# Patient Record
Sex: Female | Born: 1965 | Hispanic: No | Marital: Married | Smoking: Never smoker
Health system: Southern US, Community
[De-identification: ages and names within clinical notes are randomized; demographics above are authoritative.]

## PROBLEM LIST (undated history)

## (undated) DIAGNOSIS — N6459 Other signs and symptoms in breast: Secondary | ICD-10-CM

## (undated) DIAGNOSIS — E7800 Pure hypercholesterolemia, unspecified: Secondary | ICD-10-CM

## (undated) DIAGNOSIS — M1990 Unspecified osteoarthritis, unspecified site: Secondary | ICD-10-CM

## (undated) HISTORY — PX: KNEE SURGERY: SHX244

---

## 2005-08-03 ENCOUNTER — Inpatient Hospital Stay (HOSPITAL_COMMUNITY): Admission: AD | Admit: 2005-08-03 | Discharge: 2005-08-03 | Payer: Self-pay | Admitting: Obstetrics and Gynecology

## 2006-01-06 ENCOUNTER — Ambulatory Visit (HOSPITAL_COMMUNITY): Admission: RE | Admit: 2006-01-06 | Discharge: 2006-01-06 | Payer: Self-pay | Admitting: Obstetrics & Gynecology

## 2006-03-01 ENCOUNTER — Inpatient Hospital Stay (HOSPITAL_COMMUNITY): Admission: AD | Admit: 2006-03-01 | Discharge: 2006-03-05 | Payer: Self-pay | Admitting: *Deleted

## 2006-09-22 ENCOUNTER — Emergency Department (HOSPITAL_COMMUNITY): Admission: EM | Admit: 2006-09-22 | Discharge: 2006-09-22 | Payer: Self-pay | Admitting: Family Medicine

## 2006-09-30 ENCOUNTER — Emergency Department (HOSPITAL_COMMUNITY): Admission: EM | Admit: 2006-09-30 | Discharge: 2006-09-30 | Payer: Self-pay | Admitting: Emergency Medicine

## 2006-10-21 ENCOUNTER — Ambulatory Visit (HOSPITAL_COMMUNITY): Admission: RE | Admit: 2006-10-21 | Discharge: 2006-10-21 | Payer: Self-pay | Admitting: Family Medicine

## 2007-01-27 ENCOUNTER — Emergency Department (HOSPITAL_COMMUNITY): Admission: EM | Admit: 2007-01-27 | Discharge: 2007-01-28 | Payer: Self-pay | Admitting: Emergency Medicine

## 2007-07-18 ENCOUNTER — Emergency Department (HOSPITAL_COMMUNITY): Admission: EM | Admit: 2007-07-18 | Discharge: 2007-07-18 | Payer: Self-pay | Admitting: Emergency Medicine

## 2008-04-04 ENCOUNTER — Ambulatory Visit (HOSPITAL_BASED_OUTPATIENT_CLINIC_OR_DEPARTMENT_OTHER): Admission: RE | Admit: 2008-04-04 | Discharge: 2008-04-04 | Payer: Self-pay | Admitting: Orthopaedic Surgery

## 2008-05-02 ENCOUNTER — Ambulatory Visit: Payer: Self-pay | Admitting: Internal Medicine

## 2008-05-02 ENCOUNTER — Ambulatory Visit: Payer: Self-pay | Admitting: *Deleted

## 2008-05-04 ENCOUNTER — Encounter: Admission: RE | Admit: 2008-05-04 | Discharge: 2008-06-14 | Payer: Self-pay | Admitting: Orthopaedic Surgery

## 2008-09-19 ENCOUNTER — Emergency Department (HOSPITAL_COMMUNITY): Admission: EM | Admit: 2008-09-19 | Discharge: 2008-09-19 | Payer: Self-pay | Admitting: Emergency Medicine

## 2009-02-11 ENCOUNTER — Ambulatory Visit: Payer: Self-pay | Admitting: Internal Medicine

## 2009-04-04 ENCOUNTER — Ambulatory Visit: Payer: Self-pay | Admitting: Internal Medicine

## 2009-04-12 ENCOUNTER — Ambulatory Visit: Payer: Self-pay | Admitting: Internal Medicine

## 2009-04-18 ENCOUNTER — Ambulatory Visit (HOSPITAL_COMMUNITY): Admission: RE | Admit: 2009-04-18 | Discharge: 2009-04-18 | Payer: Self-pay | Admitting: Family Medicine

## 2009-04-26 ENCOUNTER — Encounter: Admission: RE | Admit: 2009-04-26 | Discharge: 2009-04-26 | Payer: Self-pay | Admitting: Family Medicine

## 2009-07-10 ENCOUNTER — Ambulatory Visit: Payer: Self-pay | Admitting: Internal Medicine

## 2009-07-10 ENCOUNTER — Encounter: Payer: Self-pay | Admitting: Family Medicine

## 2009-07-10 LAB — CONVERTED CEMR LAB
ALT: 14 units/L (ref 0–35)
AST: 17 units/L (ref 0–37)
Albumin: 4.7 g/dL (ref 3.5–5.2)
Alkaline Phosphatase: 67 units/L (ref 39–117)
BUN: 14 mg/dL (ref 6–23)
CO2: 22 meq/L (ref 19–32)
Calcium: 9.1 mg/dL (ref 8.4–10.5)
Chloride: 103 meq/L (ref 96–112)
Cholesterol: 214 mg/dL — ABNORMAL HIGH (ref 0–200)
Creatinine, Ser: 0.63 mg/dL (ref 0.40–1.20)
Glucose, Bld: 81 mg/dL (ref 70–99)
HDL: 38 mg/dL — ABNORMAL LOW (ref >39)
LDL Cholesterol: 139 mg/dL — ABNORMAL HIGH (ref 0–99)
Potassium: 4.1 meq/L (ref 3.5–5.3)
Sodium: 139 meq/L (ref 135–145)
Total Bilirubin: 0.6 mg/dL (ref 0.3–1.2)
Total CHOL/HDL Ratio: 5.6
Total Protein: 7.7 g/dL (ref 6.0–8.3)
Triglycerides: 184 mg/dL — ABNORMAL HIGH (ref <150)
VLDL: 37 mg/dL (ref 0–40)

## 2009-07-17 ENCOUNTER — Ambulatory Visit: Payer: Self-pay | Admitting: Internal Medicine

## 2009-07-17 ENCOUNTER — Encounter: Payer: Self-pay | Admitting: Family Medicine

## 2009-07-17 LAB — CONVERTED CEMR LAB
Chlamydia, DNA Probe: NEGATIVE
GC Probe Amp, Genital: NEGATIVE

## 2009-10-17 ENCOUNTER — Encounter: Payer: Self-pay | Admitting: Internal Medicine

## 2009-10-17 ENCOUNTER — Ambulatory Visit: Payer: Self-pay | Admitting: Internal Medicine

## 2009-10-17 LAB — CONVERTED CEMR LAB
Cholesterol: 197 mg/dL (ref 0–200)
HDL: 35 mg/dL — ABNORMAL LOW (ref >39)
LDL Cholesterol: 132 mg/dL — ABNORMAL HIGH (ref 0–99)
Total CHOL/HDL Ratio: 5.6
Triglycerides: 151 mg/dL — ABNORMAL HIGH (ref <150)
VLDL: 30 mg/dL (ref 0–40)

## 2009-10-30 ENCOUNTER — Encounter: Admission: RE | Admit: 2009-10-30 | Discharge: 2009-10-30 | Payer: Self-pay | Admitting: Family Medicine

## 2009-11-12 ENCOUNTER — Ambulatory Visit: Payer: Self-pay | Admitting: Family Medicine

## 2009-12-24 ENCOUNTER — Telehealth (INDEPENDENT_AMBULATORY_CARE_PROVIDER_SITE_OTHER): Payer: Self-pay | Admitting: *Deleted

## 2009-12-25 ENCOUNTER — Ambulatory Visit: Payer: Self-pay | Admitting: Internal Medicine

## 2010-01-17 ENCOUNTER — Telehealth (INDEPENDENT_AMBULATORY_CARE_PROVIDER_SITE_OTHER): Payer: Self-pay | Admitting: *Deleted

## 2010-01-20 ENCOUNTER — Ambulatory Visit: Payer: Self-pay | Admitting: Internal Medicine

## 2010-02-11 ENCOUNTER — Ambulatory Visit: Payer: Self-pay | Admitting: Internal Medicine

## 2010-02-11 LAB — CONVERTED CEMR LAB: Direct LDL: 135 mg/dL — ABNORMAL HIGH

## 2010-06-19 ENCOUNTER — Encounter: Admission: RE | Admit: 2010-06-19 | Discharge: 2010-06-19 | Payer: Self-pay | Admitting: Family Medicine

## 2010-07-10 ENCOUNTER — Encounter (INDEPENDENT_AMBULATORY_CARE_PROVIDER_SITE_OTHER): Payer: Self-pay | Admitting: Family Medicine

## 2010-07-10 LAB — CONVERTED CEMR LAB
Free T4: 1.34 ng/dL (ref 0.80–1.80)
Sed Rate: 10 mm/hr (ref 0–22)
TSH: 1.189 microintl units/mL (ref 0.350–4.500)
Vit D, 25-Hydroxy: 17 ng/mL — ABNORMAL LOW (ref 30–89)

## 2010-11-25 NOTE — Progress Notes (Signed)
Summary: triage/diarrhea  Phone Note Call from Patient   Reason for Call: Talk to Nurse Summary of Call: patient states has been having watery diarrhea 2-3 times a day since starting Z pak on 3/2.Marland Kitchen.(3 weeks)...she has tried Immodium and it has not helped.Marland KitchenMarland KitchenPatient states her stomach is bloated..She denies dizziness or other signs of dehydration.Marland KitchenMarland KitchenShe denies fever, blood or pus in stool.Marland KitchenMarland KitchenAdvised patient to drink plenty of water and that if she is experiencing any dizziness or weakness to have someone take her to ED.Marland KitchenAppointment made in Dr. Paticia Stack schedule for Monday... Initial call taken by: Conchita Paris,  January 17, 2010 11:32 AM

## 2010-11-25 NOTE — Progress Notes (Signed)
Summary: triage/cough sob 1 week  Phone Note Call from Patient   Caller: Patient Reason for Call: Talk to Nurse Summary of Call: patient states she has been sick with the flu for 1 week and her cough is worse..She states the cough is non productive and her chest hurts all the time..She Doesn't think she has a fever..She says she has difficulty breathing.Marland KitchenMarland KitchenShe is obese and denies DM, HTN or other problems.Marland KitchenMarland KitchenShe  is not speaking in full sentences and is coughing while on phone.Marland KitchenMarland KitchenAdvised patient she can go to urgent care this evening if needed.Marland KitchenMarland KitchenAppointment made with Dr. Reche Dixon tomorrow.Marland Kitchen

## 2010-12-19 ENCOUNTER — Other Ambulatory Visit (HOSPITAL_COMMUNITY)
Admission: RE | Admit: 2010-12-19 | Discharge: 2010-12-19 | Disposition: A | Discharge status: Home / Self Care | Payer: 59 | Source: Ambulatory Visit | Attending: Family Medicine | Admitting: Family Medicine

## 2010-12-19 ENCOUNTER — Other Ambulatory Visit: Payer: Self-pay | Admitting: Family Medicine

## 2010-12-19 DIAGNOSIS — Z01419 Encounter for gynecological examination (general) (routine) without abnormal findings: Secondary | ICD-10-CM | POA: Insufficient documentation

## 2011-03-10 NOTE — Op Note (Signed)
Cheryl Bush, Cheryl Bush              ACCOUNT NO.:  [id number]   MEDICAL RECORD NO.:  [id number]          PATIENT TYPE:  AMB   LOCATION:  [facility]SC                          FACILITY:  [facility]   PHYSICIAN:  Mark C. Ophelia Charter, M.D.    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   PROCEDURES:  Left knee arthroscopy, exam under anesthesia, partial  medial meniscectomy.  Medial compartment chondroplasty, patellar  chondral debridement.   SURGEON:  Mark C. Ophelia Charter, MD   ANESTHESIA:  MAC plus 20 mL local.   PROCEDURE:  After induction of anesthesia, proximal thigh tourniquet and  leg holder, a standard prep and drape, in usual impervious stockinette,  Coban and arthroscopic sheets and drapes were applied, and placed  through superolateral portal.  After time-out procedure, check list was  completed.  Insufflation with the pump at 70.  Medial and lateral  parapatellar tendon portals were used.  Exam of the knee under  anesthesia demonstrate normal cruciate ligaments, collateral ligaments  were stable, normal patellar tracking.  The patellofemoral joint was  inspected.  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  Trochlear groove looked good.  Lateral  compartment was pristine.  Medial compartment showed degeneration of the  medial femoral condyle with grade 3 chondromalacia more prominent and  full extension, grade 2-3 changes on the tibial side and a tear of the  posterior meniscus with undersurface tear with a flat piece.  This was  painful with palpation for some initial anesthetic was given.  Using  combination of small straight flat baskets and a shaver,  partial medial  meniscectomy was performed.  Rim was stable.  Photographs were taken.  ACL and PCL was normal.  Lateral compartment was pristine.   ASSESSMENT:  Degenerative medial compartment with medial meniscal tear.  Knee was suctioned dry.  Tincture of benzoin, Steri-Strips, Tegaderm, 4  x 4's, ABD, Webril, and 6-inch Ace wrap x2 was applied.  Outpatient  surgery is appropriate for treatment of this condition.  Office follow  up in 1 week.      Mark C. Ophelia Charter, M.D.  Electronically Signed     MCY/MEDQ  D:  04/04/2008  T:  04/05/2008  Job:  [id number]

## 2011-07-23 LAB — POCT HEMOGLOBIN-HEMACUE: Hemoglobin: 14.5

## 2012-01-25 ENCOUNTER — Other Ambulatory Visit: Payer: Self-pay | Admitting: Family Medicine

## 2012-01-25 DIAGNOSIS — Z1231 Encounter for screening mammogram for malignant neoplasm of breast: Secondary | ICD-10-CM

## 2012-02-08 ENCOUNTER — Ambulatory Visit
Admission: RE | Admit: 2012-02-08 | Discharge: 2012-02-08 | Disposition: A | Discharge status: Home / Self Care | Payer: 59 | Source: Ambulatory Visit | Attending: Family Medicine | Admitting: Family Medicine

## 2012-02-08 DIAGNOSIS — Z1231 Encounter for screening mammogram for malignant neoplasm of breast: Secondary | ICD-10-CM

## 2012-08-11 ENCOUNTER — Other Ambulatory Visit: Payer: Self-pay | Admitting: Obstetrics & Gynecology

## 2012-08-11 DIAGNOSIS — E28319 Asymptomatic premature menopause: Secondary | ICD-10-CM

## 2012-08-17 ENCOUNTER — Ambulatory Visit (HOSPITAL_COMMUNITY): Admission: RE | Admit: 2012-08-17 | Payer: 59 | Source: Ambulatory Visit

## 2012-09-02 ENCOUNTER — Ambulatory Visit (HOSPITAL_COMMUNITY)
Admission: RE | Admit: 2012-09-02 | Discharge: 2012-09-02 | Disposition: A | Discharge status: Home / Self Care | Payer: 59 | Source: Ambulatory Visit | Attending: Obstetrics & Gynecology | Admitting: Obstetrics & Gynecology

## 2012-09-02 ENCOUNTER — Ambulatory Visit (HOSPITAL_COMMUNITY): Admission: RE | Admit: 2012-09-02 | Payer: 59 | Source: Ambulatory Visit

## 2012-09-02 DIAGNOSIS — Z13820 Encounter for screening for osteoporosis: Secondary | ICD-10-CM | POA: Insufficient documentation

## 2012-09-02 DIAGNOSIS — E28319 Asymptomatic premature menopause: Secondary | ICD-10-CM | POA: Insufficient documentation

## 2013-03-07 ENCOUNTER — Other Ambulatory Visit: Payer: Self-pay

## 2013-03-12 ENCOUNTER — Emergency Department (HOSPITAL_COMMUNITY)
Admission: EM | Admit: 2013-03-12 | Discharge: 2013-03-12 | Disposition: A | Discharge status: Home / Self Care | Payer: 59 | Attending: Emergency Medicine | Admitting: Emergency Medicine

## 2013-03-12 ENCOUNTER — Encounter (HOSPITAL_COMMUNITY): Payer: Self-pay

## 2013-03-12 DIAGNOSIS — N611 Abscess of the breast and nipple: Secondary | ICD-10-CM

## 2013-03-12 DIAGNOSIS — N61 Mastitis without abscess: Secondary | ICD-10-CM | POA: Insufficient documentation

## 2013-03-12 DIAGNOSIS — E7800 Pure hypercholesterolemia, unspecified: Secondary | ICD-10-CM | POA: Insufficient documentation

## 2013-03-12 DIAGNOSIS — B379 Candidiasis, unspecified: Secondary | ICD-10-CM

## 2013-03-12 HISTORY — DX: Pure hypercholesterolemia, unspecified: E78.00

## 2013-03-12 LAB — BASIC METABOLIC PANEL
BUN: 13 mg/dL (ref 6–23)
Creatinine, Ser: 0.7 mg/dL (ref 0.50–1.10)
GFR calc Af Amer: >90 mL/min (ref >90)
GFR calc non Af Amer: >90 mL/min (ref >90)
Glucose, Bld: 122 mg/dL — ABNORMAL HIGH (ref 70–99)

## 2013-03-12 LAB — CBC WITH DIFFERENTIAL/PLATELET
Basophils Absolute: 0 10*3/uL (ref 0.0–0.1)
Eosinophils Absolute: 0.1 10*3/uL (ref 0.0–0.7)
HCT: 40.4 % (ref 36.0–46.0)
Lymphs Abs: 1 10*3/uL (ref 0.7–4.0)
MCH: 29.9 pg (ref 26.0–34.0)
MCHC: 35.9 g/dL (ref 30.0–36.0)
MCV: 83.3 fL (ref 78.0–100.0)
Monocytes Absolute: 0.4 10*3/uL (ref 0.1–1.0)
Neutro Abs: 1.7 10*3/uL (ref 1.7–7.7)
RDW: 12.2 % (ref 11.5–15.5)

## 2013-03-12 MED ORDER — FLUCONAZOLE 100 MG PO TABS: 100.0000 mg | ORAL_TABLET | Freq: Every day | ORAL | Status: AC

## 2013-03-12 MED ORDER — MORPHINE SULFATE 4 MG/ML IJ SOLN
4.0000 mg | Freq: Once | INTRAMUSCULAR | Status: AC
  Administered 2013-03-12: 4 mg via INTRAVENOUS
  Filled 2013-03-12: qty 1

## 2013-03-12 MED ORDER — HYDROCODONE-ACETAMINOPHEN 5-325 MG PO TABS: 1.0000 | ORAL_TABLET | ORAL | Status: DC | PRN

## 2013-03-12 MED ORDER — VANCOMYCIN HCL IN DEXTROSE 1-5 GM/200ML-% IV SOLN
1000.0000 mg | Freq: Once | INTRAVENOUS | Status: AC
  Administered 2013-03-12: 1000 mg via INTRAVENOUS
  Filled 2013-03-12: qty 200

## 2013-03-12 MED ORDER — SODIUM CHLORIDE 0.9 % IV SOLN
INTRAVENOUS | Status: DC
  Administered 2013-03-12: 17:00:00 via INTRAVENOUS

## 2013-03-12 MED ORDER — ONDANSETRON HCL 4 MG/2ML IJ SOLN
4.0000 mg | Freq: Once | INTRAMUSCULAR | Status: AC
  Administered 2013-03-12: 4 mg via INTRAVENOUS
  Filled 2013-03-12: qty 2

## 2013-03-12 NOTE — ED Notes (Signed)
MD-Dr. Pollyann Kennedy at bedside.

## 2013-03-12 NOTE — ED Provider Notes (Signed)
Medical screening examination/treatment/procedure(s) were performed by non-physician practitioner and as supervising physician I was immediately available for consultation/collaboration.   Jermisha Hoffart, MD 03/12/13 2341 

## 2013-03-12 NOTE — ED Notes (Signed)
[id number]<JY> Expected date:<BR> Expected time:<BR> Means of arrival:<BR> Comments:<BR> Hold for triage-private pain

## 2013-03-12 NOTE — Consult Note (Signed)
Reason for Consult:  Left breast abscess Referring Physician:  Dr. [name] is an 47 y.o. female from [place] originally.  She moved to the [place] in 2002. HPI: She developed a small area of swelling and inferior to the nipple areolar complex of the left breast 6 days ago. It began getting larger. She saw her primary care physician. She was started on Augmentin. Despite that, the area continued to enlarge. She subsequently was started on doxycycline. Today she had increasing pain with nausea. She presented to the emergency department for evaluation. Of note was that she tried to place a hot compress over the area to "kill the bacteria" and ended up getting a skin burn in the inferior aspect of the left breast because of that.  She has a history of breast cancer in some of her cousins.  Past Medical History  Diagnosis Date  . High cholesterol     Past Surgical History  Procedure Laterality Date  . Knee surgery      left    No family history on file.  Social History:  reports that she has never smoked. She does not have any smokeless tobacco history on file. She reports that she does not drink alcohol or use illicit drugs.  Allergies: No Known Allergies  Prior to Admission medications   Medication Sig Start Date End Date Taking? Authorizing Provider  amoxicillin-clavulanate (AUGMENTIN) 875-125 MG per tablet Take 1 tablet by mouth 2 (two) times daily. For 10 days   Yes Historical Provider, MD  doxycycline (VIBRAMYCIN) 100 MG capsule Take 100 mg by mouth 2 (two) times daily. For 10 days   Yes Historical Provider, MD     Results for orders placed during the hospital encounter of 03/12/13 (from the past 48 hour(s))  CBC WITH DIFFERENTIAL     Status: Abnormal (Preliminary result)   Collection Time    03/12/13  4:40 PM      Result Value Range   WBC 3.2 (*) 4.0 - 10.5 K/uL   RBC 4.85  3.87 - 5.11 MIL/uL   Hemoglobin 14.5  12.0 - 15.0 g/dL   HCT 16.1  09.6 - 04.5 %   MCV 83.3   78.0 - 100.0 fL   MCH 29.9  26.0 - 34.0 pg   MCHC 35.9  30.0 - 36.0 g/dL   RDW 40.9  81.1 - 91.4 %   Platelets 201  150 - 400 K/uL   Neutrophils Relative % PENDING  43 - 77 %   Neutro Abs PENDING  1.7 - 7.7 K/uL   Band Neutrophils PENDING  0 - 10 %   Lymphocytes Relative PENDING  12 - 46 %   Lymphs Abs PENDING  0.7 - 4.0 K/uL   Monocytes Relative PENDING  3 - 12 %   Monocytes Absolute PENDING  0.1 - 1.0 K/uL   Eosinophils Relative PENDING  0 - 5 %   Eosinophils Absolute PENDING  0.0 - 0.7 K/uL   Basophils Relative PENDING  0 - 1 %   Basophils Absolute PENDING  0.0 - 0.1 K/uL   WBC Morphology PENDING     RBC Morphology PENDING     Smear Review PENDING     nRBC PENDING  0 /100 WBC   Metamyelocytes Relative PENDING     Myelocytes PENDING     Promyelocytes Absolute PENDING     Blasts PENDING    BASIC METABOLIC PANEL     Status: Abnormal   Collection Time  03/12/13  4:40 PM      Result Value Range   Sodium 141  135 - 145 mEq/L   Potassium 4.0  3.5 - 5.1 mEq/L   Chloride 105  96 - 112 mEq/L   CO2 25  19 - 32 mEq/L   Glucose, Bld 122 (*) 70 - 99 mg/dL   BUN 13  6 - 23 mg/dL   Creatinine, Ser 2.95  0.50 - 1.10 mg/dL   Calcium 9.5  8.4 - 28.4 mg/dL   GFR calc non Af Amer >90  >90 mL/min   GFR calc Af Amer >90  >90 mL/min   Comment:            The eGFR has been calculated     using the CKD EPI equation.     This calculation has not been     validated in all clinical     situations.     eGFR's persistently     <90 mL/min signify     possible Chronic Kidney Disease.    No results found.  Review of Systems  Constitutional: Positive for fever and chills.  Gastrointestinal: Positive for nausea. Negative for abdominal pain.  Genitourinary:       She is having vaginal itching and a discharge since starting the antibiotic.   Blood pressure 157/94, pulse 106, temperature 98.8 F (37.1 C), temperature source Oral, resp. rate 18, last menstrual period 03/08/2013, SpO2  99.00%. Physical Exam  Constitutional: She appears well-developed and well-nourished. No distress.  Neck: Neck supple.  Respiratory:  Left breast-a second-degree burn is present measuring approximately 3-4 cm in the inferior aspect of the left breast. From the 3 to 6:00 position of the nipple areolar complex, and erythematous fluctuant area is palpable and is tender.  The right breast demonstrates no masses or suspicious skin changes.  Musculoskeletal:  No axillary or supraclavicular adenopathy.  Lymphadenopathy:    She has no cervical adenopathy.  Psychiatric: She has a normal mood and affect. Her behavior is normal.    Assessment/Plan: 1. Left breast abscess that has not responded to antibiotic therapy.  2. Fungal vaginitis likely related to antibiotic.  Plan: Incision and drainage of a left breast abscess. We'll have her get vancomycin x1 dose in the emergency department. Will have her continue the doxycycline at home. We'll give her a prescription for Diflucan as well as hydrocodone. We'll have her followup in the office in one to 2 weeks.  Cheryl Bush 03/12/2013, 5:36 PM

## 2013-03-12 NOTE — ED Notes (Addendum)
Pt states she is being treated for a breast infection (left side) with antiobiotics but it is getting worse.  States the breast infection began Monday (6 days ago).  Also c/o burning pain to vaginal area when she cleans herself in the shower.  Pt also states she used hot water to clean area and has a blister to the breast as well.

## 2013-03-12 NOTE — ED Provider Notes (Signed)
History     CSN: [id number]  Arrival date & time 03/12/13  1545   First MD Initiated Contact with Patient 03/12/13 1555      Chief Complaint  Patient presents with  . Breast Problem    (Consider location/radiation/quality/duration/timing/severity/associated sxs/prior treatment) HPI  Patient presents to the ED with complaints of left breast pain and vaginal burning. She developed a left breast infection on Monday and has tried two antibiotics written by her PCP at [facility] - Augmentin and Doxy. The infection continues to worsen and now she claims it grows and the pain worsens by the minute. The pain in the breast is severe. She says that this morning while taking a shower the warm water made her feel some burning in her vaginal region with a small amount of white discharge from the area. She endorses having some nausea and subjective fevers.   Past Medical History  Diagnosis Date  . High cholesterol     Past Surgical History  Procedure Laterality Date  . Knee surgery      left    No family history on file.  History  Substance Use Topics  . Smoking status: Never Smoker   . Smokeless tobacco: Not on file  . Alcohol Use: No    OB History   Grav Para Term Preterm Abortions TAB SAB Ect Mult Living                  Review of Systems  All other systems reviewed and are negative.    Allergies  Review of patient's allergies indicates no known allergies.  Home Medications   Current Outpatient Rx  Name  Route  Sig  Dispense  Refill  . doxycycline (VIBRAMYCIN) 100 MG capsule   Oral   Take 100 mg by mouth 2 (two) times daily. For 10 days         . fluconazole (DIFLUCAN) 100 MG tablet   Oral   Take 1 tablet (100 mg total) by mouth daily.   2 tablet   0   . HYDROcodone-acetaminophen (NORCO) 5-325 MG per tablet   Oral   Take 1-2 tablets by mouth every 4 (four) hours as needed for pain.   30 tablet   1     BP 157/94  Pulse 106  Temp(Src)  98.8 F (37.1 C) (Oral)  Resp 18  SpO2 99%  LMP 03/08/2013  Physical Exam  Nursing note and vitals reviewed. Constitutional: She appears well-developed and well-nourished. No distress.  HENT:  Head: Normocephalic and atraumatic.  Eyes: Pupils are equal, round, and reactive to light.  Neck: Normal range of motion. Neck supple.  Cardiovascular: Normal rate and regular rhythm.   Pulmonary/Chest: Effort normal.    Abdominal: Soft.  Genitourinary:  Due to her muslim religious beliefs, the patient declines pelvic at this time.  Neurological: She is alert.  Skin: Skin is warm and dry.    ED Course  Procedures (including critical care time)  Labs Reviewed  CBC WITH DIFFERENTIAL - Abnormal; Notable for the following:    WBC 3.2 (*)    All other components within normal limits  BASIC METABOLIC PANEL - Abnormal; Notable for the following:    Glucose, Bld 122 (*)    All other components within normal limits   No results found.   1. Breast abscess of female   2. Yeast infection       MDM  Patients abscess is significant. I spoke with Dr. Manus Gunning who  says it is okay if I call and discuss with surgery.   I spoke with Dr. Pollyann Kennedy who says he will come to the ER and see the patient. Will attempt IandD in the ER and if he needs take her to the OR.  He requests bedside IandD tray, quarter inch penrose drain, and chromic suture. Patient is be NPO, given pain medication and vancomycin. IV started, fluids going in and basic labs drawn.  Dr. Pollyann Kennedy has finished procedure in the ER and discharged patient. On Diflucan and Norco. Will follow up with PCP at [facility].  Pt has been advised of the symptoms that warrant their return to the ED. Patient has voiced understanding and has agreed to follow-up with the PCP or specialist.     Dorthula Matas, PA-C 03/12/13 1805

## 2013-03-12 NOTE — Op Note (Signed)
Operative Note  Cheryl Bush female 47 y.o. 03/12/2013  PREOPERATIVE DX:  Left breast abscess  POSTOPERATIVE DX:  Same  PROCEDURE:  Complex incision and drainage of left breast abscess         Surgeon: Adolph Pollack   Assistants: none  Anesthesia: Local anesthesia 1% plain lidocaine  Indications: This is a 47 year old female who has an increasing left breast abscess despite antibiotic treatment as an outpatient. She presented to the emergency department. Incision and drainage is indicated.    Procedure Detail:  The left breast was sterilely prepped and draped. Just lateral to the nipple areola complex local anesthetic was infiltrated over the fluctuant indurated area. Using a 19-gauge needle I aspirated this area and purulent material returned. I subsequently made an incision from the 3 to 5:00 position around the nipple areolar complex and drained purulent material. Using a hemostat, I broke up loculations. The abscess cavity tracked inferiorly. Once I felt I had adequate drainage, I placed a quarter inch Penrose drain into the wound and anchored to the skin with 4-0 chromic suture. A bulky dressing was applied.  She tolerated the procedure well. Her family was present. I explained to them that they would need to change the dressing daily.  There were no apparent complications. She will be discharged from the emergency department. She will follow up in the office in one to 2 weeks.

## 2013-03-15 ENCOUNTER — Ambulatory Visit (INDEPENDENT_AMBULATORY_CARE_PROVIDER_SITE_OTHER): Payer: 59 | Admitting: General Surgery

## 2013-03-15 ENCOUNTER — Encounter (INDEPENDENT_AMBULATORY_CARE_PROVIDER_SITE_OTHER): Payer: Self-pay | Admitting: General Surgery

## 2013-03-15 ENCOUNTER — Encounter (INDEPENDENT_AMBULATORY_CARE_PROVIDER_SITE_OTHER): Payer: Self-pay

## 2013-03-15 VITALS — BP 144/94 | HR 102 | Temp 98.6°F | Resp 16 | Ht 62.0 in | Wt 159.2 lb

## 2013-03-15 DIAGNOSIS — N61 Mastitis without abscess: Secondary | ICD-10-CM

## 2013-03-15 DIAGNOSIS — N611 Abscess of the breast and nipple: Secondary | ICD-10-CM

## 2013-03-15 NOTE — Progress Notes (Signed)
Procedure:  Incision and drainage of left breast abscess  Date:  03/12/2013  Pathology: na  History:  She is here for short term followup. She still has some discomfort and firmness in the area.  Exam: General- Is in NAD. Left breast-erythema has resolved. Second degree burns total present but is better. Penrose drain is removed. No purulent drainage present. Induration is present. Wound was irrigated. Sterile dressing was applied the  Assessment:  Complex left breast abscess status post incision and drainage. Also has second-degree burn. Both are improving. She is on doxycycline.  Plan:  I've instructed her to clean the wound with warm water twice a day and apply dry bandage. She is to continue the doxycycline. Return visit one week.

## 2013-03-15 NOTE — Patient Instructions (Signed)
Clean area with warm water twice a day.

## 2013-03-16 ENCOUNTER — Other Ambulatory Visit: Payer: Self-pay | Admitting: Family Medicine

## 2013-03-16 DIAGNOSIS — N63 Unspecified lump in unspecified breast: Secondary | ICD-10-CM

## 2013-03-22 ENCOUNTER — Encounter (INDEPENDENT_AMBULATORY_CARE_PROVIDER_SITE_OTHER): Payer: Self-pay

## 2013-03-22 ENCOUNTER — Ambulatory Visit (INDEPENDENT_AMBULATORY_CARE_PROVIDER_SITE_OTHER): Payer: 59 | Admitting: General Surgery

## 2013-03-22 ENCOUNTER — Other Ambulatory Visit: Payer: 59

## 2013-03-22 ENCOUNTER — Encounter (INDEPENDENT_AMBULATORY_CARE_PROVIDER_SITE_OTHER): Payer: Self-pay | Admitting: General Surgery

## 2013-03-22 VITALS — BP 124/74 | HR 78 | Temp 98.0°F | Resp 18 | Ht 62.0 in | Wt 162.0 lb

## 2013-03-22 DIAGNOSIS — N61 Mastitis without abscess: Secondary | ICD-10-CM

## 2013-03-22 DIAGNOSIS — T2121XD Burn of second degree of chest wall, subsequent encounter: Secondary | ICD-10-CM

## 2013-03-22 DIAGNOSIS — T2121XA Burn of second degree of chest wall, initial encounter: Secondary | ICD-10-CM | POA: Insufficient documentation

## 2013-03-22 DIAGNOSIS — N611 Abscess of the breast and nipple: Secondary | ICD-10-CM

## 2013-03-22 NOTE — Progress Notes (Signed)
Procedure:  Incision and drainage of left breast abscess  Date:  03/12/2013  Pathology: na  History: She is here now for another followup visit of her left breast abscess and her second degree burn to the inferior left breast.  Exam: General- Is in NAD. Left breast-The wound has healed. There is still some firmness deep to the wound. No erythema. The second degree burn is improving. There are some open areas that are present.  Neosporin and dry dressing were applied to these areas.  Assessment:  Complex left breast abscess status post incision and drainage-Wound has healed. Still some swelling inferior to the wound. Second degree burn is improving.  Plan: May return to work in one week. Apply antibiotic ointment and dry dressing to second degree burn areas daily. Return visit in 2 weeks.

## 2013-03-22 NOTE — Patient Instructions (Signed)
Apply antibiotic ointment to burn on left breast and a dry dressing daily. Wear a soft bra.  May return to work next Wednesday. Go for your mammogram tomorrow.

## 2013-03-23 ENCOUNTER — Ambulatory Visit
Admission: RE | Admit: 2013-03-23 | Discharge: 2013-03-23 | Disposition: A | Discharge status: Home / Self Care | Payer: 59 | Source: Ambulatory Visit | Attending: Family Medicine | Admitting: Family Medicine

## 2013-03-23 ENCOUNTER — Other Ambulatory Visit: Payer: Self-pay | Admitting: Family Medicine

## 2013-03-23 DIAGNOSIS — N63 Unspecified lump in unspecified breast: Secondary | ICD-10-CM

## 2013-04-05 ENCOUNTER — Encounter (INDEPENDENT_AMBULATORY_CARE_PROVIDER_SITE_OTHER): Payer: Self-pay | Admitting: General Surgery

## 2013-04-05 ENCOUNTER — Ambulatory Visit (INDEPENDENT_AMBULATORY_CARE_PROVIDER_SITE_OTHER): Payer: 59 | Admitting: General Surgery

## 2013-04-05 VITALS — BP 120/76 | HR 76 | Temp 97.2°F | Resp 18 | Ht 62.0 in | Wt 164.8 lb

## 2013-04-05 DIAGNOSIS — T2121XD Burn of second degree of chest wall, subsequent encounter: Secondary | ICD-10-CM

## 2013-04-05 DIAGNOSIS — T2121XA Burn of second degree of chest wall, initial encounter: Secondary | ICD-10-CM

## 2013-04-05 DIAGNOSIS — Z5189 Encounter for other specified aftercare: Secondary | ICD-10-CM

## 2013-04-05 NOTE — Progress Notes (Signed)
Procedure:  Incision and drainage of left breast abscess  Date:  03/12/2013  Pathology: na  History: She is here now for another followup visit of her left breast abscess and her second degree burn to the inferior left breast.  Mammogram and ultrasound did not show any evidence of malignancy.  Exam: General- Is in NAD. Left breast-The induration is nearly completely resolved. The burn site looks significantly better as well.  Assessment:  Complex left breast abscess status post incision and drainage and second degree burn-Significant improvement is noted. Plan: Return visit in 3 months to document complete resolution.

## 2013-04-05 NOTE — Patient Instructions (Signed)
Will check you again in 3 months.

## 2013-05-29 ENCOUNTER — Encounter (INDEPENDENT_AMBULATORY_CARE_PROVIDER_SITE_OTHER): Payer: Self-pay | Admitting: General Surgery

## 2013-06-07 ENCOUNTER — Other Ambulatory Visit (INDEPENDENT_AMBULATORY_CARE_PROVIDER_SITE_OTHER): Payer: Self-pay | Admitting: General Surgery

## 2013-06-07 ENCOUNTER — Ambulatory Visit
Admission: RE | Admit: 2013-06-07 | Discharge: 2013-06-07 | Disposition: A | Discharge status: Home / Self Care | Payer: 59 | Source: Ambulatory Visit | Attending: General Surgery | Admitting: General Surgery

## 2013-06-07 ENCOUNTER — Ambulatory Visit (INDEPENDENT_AMBULATORY_CARE_PROVIDER_SITE_OTHER): Payer: 59 | Admitting: General Surgery

## 2013-06-07 ENCOUNTER — Encounter (INDEPENDENT_AMBULATORY_CARE_PROVIDER_SITE_OTHER): Payer: Self-pay | Admitting: General Surgery

## 2013-06-07 VITALS — BP 124/78 | HR 76 | Temp 98.1°F | Resp 16 | Ht 62.0 in | Wt 160.8 lb

## 2013-06-07 DIAGNOSIS — N63 Unspecified lump in unspecified breast: Secondary | ICD-10-CM | POA: Insufficient documentation

## 2013-06-07 DIAGNOSIS — N6320 Unspecified lump in the left breast, unspecified quadrant: Secondary | ICD-10-CM

## 2013-06-07 MED ORDER — DOXYCYCLINE HYCLATE 100 MG PO TABS: 100.0000 mg | ORAL_TABLET | Freq: Two times a day (BID) | ORAL | Status: DC

## 2013-06-07 MED ORDER — TRAMADOL HCL 50 MG PO TABS: 50.0000 mg | ORAL_TABLET | Freq: Four times a day (QID) | ORAL | Status: DC | PRN

## 2013-06-07 NOTE — Patient Instructions (Signed)
Take antibiotic as directed.

## 2013-06-07 NOTE — Progress Notes (Signed)
Patient ID: Cheryl Bush, female   DOB: 1966/09/20, 47 y.o.   MRN: [id number]  Chief Complaint  Patient presents with  . New Evaluation    eval Lt br swelling    HPI Cheryl Bush is a 47 y.o. female.   HPI  She is self-referred and reports over the past 6 days she has a painful swelling in the medial aspect of the left breast. This is the same breast she had the abscess drained in. No fever or chills. No nipple discharge.  Past Medical History  Diagnosis Date  . High cholesterol     Past Surgical History  Procedure Laterality Date  . Knee surgery      left    Family History  Problem Relation Age of Onset  . Heart disease Brother     Social History History  Substance Use Topics  . Smoking status: Never Smoker   . Smokeless tobacco: Not on file  . Alcohol Use: No    No Known Allergies  Current Outpatient Prescriptions  Medication Sig Dispense Refill  . Boswellia-Glucosamine-Vit D (SM GLUCOSAMINE-VITAMIN D3 PO) Take 2,000 Units by mouth daily.      . fenofibrate (TRICOR) 145 MG tablet       . doxycycline (VIBRA-TABS) 100 MG tablet Take 1 tablet (100 mg total) by mouth 2 (two) times daily.  20 tablet  2  . traMADol (ULTRAM) 50 MG tablet Take 1-2 tablets (50-100 mg total) by mouth every 6 (six) hours as needed for pain.  30 tablet  1   No current facility-administered medications for this visit.    Review of Systems Review of Systems  Constitutional: Negative for fever and chills.  HENT: Negative for congestion and postnasal drip.   Respiratory: Negative for cough.     Blood pressure 124/78, pulse 76, temperature 98.1 F (36.7 C), temperature source Temporal, resp. rate 16, height 5\' 2"  (1.575 m), weight 160 lb 12.8 oz (72.938 kg), SpO2 98.00%.  Physical Exam Physical Exam  Constitutional: She appears well-developed and well-nourished. No distress.  Pulmonary/Chest:  Right breast-no palpable masses present.  Left breast demonstrates a tender 4-5 cm mass in  the medial aspect that is somewhat irregular. No erythema over it. Well-healed left breast scar in the circumareolar area. Inferior left breast scar where she had a previous burn.  Ultrasound was performed. Some dilated ducts are noted but no obvious abscess cavity.    Data Reviewed Old notes.  Assessment    New onset left breast mass tender to palpation. Given her previous history of a breast abscess  this could be an evolving breast abscess vs an undefined neoplasm.    Plan    Start doxycycline. Refer to breast imaging Center for diagnostic mammogram and ultrasound. Return visit 5-7 days.        Cheryl Bush J 06/07/2013, 12:26 PM

## 2013-06-15 ENCOUNTER — Encounter (INDEPENDENT_AMBULATORY_CARE_PROVIDER_SITE_OTHER): Payer: Self-pay | Admitting: General Surgery

## 2013-06-15 ENCOUNTER — Ambulatory Visit (INDEPENDENT_AMBULATORY_CARE_PROVIDER_SITE_OTHER): Payer: 59 | Admitting: General Surgery

## 2013-06-15 VITALS — BP 110/62 | HR 76 | Resp 14 | Ht 62.0 in | Wt 158.4 lb

## 2013-06-15 DIAGNOSIS — N61 Mastitis without abscess: Secondary | ICD-10-CM

## 2013-06-15 MED ORDER — DOXYCYCLINE HYCLATE 100 MG PO TABS: 100.0000 mg | ORAL_TABLET | Freq: Two times a day (BID) | ORAL | Status: DC

## 2013-06-15 NOTE — Patient Instructions (Addendum)
Take one more week of antibiotics.They will at your pharmacy waiting for you.

## 2013-06-15 NOTE — Progress Notes (Signed)
Subjective:     Patient ID: Cheryl Bush, female   DOB: 04-26-1966, 47 y.o.   MRN: [id number]  HPI She is here for followup of her left breast mastitis. Ultrasound done at the breast center was consistent with this. No fluid to suggest an abscess. She states she is feeling much better. She has minimal soreness. She's been taking the antibiotics.   Review of Systems     Objective:   Physical Exam Left breast-significant decrease in swelling is present in the medial aspect. No erythema.    Assessment:     Left mastitis it is resolving with antibiotics.     Plan:     Continue doxycycline for one more week. Return visit 2 weeks.

## 2013-06-21 ENCOUNTER — Telehealth (INDEPENDENT_AMBULATORY_CARE_PROVIDER_SITE_OTHER): Payer: Self-pay

## 2013-06-21 NOTE — Telephone Encounter (Signed)
LM[facility] pt's f/u appointment is scheduled for 07/04/13 at 4:30pm with Dr. Abbey Chatters.

## 2013-06-22 ENCOUNTER — Telehealth (INDEPENDENT_AMBULATORY_CARE_PROVIDER_SITE_OTHER): Payer: Self-pay | Admitting: General Surgery

## 2013-06-22 NOTE — Telephone Encounter (Signed)
Pt called to ask about her antibiotics.  She states she will finish them tomorrow; next appt is Tues, 07/04/13.   Her breast is not swollen, but she "still feels it."  Please advise if she still needs to take additional antibiotics or not.  No increase in discomfort and no fever currently.  Leave message on her cell phone:  [phone].

## 2013-06-22 NOTE — Telephone Encounter (Signed)
No need for further antibiotics currently.

## 2013-06-22 NOTE — Telephone Encounter (Signed)
Unable to reach the patient at the number given.

## 2013-06-22 NOTE — Telephone Encounter (Signed)
Pt called and was given message from Dr. Abbey Chatters regarding her antibiotics.  She understands to keep upcoming appt and will call back for change in/ worsening of symptoms.

## 2013-07-04 ENCOUNTER — Ambulatory Visit (INDEPENDENT_AMBULATORY_CARE_PROVIDER_SITE_OTHER): Payer: 59 | Admitting: General Surgery

## 2013-07-04 ENCOUNTER — Encounter (INDEPENDENT_AMBULATORY_CARE_PROVIDER_SITE_OTHER): Payer: Self-pay | Admitting: General Surgery

## 2013-07-04 VITALS — BP 122/78 | HR 76 | Resp 14 | Ht 62.0 in | Wt 159.8 lb

## 2013-07-04 DIAGNOSIS — N61 Mastitis without abscess: Secondary | ICD-10-CM

## 2013-07-04 NOTE — Patient Instructions (Signed)
Clean both breast with Hibiclens soap in the shower every day for 7 days.

## 2013-07-04 NOTE — Progress Notes (Signed)
Subjective:     Patient ID: Cheryl Bush, female   DOB: 12/15/1965, 47 y.o.   MRN: [id number]  HPI She is here for another followup visit of her left breast mastitis. She still has a little soreness and feels a small irregularity.  No fever.   Review of Systems     Objective:   Physical Exam Left breast-No erythema, induration, or palpable mass    Assessment:     Left mastitis-resolved.    Plan:     Return prn.

## 2014-02-02 ENCOUNTER — Other Ambulatory Visit (INDEPENDENT_AMBULATORY_CARE_PROVIDER_SITE_OTHER): Payer: Self-pay | Admitting: General Surgery

## 2014-02-02 DIAGNOSIS — N6320 Unspecified lump in the left breast, unspecified quadrant: Secondary | ICD-10-CM

## 2014-03-20 ENCOUNTER — Other Ambulatory Visit: Payer: Self-pay

## 2014-03-20 ENCOUNTER — Other Ambulatory Visit (INDEPENDENT_AMBULATORY_CARE_PROVIDER_SITE_OTHER): Payer: Self-pay | Admitting: General Surgery

## 2014-03-20 DIAGNOSIS — N6320 Unspecified lump in the left breast, unspecified quadrant: Secondary | ICD-10-CM

## 2014-03-26 ENCOUNTER — Ambulatory Visit
Admission: RE | Admit: 2014-03-26 | Discharge: 2014-03-26 | Disposition: A | Discharge status: Home / Self Care | Payer: 59 | Source: Ambulatory Visit | Attending: General Surgery | Admitting: General Surgery

## 2014-03-26 ENCOUNTER — Other Ambulatory Visit (INDEPENDENT_AMBULATORY_CARE_PROVIDER_SITE_OTHER): Payer: Self-pay | Admitting: General Surgery

## 2014-03-26 ENCOUNTER — Encounter (INDEPENDENT_AMBULATORY_CARE_PROVIDER_SITE_OTHER): Payer: Self-pay

## 2014-03-26 DIAGNOSIS — N6320 Unspecified lump in the left breast, unspecified quadrant: Secondary | ICD-10-CM

## 2015-04-10 ENCOUNTER — Other Ambulatory Visit: Payer: Self-pay

## 2015-04-10 DIAGNOSIS — Z1231 Encounter for screening mammogram for malignant neoplasm of breast: Secondary | ICD-10-CM

## 2015-04-11 ENCOUNTER — Ambulatory Visit
Admission: RE | Admit: 2015-04-11 | Discharge: 2015-04-11 | Disposition: A | Discharge status: Home / Self Care | Payer: 59 | Source: Ambulatory Visit

## 2015-04-11 DIAGNOSIS — Z1231 Encounter for screening mammogram for malignant neoplasm of breast: Secondary | ICD-10-CM

## 2016-03-09 ENCOUNTER — Other Ambulatory Visit: Payer: Self-pay

## 2016-03-09 DIAGNOSIS — Z1231 Encounter for screening mammogram for malignant neoplasm of breast: Secondary | ICD-10-CM

## 2016-04-13 ENCOUNTER — Ambulatory Visit
Admission: RE | Admit: 2016-04-13 | Discharge: 2016-04-13 | Disposition: A | Discharge status: Home / Self Care | Payer: 59 | Source: Ambulatory Visit

## 2016-04-13 DIAGNOSIS — Z1231 Encounter for screening mammogram for malignant neoplasm of breast: Secondary | ICD-10-CM

## 2016-07-10 ENCOUNTER — Other Ambulatory Visit: Payer: Self-pay | Admitting: Internal Medicine

## 2016-07-10 DIAGNOSIS — M25562 Pain in left knee: Secondary | ICD-10-CM

## 2016-07-26 ENCOUNTER — Other Ambulatory Visit: Payer: 59

## 2016-07-27 ENCOUNTER — Other Ambulatory Visit: Payer: Self-pay | Admitting: Internal Medicine

## 2016-07-27 DIAGNOSIS — G8929 Other chronic pain: Secondary | ICD-10-CM

## 2016-07-27 DIAGNOSIS — M25561 Pain in right knee: Principal | ICD-10-CM

## 2016-08-02 ENCOUNTER — Ambulatory Visit
Admission: RE | Admit: 2016-08-02 | Discharge: 2016-08-02 | Disposition: A | Discharge status: Home / Self Care | Payer: 59 | Source: Ambulatory Visit | Attending: Internal Medicine | Admitting: Internal Medicine

## 2016-08-02 ENCOUNTER — Other Ambulatory Visit: Payer: Self-pay | Admitting: Internal Medicine

## 2016-08-02 ENCOUNTER — Other Ambulatory Visit: Payer: 59

## 2016-08-02 DIAGNOSIS — M25561 Pain in right knee: Principal | ICD-10-CM

## 2016-08-02 DIAGNOSIS — G8929 Other chronic pain: Secondary | ICD-10-CM

## 2016-08-04 ENCOUNTER — Telehealth: Payer: Self-pay | Admitting: Hematology and Oncology

## 2016-08-04 ENCOUNTER — Encounter: Payer: Self-pay | Admitting: Hematology and Oncology

## 2016-08-04 NOTE — Telephone Encounter (Signed)
Appt scheduled w/Gudena for 11/13 at 345pm. Demographics verified. Letter mailed to the pt.

## 2016-09-07 ENCOUNTER — Ambulatory Visit (HOSPITAL_BASED_OUTPATIENT_CLINIC_OR_DEPARTMENT_OTHER): Payer: 59 | Admitting: Hematology and Oncology

## 2016-09-07 ENCOUNTER — Encounter: Payer: Self-pay | Admitting: Hematology and Oncology

## 2016-09-07 ENCOUNTER — Other Ambulatory Visit: Payer: 59

## 2016-09-07 DIAGNOSIS — D72819 Decreased white blood cell count, unspecified: Secondary | ICD-10-CM | POA: Insufficient documentation

## 2016-09-07 DIAGNOSIS — D696 Thrombocytopenia, unspecified: Secondary | ICD-10-CM

## 2016-09-07 DIAGNOSIS — D709 Neutropenia, unspecified: Secondary | ICD-10-CM | POA: Diagnosis not present

## 2016-09-07 NOTE — Progress Notes (Signed)
[facility] NOTE  Patient Care Team: Donald Prose, MD as PCP - General (Family Medicine)  CHIEF COMPLAINTS/PURPOSE OF CONSULTATION:  Leukopenia  HISTORY OF PRESENTING ILLNESS:  Cheryl Bush 50 y.o. female is here because of low white blood cell count. Patient tells me that she has always had low WBC count going back to at least 10 years. She most recently had blood work done by her primary care physician who was very concerned about the decrease in the white blood cell count especially the neutrophil count. Upon review of her lab records indicates that the earliest lab results that I have was from 2014 when she had a low white blood cell count of 3.2 with an ANC of 1700. The most recent blood work done on 07/09/2016 at PCP office revealed a white blood cell count of 2.7 with an ANC of 1.1. At the same visit her platelet count was also minimally decreased to 116. Patient does not report any recent infections. She tells me that she has fasts periodically. She is originally [place] origin.  I reviewed her records extensively and collaborated the history with the patient.  MEDICAL HISTORY:  Past Medical History:  Diagnosis Date  . High cholesterol     SURGICAL HISTORY: Past Surgical History:  Procedure Laterality Date  . KNEE SURGERY     left    SOCIAL HISTORY: Social History   Social History  . Marital status: Divorced    Spouse name: N/A  . Number of children: N/A  . Years of education: N/A   Occupational History  . Not on file.   Social History Main Topics  . Smoking status: Never Smoker  . Smokeless tobacco: Not on file  . Alcohol use No  . Drug use: No  . Sexual activity: No   Other Topics Concern  . Not on file   Social History Narrative  . No narrative on file    FAMILY HISTORY: Family History  Problem Relation Age of Onset  . Heart disease Brother     ALLERGIES:  has No Known Allergies.  MEDICATIONS:  Current Outpatient  Prescriptions  Medication Sig Dispense Refill  . [place]-Glucosamine-Vit D (SM GLUCOSAMINE-VITAMIN D3 PO) Take 2,000 Units by mouth daily.    Marland Kitchen doxycycline (VIBRA-TABS) 100 MG tablet Take 1 tablet (100 mg total) by mouth 2 (two) times daily. 14 tablet 0  . fenofibrate (TRICOR) 145 MG tablet     . traMADol (ULTRAM) 50 MG tablet Take 1-2 tablets (50-100 mg total) by mouth every 6 (six) hours as needed for pain. 30 tablet 1   No current facility-administered medications for this visit.     REVIEW OF SYSTEMS:   Constitutional: Denies fevers, chills or abnormal night sweats Eyes: Denies blurriness of vision, double vision or watery eyes Ears, nose, mouth, throat, and face: Denies mucositis or sore throat Respiratory: Denies cough, dyspnea or wheezes Cardiovascular: Denies palpitation, chest discomfort or lower extremity swelling Gastrointestinal:  Denies nausea, heartburn or change in bowel habits Skin: Denies abnormal skin rashes Lymphatics: Denies new lymphadenopathy or easy bruising Neurological:Denies numbness, tingling or new weaknesses Behavioral/Psych: Mood is stable, no new changes  All other systems were reviewed with the patient and are negative.  PHYSICAL EXAMINATION: ECOG PERFORMANCE STATUS: 0 - Asymptomatic  Vitals:   09/07/16 1601  BP: 131/77  Pulse: 82  Resp: 18  Temp: 98.1 F (36.7 C)   Filed Weights   09/07/16 1601  Weight: 154 lb 14.4 oz (70.3 kg)  GENERAL:alert, no distress and comfortable SKIN: skin color, texture, turgor are normal, no rashes or significant lesions EYES: normal, conjunctiva are pink and non-injected, sclera clear OROPHARYNX:no exudate, no erythema and lips, buccal mucosa, and tongue normal  NECK: supple, thyroid normal size, non-tender, without nodularity LYMPH:  no palpable lymphadenopathy in the cervical, axillary or inguinal LUNGS: clear to auscultation and percussion with normal breathing effort HEART: regular rate & rhythm and no  murmurs and no lower extremity edema ABDOMEN:abdomen soft, non-tender and normal bowel sounds Musculoskeletal:no cyanosis of digits and no clubbing  PSYCH: alert & oriented x 3 with fluent speech NEURO: no focal motor/sensory deficits  LABORATORY DATA:  I have reviewed the data as listed Lab Results  Component Value Date   WBC 3.2 (L) 03/12/2013   HGB 14.5 03/12/2013   HCT 40.4 03/12/2013   MCV 83.3 03/12/2013   PLT 201 03/12/2013   Lab Results  Component Value Date   NA 141 03/12/2013   K 4.0 03/12/2013   CL 105 03/12/2013   CO2 25 03/12/2013    RADIOGRAPHIC STUDIES: I have personally reviewed the radiological reports and agreed with the findings in the report.  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 We will verify this with another CBC with differential today.  Blood work done on 07/10/2016 revealed a white blood cell count of 2.7 with an ANC of 1.1 and a platelet count of 116 with a mean platelet volume of 12 which is slightly elevated. Hemoglobin was normal at 13.8 TSH was normal at 1.38 Vitamin D levels were 31 Blood work done on 01/03/2016: WBC count of 3.4 with an [place] of 1.4 and a platelet count of 176 Blood work on 03/12/2013: WBC 3.2 with an [place] of 1.7 and a platelet count of 201  Differential diagnosis: 1. Infections: Especially viruses like HIV 2. inflammation/autoimmune causes including lupus/rheumatoid arthritis 3. Nutritional causes but M-76 or folic acid deficiency (unlikely given the normal hemoglobin levels and normal MCV) 4. Medication induced 5. Bone marrow disorders 6. Ethnicity related It is well known that patient is of African origin have lower white blood cell count than Caucasians.   Workup today: 1. CBC with differential with smear 2. K-08 and folic acid levels 3. ANA 4. Flow cytometry  Return to  clinic in 2 weeks to discuss the results and consider bone marrow biopsy if necessary I thank you very much for allowing [place] to see the patient in consultation. I will keep you informed of the progress of the workup.   All questions were answered. The patient knows to call the clinic with any problems, questions or concerns.    Rulon Eisenmenger, MD 09/07/16

## 2016-09-07 NOTE — Assessment & Plan Note (Signed)
Leukopenia and mild thrombocytopenia:  It appears that the patient has had long-standing leukopenia but the thrombocytopenia is more recent in onset. We will verify this with another CBC with differential today.  Blood work done on 07/10/2016 revealed a white blood cell count of 2.7 with an ANC of 1.1 and a platelet count of 116 with a mean platelet volume of 12 which is slightly elevated. Hemoglobin was normal at 13.8 TSH was normal at 1.38 Vitamin D levels were 31 Blood work done on 01/03/2016: WBC count of 3.4 with an [place] of 1.4 and a platelet count of 176 Blood work on 03/12/2013: WBC 3.2 with an [place] of 1.7 and a platelet count of 201  Differential diagnosis: 1. Infections: Especially viruses like HIV 2. inflammation/autoimmune causes including lupus/rheumatoid arthritis 3. Nutritional causes but E-72 or folic acid deficiency (unlikely given the normal hemoglobin levels and normal MCV) 4. Medication induced 5. Bone marrow disorders  Workup today: 1. CBC with differential with smear 2. C-94 and folic acid levels 3. ANA 4. Flow cytometry  Return to clinic in 2 weeks to discuss the results and consider bone marrow biopsy if necessary I will call the patient with the results of the blood work.

## 2016-09-08 ENCOUNTER — Other Ambulatory Visit (HOSPITAL_COMMUNITY)
Admission: RE | Admit: 2016-09-08 | Discharge: 2016-09-08 | Disposition: A | Discharge status: Home / Self Care | Payer: 59 | Source: Ambulatory Visit | Attending: Hematology and Oncology | Admitting: Hematology and Oncology

## 2016-09-08 ENCOUNTER — Other Ambulatory Visit (HOSPITAL_BASED_OUTPATIENT_CLINIC_OR_DEPARTMENT_OTHER): Payer: 59

## 2016-09-08 DIAGNOSIS — D696 Thrombocytopenia, unspecified: Secondary | ICD-10-CM

## 2016-09-08 DIAGNOSIS — D709 Neutropenia, unspecified: Secondary | ICD-10-CM | POA: Insufficient documentation

## 2016-09-08 LAB — CBC WITH DIFFERENTIAL/PLATELET
BASO%: 0.6 % (ref 0.0–2.0)
BASOS ABS: 0 10*3/uL (ref 0.0–0.1)
EOS ABS: 0.1 10*3/uL (ref 0.0–0.5)
EOS%: 2.1 % (ref 0.0–7.0)
HEMATOCRIT: 39.5 % (ref 34.8–46.6)
HGB: 14.2 g/dL (ref 11.6–15.9)
LYMPH#: 2 10*3/uL (ref 0.9–3.3)
LYMPH%: 60.5 % — ABNORMAL HIGH (ref 14.0–49.7)
MCH: 30 pg (ref 25.1–34.0)
MCHC: 35.9 g/dL (ref 31.5–36.0)
MCV: 83.5 fL (ref 79.5–101.0)
MONO#: 0.3 10*3/uL (ref 0.1–0.9)
MONO%: 9.3 % (ref 0.0–14.0)
NEUT#: 0.9 10*3/uL — ABNORMAL LOW (ref 1.5–6.5)
NEUT%: 27.5 % — ABNORMAL LOW (ref 38.4–76.8)
PLATELETS: 134 10*3/uL — AB (ref 145–400)
RBC: 4.73 10*6/uL (ref 3.70–5.45)
RDW: 12.4 % (ref 11.2–14.5)
WBC: 3.3 10*3/uL — ABNORMAL LOW (ref 3.9–10.3)

## 2016-09-09 LAB — LACTATE DEHYDROGENASE: LDH: 192 U/L (ref 125–245)

## 2016-09-09 LAB — ANTINUCLEAR ANTIBODIES, IFA: ANTINUCLEAR ANTIBODIES, IFA: NEGATIVE

## 2016-09-09 LAB — VITAMIN B12: Vitamin B12: 517 pg/mL (ref 211–946)

## 2016-09-11 LAB — FLOW CYTOMETRY

## 2016-09-22 NOTE — Assessment & Plan Note (Deleted)
It appears that the patient has had long-standing leukopenia but the thrombocytopenia is more recent in onset. We will verify this with another CBC with differential today.  Blood work done on 07/10/2016 revealed a white blood cell count of 2.7 with an ANC of 1.1 and a platelet count of 116 with a mean platelet volume of 12 which is slightly elevated. Hemoglobin was normal at 13.8 TSH was normal at 1.38 Vitamin D levels were 31 Blood work done on 01/03/2016: WBC count of 3.4 with an ANC of 1.4 and a platelet count of 176 Blood work on 03/12/2013: WBC 3.2 with an ANC of 1.7 and a platelet count of 201  Differential diagnosis: 1. Infections: Especially viruses like HIV 2. inflammation/autoimmune causes including lupus/rheumatoid arthritis 3. Nutritional causes but B-12 or folic acid deficiency (unlikely given the normal hemoglobin levels and normal MCV) 4. Medication induced 5. Bone marrow disorders 6. Ethnicity related It is well known that patient is of African origin have lower white blood cell count than Caucasians.   Workup today: 1. CBC with differential with smear: WBC 3.3, ANC 0.9, Pl 134 2. B-12 and folic acid levels: Normal 3. ANA: Normal: 4. Flow cytometry: No Abnormal morphology  No clear etiology WIll obtain a bone marrow biospy

## 2016-09-23 ENCOUNTER — Ambulatory Visit: Payer: 59 | Admitting: Hematology and Oncology

## 2017-03-31 ENCOUNTER — Other Ambulatory Visit: Payer: Self-pay | Admitting: Internal Medicine

## 2017-03-31 DIAGNOSIS — Z1231 Encounter for screening mammogram for malignant neoplasm of breast: Secondary | ICD-10-CM

## 2017-04-14 ENCOUNTER — Ambulatory Visit
Admission: RE | Admit: 2017-04-14 | Discharge: 2017-04-14 | Disposition: A | Discharge status: Home / Self Care | Payer: 59 | Source: Ambulatory Visit | Attending: Internal Medicine | Admitting: Internal Medicine

## 2017-04-14 DIAGNOSIS — Z1231 Encounter for screening mammogram for malignant neoplasm of breast: Secondary | ICD-10-CM

## 2017-04-15 ENCOUNTER — Other Ambulatory Visit: Payer: Self-pay | Admitting: Internal Medicine

## 2017-04-15 DIAGNOSIS — R928 Other abnormal and inconclusive findings on diagnostic imaging of breast: Secondary | ICD-10-CM

## 2017-04-19 ENCOUNTER — Other Ambulatory Visit: Payer: 59

## 2017-04-26 ENCOUNTER — Ambulatory Visit
Admission: RE | Admit: 2017-04-26 | Discharge: 2017-04-26 | Disposition: A | Discharge status: Home / Self Care | Payer: 59 | Source: Ambulatory Visit | Attending: Internal Medicine | Admitting: Internal Medicine

## 2017-04-26 DIAGNOSIS — R928 Other abnormal and inconclusive findings on diagnostic imaging of breast: Secondary | ICD-10-CM

## 2018-04-27 ENCOUNTER — Other Ambulatory Visit: Payer: Self-pay | Admitting: Internal Medicine

## 2018-04-27 DIAGNOSIS — Z1231 Encounter for screening mammogram for malignant neoplasm of breast: Secondary | ICD-10-CM

## 2018-05-17 ENCOUNTER — Ambulatory Visit
Admission: RE | Admit: 2018-05-17 | Discharge: 2018-05-17 | Disposition: A | Discharge status: Home / Self Care | Payer: 59 | Source: Ambulatory Visit | Attending: Internal Medicine | Admitting: Internal Medicine

## 2018-05-17 DIAGNOSIS — Z1231 Encounter for screening mammogram for malignant neoplasm of breast: Secondary | ICD-10-CM

## 2019-04-17 ENCOUNTER — Other Ambulatory Visit: Payer: Self-pay | Admitting: Internal Medicine

## 2019-04-17 DIAGNOSIS — Z1231 Encounter for screening mammogram for malignant neoplasm of breast: Secondary | ICD-10-CM

## 2019-05-29 ENCOUNTER — Other Ambulatory Visit: Payer: Self-pay

## 2019-05-29 ENCOUNTER — Ambulatory Visit
Admission: RE | Admit: 2019-05-29 | Discharge: 2019-05-29 | Disposition: A | Discharge status: Home / Self Care | Payer: 59 | Source: Ambulatory Visit | Attending: Internal Medicine | Admitting: Internal Medicine

## 2019-05-29 DIAGNOSIS — Z1231 Encounter for screening mammogram for malignant neoplasm of breast: Secondary | ICD-10-CM

## 2019-09-20 ENCOUNTER — Emergency Department (HOSPITAL_COMMUNITY)
Admission: EM | Admit: 2019-09-20 | Discharge: 2019-09-20 | Disposition: A | Discharge status: Home / Self Care | Payer: 59 | Attending: Emergency Medicine | Admitting: Emergency Medicine

## 2019-09-20 ENCOUNTER — Encounter (HOSPITAL_COMMUNITY): Payer: Self-pay | Admitting: Emergency Medicine

## 2019-09-20 ENCOUNTER — Emergency Department (HOSPITAL_COMMUNITY): Payer: 59

## 2019-09-20 ENCOUNTER — Other Ambulatory Visit: Payer: Self-pay

## 2019-09-20 DIAGNOSIS — F40298 Other specified phobia: Secondary | ICD-10-CM | POA: Diagnosis not present

## 2019-09-20 DIAGNOSIS — W228XXA Striking against or struck by other objects, initial encounter: Secondary | ICD-10-CM | POA: Diagnosis not present

## 2019-09-20 DIAGNOSIS — S060X0A Concussion without loss of consciousness, initial encounter: Secondary | ICD-10-CM | POA: Insufficient documentation

## 2019-09-20 DIAGNOSIS — Y929 Unspecified place or not applicable: Secondary | ICD-10-CM | POA: Diagnosis not present

## 2019-09-20 DIAGNOSIS — Y9389 Activity, other specified: Secondary | ICD-10-CM | POA: Diagnosis not present

## 2019-09-20 DIAGNOSIS — H53149 Visual discomfort, unspecified: Secondary | ICD-10-CM | POA: Diagnosis not present

## 2019-09-20 DIAGNOSIS — R42 Dizziness and giddiness: Secondary | ICD-10-CM | POA: Insufficient documentation

## 2019-09-20 DIAGNOSIS — S0990XA Unspecified injury of head, initial encounter: Secondary | ICD-10-CM | POA: Diagnosis present

## 2019-09-20 DIAGNOSIS — Z79899 Other long term (current) drug therapy: Secondary | ICD-10-CM | POA: Insufficient documentation

## 2019-09-20 DIAGNOSIS — Y990 Civilian activity done for income or pay: Secondary | ICD-10-CM | POA: Diagnosis not present

## 2019-09-20 MED ORDER — DIPHENHYDRAMINE HCL 50 MG/ML IJ SOLN
25.0000 mg | Freq: Once | INTRAMUSCULAR | Status: AC
  Administered 2019-09-20: 25 mg via INTRAVENOUS
  Filled 2019-09-20: qty 1

## 2019-09-20 MED ORDER — KETOROLAC TROMETHAMINE 30 MG/ML IJ SOLN
15.0000 mg | Freq: Once | INTRAMUSCULAR | Status: AC
  Administered 2019-09-20: 15 mg via INTRAVENOUS
  Filled 2019-09-20: qty 1

## 2019-09-20 MED ORDER — PROCHLORPERAZINE EDISYLATE 10 MG/2ML IJ SOLN
10.0000 mg | Freq: Once | INTRAMUSCULAR | Status: AC
  Administered 2019-09-20: 10 mg via INTRAVENOUS
  Filled 2019-09-20: qty 2

## 2019-09-20 MED ORDER — PROCHLORPERAZINE EDISYLATE 10 MG/2ML IJ SOLN: 10.0000 mg | Freq: Once | INTRAMUSCULAR | Status: DC

## 2019-09-20 NOTE — ED Notes (Signed)
Pt verbalized understanding of discharge instructions. Follow up care reviewed. Pt ambulated independently to lobby to wait for ride.

## 2019-09-20 NOTE — ED Triage Notes (Signed)
Pt here via [facility] from doctors office for worsening headache, dizziness and photophobia after pt hit top of her head on a metal beam at work on Monday. Pt went to urgent care yesterday, they sent her home and pt took a nap. Pt awakened from nap with severe HA, dizziness, and new photophobia. VSS. EMS noted small lac to top of head, not bleeding but swollen.

## 2019-09-20 NOTE — Discharge Instructions (Addendum)
Your CT Head was normal.    A concussion is a very mild traumatic brain injury caused by a bump, jolt or blow to the head, most people recover quickly and fully. You can experience a wide variety of symptoms including:   - Confusion      - Difficulty concentrating       - Trouble remembering new info  - Headache      - Dizziness        - Fuzzy or blurry vision  - Fatigue      - Balance problems      - Light sensitivity  - Mood swings     - Changes in sleep or difficulty sleeping   To help these symptoms improve make sure you are getting plenty of rest, avoid screen time, loud music and strenuous mental activities. Avoid any strenuous physical activities, once your symptoms have resolved a slow and gradual return to activity is recommended. It is very important that you avoid situations in which you might sustain a second head injury as this can be very dangerous and life threatening. You cannot be medically cleared to return to normal activities until you have followed up with your primary doctor or a concussion specialist for reevaluation.

## 2019-09-20 NOTE — ED Provider Notes (Signed)
[facility] EMERGENCY DEPARTMENT Provider Note   CSN: [id number] Arrival date & time: 09/20/19  1440     History   Chief Complaint Chief Complaint  Patient presents with  . Dizziness    HPI Cheryl Bush is a 53 y.o. female.     53 y.o female with no PMH presents to the ED via EMS with a chief complaint of dizziness. Patient was at work when she suddenly struck the top of her head with a steel beam, she reports after the accident there was pain to the area but she did not lose consciousness. She endorses a constant throbbing frontal headache which exacerbates with noise. He also reports dizziness which occurs when she is changing positions. She has taken Excedrin to help with her symptoms without improvement. She was evaluated by UC yesterday and was encourage to take the rest for the reminder of the day. She doe shave severe photophobia and phonophobia. She denies any nausea, vomiting, or weakness. No blood thinners.   The history is provided by the patient and medical records.  Dizziness Associated symptoms: headaches   Associated symptoms: no chest pain, no nausea, no shortness of breath and no vomiting     Past Medical History:  Diagnosis Date  . High cholesterol     Patient Active Problem List   Diagnosis Date Noted  . Leukopenia 09/07/2016  . Mastitis-left, medial aspect 03/15/2013    Past Surgical History:  Procedure Laterality Date  . KNEE SURGERY     left     OB History   No obstetric history on file.      Home Medications    Prior to Admission medications   Medication Sig Start Date End Date Taking? Authorizing Provider  Boswellia-Glucosamine-Vit D (SM GLUCOSAMINE-VITAMIN D3 PO) Take 2,000 Units by mouth daily.    [provider]  doxycycline (VIBRA-TABS) 100 MG tablet Take 1 tablet (100 mg total) by mouth 2 (two) times daily. 06/15/13   Jackolyn Confer, MD  fenofibrate (TRICOR) 145 MG tablet  03/28/13   [provider]  traMADol (ULTRAM) 50 MG tablet Take 1-2 tablets (50-100 mg total) by mouth every 6 (six) hours as needed for pain. 06/07/13   Jackolyn Confer, MD    Family History Family History  Problem Relation Age of Onset  . Heart disease Brother     Social History Social History   Tobacco Use  . Smoking status: Never Smoker  . Smokeless tobacco: Never Used  Substance Use Topics  . Alcohol use: No  . Drug use: No     Allergies   Patient has no known allergies.   Review of Systems Review of Systems  Constitutional: Negative for fever.  Respiratory: Negative for shortness of breath.   Cardiovascular: Negative for chest pain.  Gastrointestinal: Negative for nausea and vomiting.  Genitourinary: Negative for flank pain.  Musculoskeletal: Negative for neck pain.  Skin: Negative for pallor and wound.  Neurological: Positive for dizziness and headaches.     Physical Exam Updated Vital Signs BP (!) 159/97   Pulse 81   Temp 98.3 F (36.8 C) (Oral)   Resp 20   LMP 03/08/2013   SpO2 100%   Physical Exam Vitals signs and nursing note reviewed.  Constitutional:      General: She is not in acute distress.    Appearance: Normal appearance. She is ill-appearing.  HENT:     Head: Normocephalic.     Comments: 1 cm superficial abrasion to  the frontal head.     Nose: Nose normal.     Mouth/Throat:     Mouth: Mucous membranes are moist.  Eyes:     Pupils: Pupils are equal, round, and reactive to light.  Neck:     Musculoskeletal: Normal range of motion and neck supple. No neck rigidity or muscular tenderness.  Cardiovascular:     Rate and Rhythm: Normal rate.  Pulmonary:     Effort: Pulmonary effort is normal.  Abdominal:     General: Abdomen is flat.  Skin:    General: Skin is warm and dry.  Neurological:     Mental Status: She is alert and oriented to person, place, and time.     Comments: Alert, oriented, thought content appropriate. Speech fluent without  evidence of aphasia. Able to follow 2 step commands without difficulty.  Cranial Nerves:  II:  Peripheral visual fields grossly normal, pupils, round, reactive to light III,IV, VI: ptosis not present, extra-ocular motions intact bilaterally  V,VII: smile symmetric, facial light touch sensation equal VIII: hearing grossly normal bilaterally  IX,X: midline uvula rise  XI: bilateral shoulder shrug equal and strong XII: midline tongue extension  Motor:  5/5 in upper and lower extremities bilaterally including strong and equal grip strength and dorsiflexion/plantar flexion Sensory: light touch normal in all extremities.  Cerebellar: normal finger-to-nose with bilateral upper extremities, pronator drift negative      ED Treatments / Results  Labs (all labs ordered are listed, but only abnormal results are displayed) Labs Reviewed - No data to display  EKG EKG Interpretation  Date/Time:  Wednesday September 20 2019 14:44:36 EST Ventricular Rate:  77 PR Interval:    QRS Duration: 77 QT Interval:  364 QTC Calculation: 412 R Axis:   10 Text Interpretation: Sinus rhythm Low voltage, precordial leads Baseline wander in lead(s) V6 No STEMI Confirmed by Alvester Chou [phone]) on 09/20/2019 3:12:52 PM   Radiology Ct Head Wo Contrast  Result Date: 09/20/2019 CLINICAL DATA:  Altered level of consciousness (LOC), unexplained. Frontal headache. EXAM: CT HEAD WITHOUT CONTRAST TECHNIQUE: Contiguous axial images were obtained from the base of the skull through the vertex without intravenous contrast. COMPARISON:  None. FINDINGS: Brain: No intracranial hemorrhage, mass effect, or midline shift. No hydrocephalus. The basilar cisterns are patent. No evidence of territorial infarct or acute ischemia. No extra-axial or intracranial fluid collection. Vascular: No hyperdense vessel or unexpected calcification. Skull: No fracture or focal lesion. Sinuses/Orbits: Paranasal sinuses and mastoid air cells are  clear. The visualized orbits are unremarkable. Other: None. IMPRESSION: Unremarkable noncontrast head CT. Electronically Signed   By: Narda Rutherford M.D.   On: 09/20/2019 17:55    Procedures Procedures (including critical care time)  Medications Ordered in ED Medications  diphenhydrAMINE (BENADRYL) injection 25 mg (25 mg Intravenous Given 09/20/19 1543)  prochlorperazine (COMPAZINE) injection 10 mg (10 mg Intravenous Given 09/20/19 1545)  ketorolac (TORADOL) 30 MG/ML injection 15 mg (15 mg Intravenous Given 09/20/19 1809)     Initial Impression / Assessment and Plan / ED Course  I have reviewed the triage vital signs and the nursing notes.  Pertinent labs & imaging results that were available during my care of the patient were reviewed by me and considered in my medical decision making (see chart for details).     Patient with no pertinent past medical history presents to the ED via EMS status post injury.  Patient was at work 2 days ago when she suddenly hit the frontal side of  her head on a steel beam.  She was evaluated by urgent care yesterday with a reassuring work-up.  Today she called EMS as she begins to feel dizzy, this is described as the room kind of swaying side to side, she has not had any falls or syncope and denies any weakness.  She has been taking some Excedrin to help with her headache without improvement.  She does endorse photophobia, phonophobia, currently not on any blood thinners.  There my primary evaluation patient is well-appearing, there is no neurological deficit on my exam.  She is ambulatory with a steady gait.  Does endorse severe photophobia.  Also are within normal limits.  Will obtain CT head.  Suspicion for concussion versus intracranial pathology. She was given compazine and benadryl to help with her headache. Currently a 9/10.  CT head without any acute pathology such as hemorrhage, mass, midline shift.   6:22 PM patient was provided with Toradol  for her headache, she reports some improvement in symptoms.  Patient is requesting discharge as she has to go pick up her son.  She has been instructed that the symptoms will likely be residual for the following month.  She is to follow-up with her PCP prior to returning back to work.  She is otherwise neurologically intact.  Vital signs are within normal limits.  Precautions discussed at length.   Portions of this note were generated with Scientist, clinical (histocompatibility and immunogenetics)Dragon dictation software. Dictation errors may occur despite best attempts at proofreading.  Final Clinical Impressions(s) / ED Diagnoses   Final diagnoses:  Concussion without loss of consciousness, initial encounter    ED Discharge Orders    None       Claude MangesSoto, Neithan Day, PA-C 09/20/19 1823    Terald Sleeperrifan, Matthew J, MD 09/21/19 1037

## 2019-09-20 NOTE — ED Notes (Signed)
RN called CT, pt should be going within next 30-40 min.

## 2019-10-25 ENCOUNTER — Other Ambulatory Visit: Payer: Self-pay

## 2019-10-25 ENCOUNTER — Encounter: Payer: Self-pay | Admitting: Obstetrics & Gynecology

## 2019-10-25 ENCOUNTER — Ambulatory Visit (INDEPENDENT_AMBULATORY_CARE_PROVIDER_SITE_OTHER): Payer: 59 | Admitting: Obstetrics & Gynecology

## 2019-10-25 VITALS — BP 143/83 | HR 71 | Wt 158.2 lb

## 2019-10-25 DIAGNOSIS — R87618 Other abnormal cytological findings on specimens from cervix uteri: Secondary | ICD-10-CM

## 2019-10-25 DIAGNOSIS — Z1151 Encounter for screening for human papillomavirus (HPV): Secondary | ICD-10-CM

## 2019-10-25 DIAGNOSIS — Z124 Encounter for screening for malignant neoplasm of cervix: Secondary | ICD-10-CM

## 2019-10-25 DIAGNOSIS — Z01419 Encounter for gynecological examination (general) (routine) without abnormal findings: Secondary | ICD-10-CM

## 2019-10-25 DIAGNOSIS — B977 Papillomavirus as the cause of diseases classified elsewhere: Secondary | ICD-10-CM | POA: Diagnosis not present

## 2019-10-25 NOTE — Progress Notes (Signed)
Subjective:     Cheryl Bush is a 53 y.o. female here for a routine exam. G1P1. LMP 2014 Current complaints: normal PAP with hrHPV. Pt referred for eval.  Pt denies bleeding or leakage of urine or other GYN issues.   Gynecologic History Patient's last menstrual period was 03/08/2013. Contraception: abstinence Last Pap: 07/2018. Results were: normal with hrHPV Last mammogram: 05/2019. Results were: normal  Obstetric History OB History  Gravida Para Term Preterm AB Living  1         1  SAB TAB Ectopic Multiple Live Births          1    # Outcome Date GA Lbr Len/2nd Weight Sex Delivery Anes PTL Lv  1 Gravida 2007     Vag-Spont   LIV     The following portions of the patient's history were reviewed and updated as appropriate: allergies, current medications, past family history, past medical history, past social history, past surgical history and problem list.  Review of Systems Pertinent items are noted in HPI.    Objective:  BP (!) 143/83   Pulse 71   Wt 158 lb 3.2 oz (71.8 kg)   LMP 03/08/2013   BMI 28.94 kg/m  General Appearance:    Alert, cooperative, no distress, appears stated age  Head:    Normocephalic, without obvious abnormality, atraumatic  Eyes:    conjunctiva/corneas clear, EOM's intact, both eyes  Ears:    Normal external ear canals, both ears  Nose:   Nares normal, septum midline, mucosa normal, no drainage    or sinus tenderness  Throat:   Lips, mucosa, and tongue normal; teeth and gums normal  Neck:   Supple, symmetrical, trachea midline, no adenopathy;    thyroid:  no enlargement/tenderness/nodules  Back:     Symmetric, no curvature, ROM normal, no CVA tenderness  Lungs:     respirations unlabored  Chest Wall:    No tenderness or deformity   Heart:    Regular rate and rhythm  Breast Exam:   declined  Abdomen:     Soft, non-tender, bowel sounds active all four quadrants,    no masses, no organomegaly  Genitalia:    Normal female without lesion, discharge  or tenderness     Extremities:   Extremities normal, atraumatic, no cyanosis or edema  Pulses:   2+ and symmetric all extremities  Skin:   Skin color, texture, turgor normal, no rashes or lesions        Assessment:    Healthy female exam.   hrHPV infection    Plan:   F/u PAP and HPV F/u for annual PAP Yearly mammogram ordered by primary care .  [name] L. Harraway-Smith, M.D., Cherlynn June

## 2019-10-25 NOTE — Progress Notes (Signed)
New Pt is in the office to establish care, referral from [facility] due to abnormal pap in 2019. Records scanned in.

## 2019-10-31 LAB — CYTOLOGY - PAP
Comment: NEGATIVE
Diagnosis: NEGATIVE
High risk HPV: NEGATIVE

## 2020-04-19 ENCOUNTER — Other Ambulatory Visit: Payer: Self-pay | Admitting: Internal Medicine

## 2020-04-19 DIAGNOSIS — Z1231 Encounter for screening mammogram for malignant neoplasm of breast: Secondary | ICD-10-CM

## 2020-05-31 ENCOUNTER — Ambulatory Visit: Payer: 59

## 2020-06-06 ENCOUNTER — Ambulatory Visit
Admission: RE | Admit: 2020-06-06 | Discharge: 2020-06-06 | Disposition: A | Discharge status: Home / Self Care | Payer: 59 | Source: Ambulatory Visit | Attending: Internal Medicine | Admitting: Internal Medicine

## 2020-06-06 ENCOUNTER — Other Ambulatory Visit: Payer: Self-pay

## 2020-06-06 DIAGNOSIS — Z1231 Encounter for screening mammogram for malignant neoplasm of breast: Secondary | ICD-10-CM

## 2020-06-06 HISTORY — DX: Other signs and symptoms in breast: N64.59

## 2020-06-10 ENCOUNTER — Other Ambulatory Visit: Payer: Self-pay | Admitting: Internal Medicine

## 2020-06-10 DIAGNOSIS — R928 Other abnormal and inconclusive findings on diagnostic imaging of breast: Secondary | ICD-10-CM

## 2020-06-20 ENCOUNTER — Other Ambulatory Visit: Payer: Self-pay | Admitting: Obstetrics & Gynecology

## 2020-06-20 DIAGNOSIS — R928 Other abnormal and inconclusive findings on diagnostic imaging of breast: Secondary | ICD-10-CM

## 2020-06-21 ENCOUNTER — Other Ambulatory Visit: Payer: Self-pay | Admitting: Internal Medicine

## 2020-06-21 ENCOUNTER — Ambulatory Visit
Admission: RE | Admit: 2020-06-21 | Discharge: 2020-06-21 | Disposition: A | Discharge status: Home / Self Care | Payer: 59 | Source: Ambulatory Visit | Attending: Internal Medicine | Admitting: Internal Medicine

## 2020-06-21 ENCOUNTER — Other Ambulatory Visit: Payer: Self-pay

## 2020-06-21 DIAGNOSIS — R928 Other abnormal and inconclusive findings on diagnostic imaging of breast: Secondary | ICD-10-CM

## 2020-06-27 ENCOUNTER — Other Ambulatory Visit: Payer: Self-pay

## 2020-06-27 ENCOUNTER — Encounter: Payer: Self-pay | Admitting: Obstetrics and Gynecology

## 2020-06-27 ENCOUNTER — Ambulatory Visit (INDEPENDENT_AMBULATORY_CARE_PROVIDER_SITE_OTHER): Payer: 59 | Admitting: Obstetrics and Gynecology

## 2020-06-27 ENCOUNTER — Other Ambulatory Visit (HOSPITAL_COMMUNITY)
Admission: RE | Admit: 2020-06-27 | Discharge: 2020-06-27 | Disposition: A | Discharge status: Home / Self Care | Payer: 59 | Source: Ambulatory Visit | Attending: Obstetrics and Gynecology | Admitting: Obstetrics and Gynecology

## 2020-06-27 VITALS — BP 150/88 | HR 63 | Ht 62.0 in | Wt 158.2 lb

## 2020-06-27 DIAGNOSIS — N898 Other specified noninflammatory disorders of vagina: Secondary | ICD-10-CM

## 2020-06-27 MED ORDER — CLOTRIMAZOLE-BETAMETHASONE 1-0.05 % EX CREA: 1.0000 "application " | TOPICAL_CREAM | Freq: Two times a day (BID) | CUTANEOUS | 0 refills | Status: DC

## 2020-06-27 MED ORDER — FLUCONAZOLE 150 MG PO TABS
150.0000 mg | ORAL_TABLET | Freq: Every day | ORAL | 0 refills | Status: DC
Start: 2020-06-27 — End: 2023-04-25

## 2020-06-27 NOTE — Progress Notes (Signed)
    GYNECOLOGY ENCOUNTER NOTE  History:     Cheryl Bush is a 54 y.o. G1P0 female here with complaints of vaginal irritation. Symptoms have been present for 2 weeks. She has noticed an increase in non-odorous white discharge with itching and irritation on the outside of her vagina. She tried OTC vagisil x 1 week with no relief.   No new sex partners.    Obstetric History OB History  Gravida Para Term Preterm AB Living  1         1  SAB TAB Ectopic Multiple Live Births          1    # Outcome Date GA Lbr Len/2nd Weight Sex Delivery Anes PTL Lv  1 Gravida 2007     Vag-Spont   LIV    Past Medical History:  Diagnosis Date  . High cholesterol   . Inverted nipple left   abcess removed from areola, causing some scarring and inverted nipple    Past Surgical History:  Procedure Laterality Date  . KNEE SURGERY     left    Current Outpatient Medications on File Prior to Visit  Medication Sig Dispense Refill  . atorvastatin (LIPITOR) 10 MG tablet Take 10 mg by mouth daily.    . Boswellia-Glucosamine-Vit D (SM GLUCOSAMINE-VITAMIN D3 PO) Take 2,000 Units by mouth daily.     No current facility-administered medications on file prior to visit.    No Known Allergies  Social History:  reports that she has never smoked. She has never used smokeless tobacco. She reports that she does not drink alcohol and does not use drugs.  Family History  Problem Relation Age of Onset  . Heart disease Brother     The following portions of the patient's history were reviewed and updated as appropriate: allergies, current medications, past family history, past medical history, past social history, past surgical history and problem list.  Review of Systems Pertinent items noted in HPI and remainder of comprehensive ROS otherwise negative.  Physical Exam:  BP (!) 150/88   Pulse 63   Ht 5\' 2"  (1.575 m)   Wt 158 lb 3.2 oz (71.8 kg)   LMP 03/08/2013   BMI 28.94 kg/m  CONSTITUTIONAL:  Well-developed, well-nourished female in no acute distress.  HENT:  Normocephalic NECK: Normal range of motion, supple, no masses.   PELVIC: Normal appearing external genitalia and urethral meatus; normal appearing vaginal mucosa and cervix.  Small amount of thick white discharge.  Performed in the presence of a chaperone.   Assessment and Plan:    1. Vaginal irritation  - Cervicovaginal ancillary only( [facility]) - Rx: diflucan, clotrimazole ex cream - If no improvement would consider estrogen cream. Patient to call the office for follow up if needed.    Franchon Ketterman, 03/10/2013, NP [facility], [facility]

## 2020-06-27 NOTE — Progress Notes (Signed)
Patient presents for vaginal irritation. SX has been present for about 2 weeks. She denies having any vaginal discharge and odor. She has tried otc vagisil for 1 week with some relief, but states that she still has some irritation.

## 2020-06-28 LAB — CERVICOVAGINAL ANCILLARY ONLY
Bacterial Vaginitis (gardnerella): POSITIVE — AB
Candida Glabrata: NEGATIVE
Candida Vaginitis: POSITIVE — AB
Chlamydia: NEGATIVE
Comment: NEGATIVE
Comment: NEGATIVE
Comment: NEGATIVE
Comment: NEGATIVE
Comment: NEGATIVE
Comment: NORMAL
Neisseria Gonorrhea: NEGATIVE
Trichomonas: NEGATIVE

## 2020-12-16 IMAGING — CT CT HEAD W/O CM
4 series · 17 of 47 positions shown, 19 images · non-contrast
Comparison: None.

CLINICAL DATA: Altered level of consciousness (LOC), unexplained.
Frontal headache.

EXAM:
CT HEAD WITHOUT CONTRAST
TECHNIQUE: Contiguous axial images were obtained from the base of the skull
through the vertex without intravenous contrast.

[Series 3: head wo · axial · 0.44mm/px · z∈[+1086,+1196]mm · 7 of 30 slices shown, 9 images]
[im 4/30  brain]
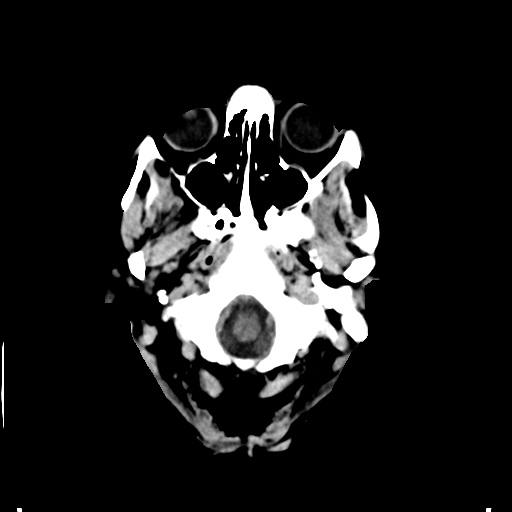
[im 4/30  bone]
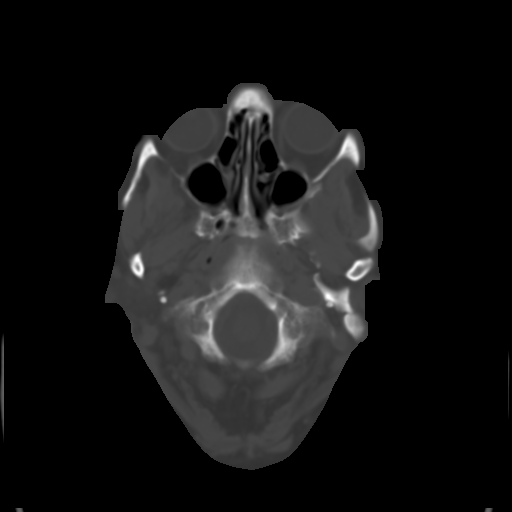
[im 8/30  brain]
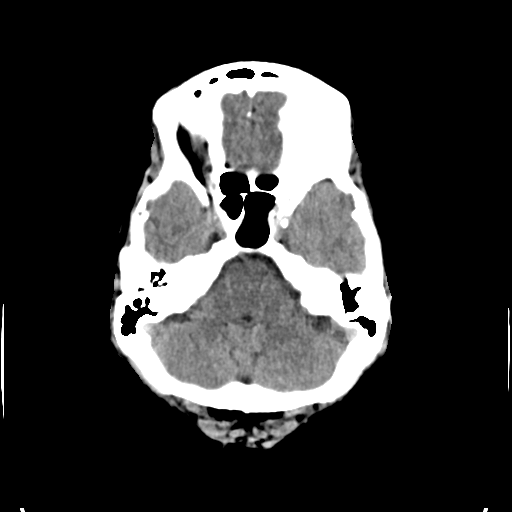
[im 11/30  brain]
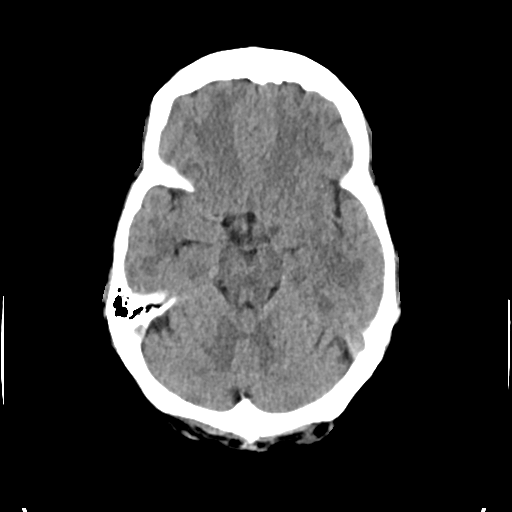
[im 15/30  brain]
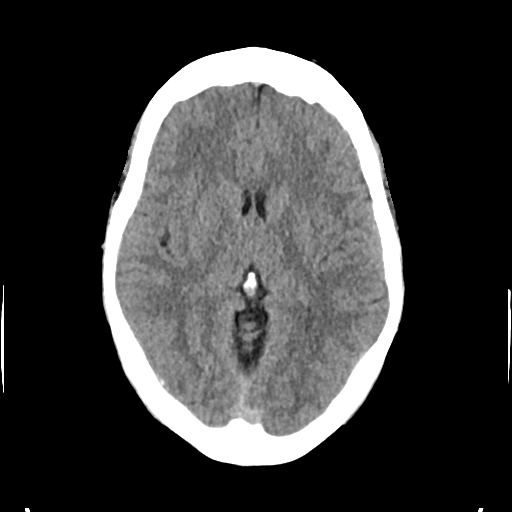
[im 19/30  brain]
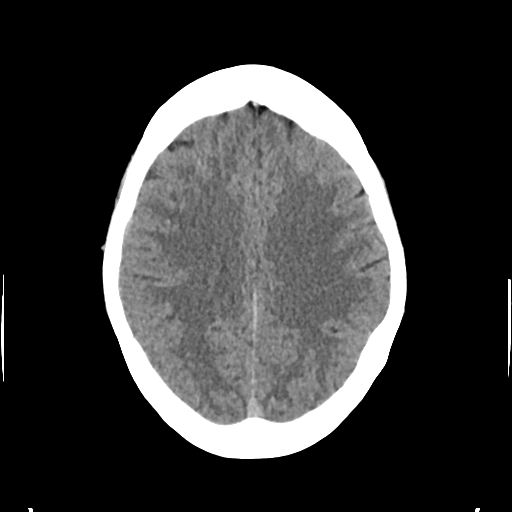
[im 19/30  bone]
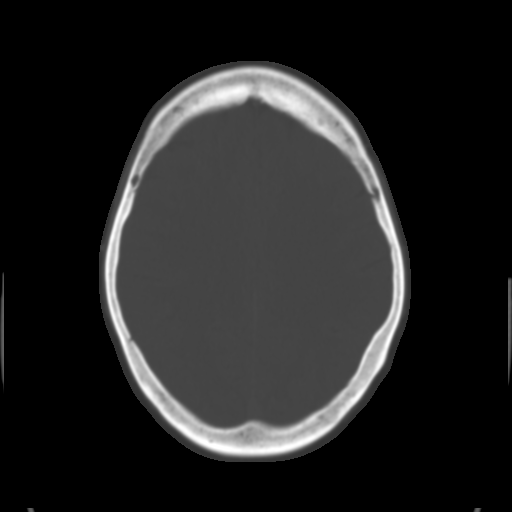
[im 22/30  brain]
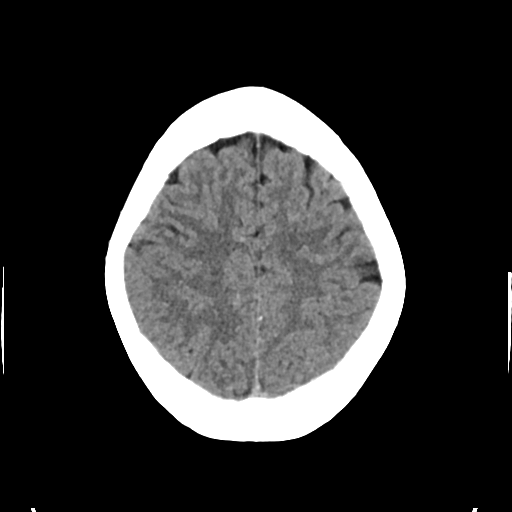
[im 26/30  brain]
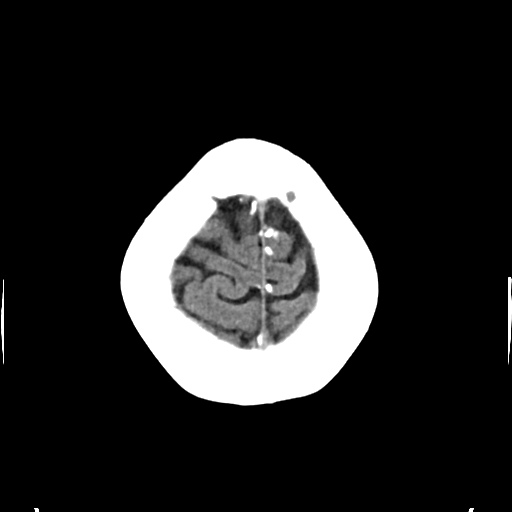

[Series 4: head bone · axial · 0.44mm/px · z∈[+1084,+1134]mm · 4 of 73 slices shown]
[im 8/73  bone]
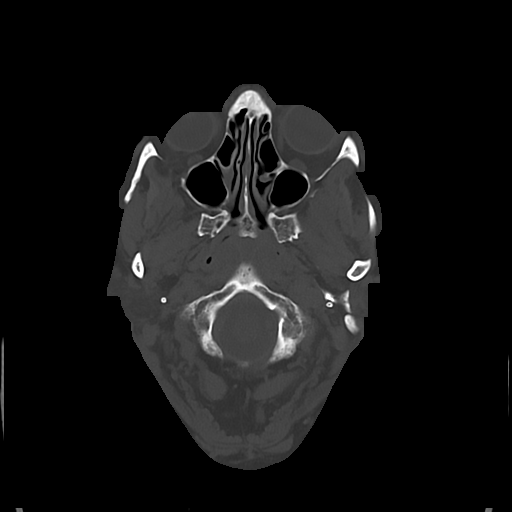
[im 15/73  bone]
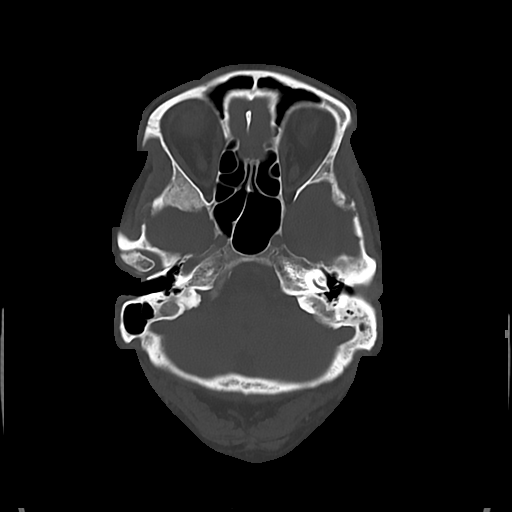
[im 22/73  bone]
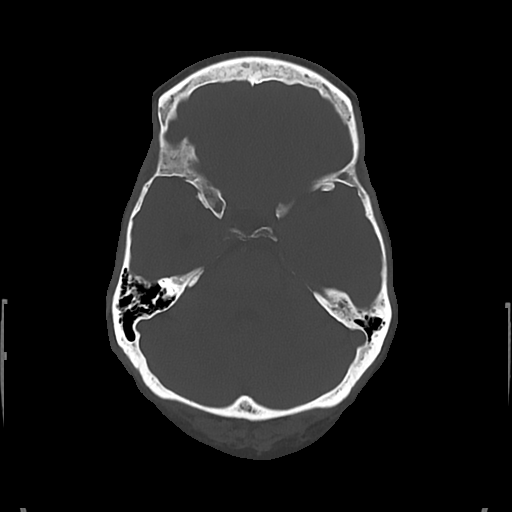
[im 33/73  bone]
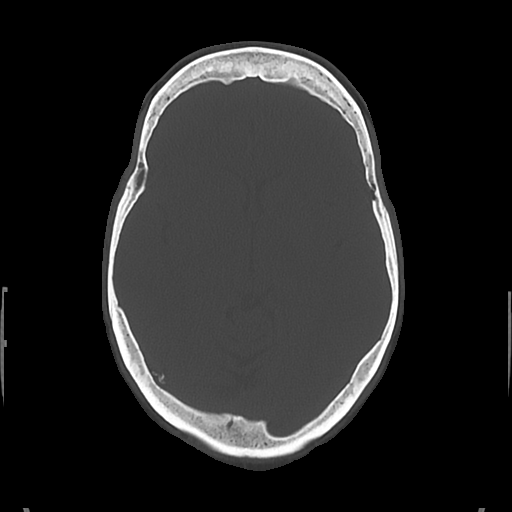

[Series 5: cor soft · coronal · 0.32mm/px · 3 of 65 slices shown]
[im 22/65  brain]
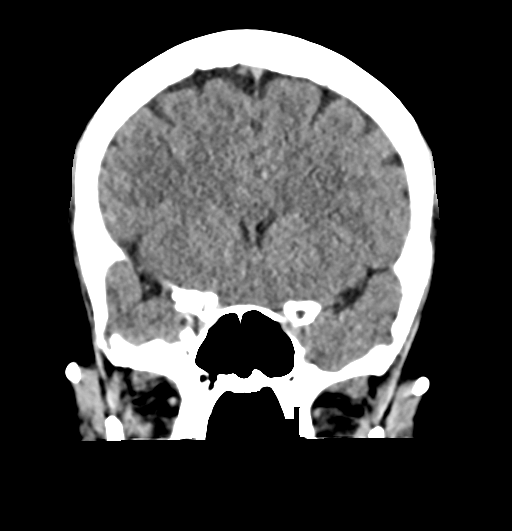
[im 29/65  brain]
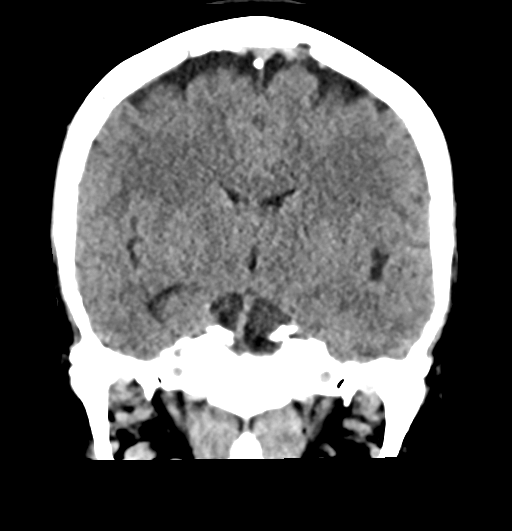
[im 36/65  brain]
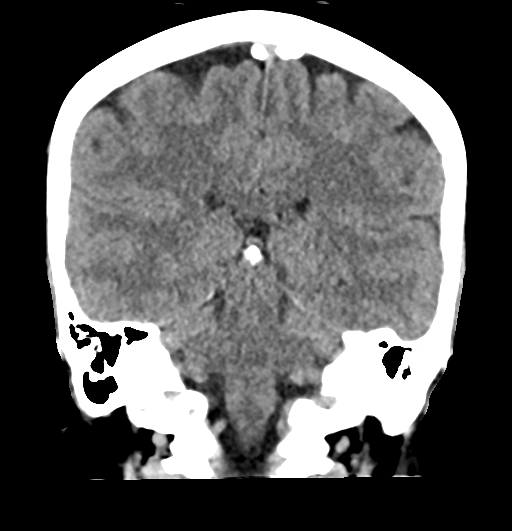

[Series 6: sag soft · sagittal · 0.33mm/px · 3 of 54 slices shown]
[im 18/54  brain]
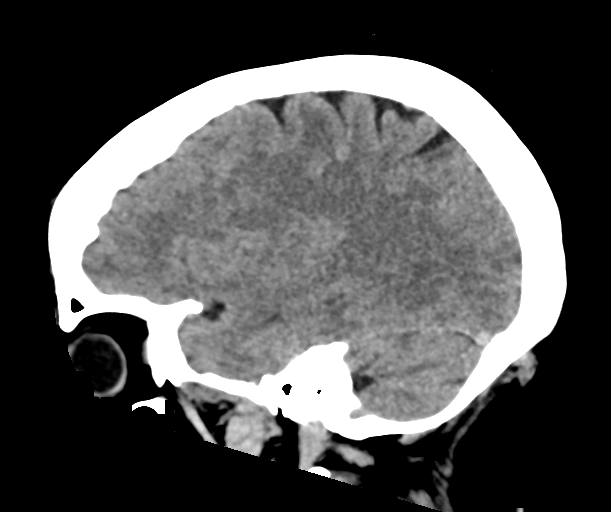
[im 27/54  brain]
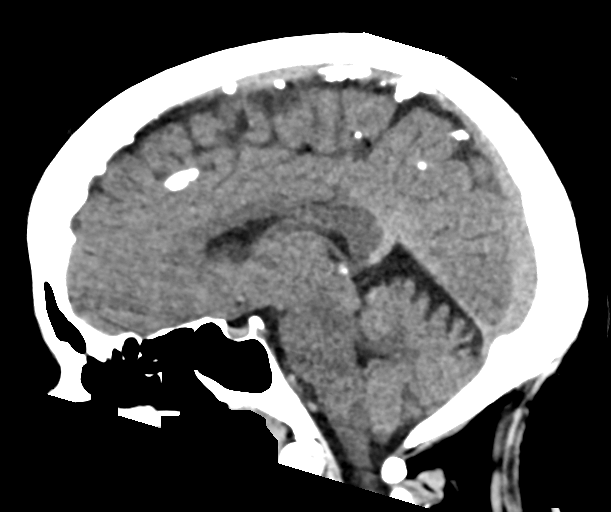
[im 36/54  brain]
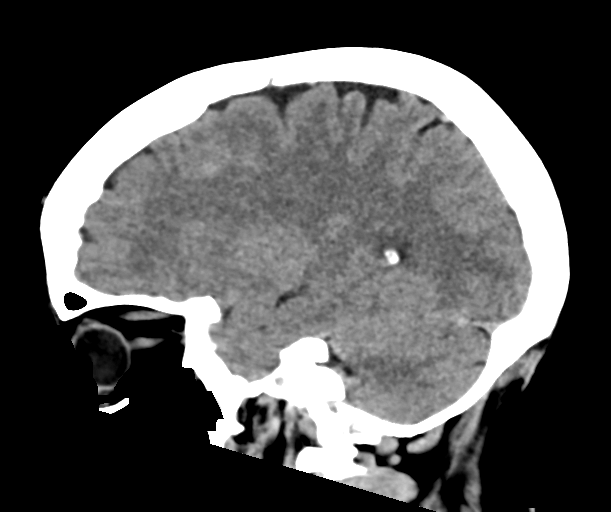

[17 of 47 positions shown; findings below may reference images not displayed]

FINDINGS: Brain: No intracranial hemorrhage, mass effect, or midline shift. No
hydrocephalus. The basilar cisterns are patent. No evidence of
territorial infarct or acute ischemia. No extra-axial or
intracranial fluid collection.

Vascular: No hyperdense vessel or unexpected calcification.

Skull: No fracture or focal lesion.

Sinuses/Orbits: Paranasal sinuses and mastoid air cells are clear.
The visualized orbits are unremarkable.

Other: None.
IMPRESSION: Unremarkable noncontrast head CT.

## 2021-06-09 ENCOUNTER — Other Ambulatory Visit: Payer: Self-pay | Admitting: Internal Medicine

## 2021-06-09 DIAGNOSIS — Z1231 Encounter for screening mammogram for malignant neoplasm of breast: Secondary | ICD-10-CM

## 2021-06-17 ENCOUNTER — Ambulatory Visit
Admission: RE | Admit: 2021-06-17 | Discharge: 2021-06-17 | Disposition: A | Discharge status: Home / Self Care | Payer: 59 | Source: Ambulatory Visit | Attending: Internal Medicine | Admitting: Internal Medicine

## 2021-06-17 ENCOUNTER — Other Ambulatory Visit: Payer: Self-pay

## 2021-06-17 DIAGNOSIS — Z1231 Encounter for screening mammogram for malignant neoplasm of breast: Secondary | ICD-10-CM

## 2022-06-04 ENCOUNTER — Other Ambulatory Visit: Payer: Self-pay | Admitting: Physician Assistant

## 2022-06-04 DIAGNOSIS — Z1231 Encounter for screening mammogram for malignant neoplasm of breast: Secondary | ICD-10-CM

## 2022-07-03 ENCOUNTER — Ambulatory Visit
Admission: RE | Admit: 2022-07-03 | Discharge: 2022-07-03 | Disposition: A | Discharge status: Home / Self Care | Payer: PRIVATE HEALTH INSURANCE | Source: Ambulatory Visit | Attending: Physician Assistant | Admitting: Physician Assistant

## 2022-07-03 DIAGNOSIS — Z1231 Encounter for screening mammogram for malignant neoplasm of breast: Secondary | ICD-10-CM

## 2022-07-08 ENCOUNTER — Telehealth: Payer: Self-pay | Admitting: Hematology and Oncology

## 2022-07-08 NOTE — Telephone Encounter (Signed)
Scheduled appt per 9/13 referral. Pt is aware of appt date and time. Pt is aware to arrive 15 mins prior to appt time and to bring and updated insurance card. Pt is aware of appt location.   

## 2022-07-28 ENCOUNTER — Encounter: Payer: Self-pay | Admitting: Orthopaedic Surgery

## 2022-07-28 ENCOUNTER — Ambulatory Visit (INDEPENDENT_AMBULATORY_CARE_PROVIDER_SITE_OTHER): Payer: PRIVATE HEALTH INSURANCE

## 2022-07-28 ENCOUNTER — Ambulatory Visit (INDEPENDENT_AMBULATORY_CARE_PROVIDER_SITE_OTHER): Payer: PRIVATE HEALTH INSURANCE | Admitting: Orthopaedic Surgery

## 2022-07-28 ENCOUNTER — Ambulatory Visit: Payer: Self-pay

## 2022-07-28 DIAGNOSIS — G8929 Other chronic pain: Secondary | ICD-10-CM | POA: Diagnosis not present

## 2022-07-28 DIAGNOSIS — M25562 Pain in left knee: Secondary | ICD-10-CM | POA: Diagnosis not present

## 2022-07-28 DIAGNOSIS — M1712 Unilateral primary osteoarthritis, left knee: Secondary | ICD-10-CM | POA: Insufficient documentation

## 2022-07-28 DIAGNOSIS — M1711 Unilateral primary osteoarthritis, right knee: Secondary | ICD-10-CM

## 2022-07-28 DIAGNOSIS — M25561 Pain in right knee: Secondary | ICD-10-CM

## 2022-07-28 NOTE — Progress Notes (Signed)
Office Visit Note   Patient: Cheryl Bush           Date of Birth: 23-Aug-1966           MRN: [id number] Visit Date: 07/28/2022              Requested by: Trey Sailors, PA [address] [place],  [place] [address] PCP: Trey Sailors, PA   Assessment & Plan: Visit Diagnoses:  1. Primary osteoarthritis of right knee   2. Primary osteoarthritis of left knee     Plan: Impression is bilateral knee advanced degenerative joint disease left greater than right.  At this point, the patient has tried multiple cortisone injections with failed relief.  She would like to proceed with total knee arthroplasty, but notes that she lives alone with her 72 year old child.  We have discussed that she will need to arrange childcare as well as ensure that her insurance will cover a skilled nursing facility which she thinks that well.  She will also need to make sure that she has help driving to and from physical therapy once she is discharged back home.  She understands and agrees.  She will follow-up with [place] once all this is arranged.  Call with concerns or questions.  Follow-Up Instructions: Return if symptoms worsen or fail to improve.   Orders:  Orders Placed This Encounter  Procedures   XR KNEE 3 VIEW LEFT   XR KNEE 3 VIEW RIGHT   No orders of the defined types were placed in this encounter.     Procedures: No procedures performed   Clinical Data: No additional findings.   Subjective: Chief Complaint  Patient presents with   Left Knee - Pain   Right Knee - Pain    HPI patient is a pleasant 56 year old female who comes in today with bilateral knee pain left greater than right.  Symptoms of been ongoing for the past 4 years and have progressively worsened.  The pain she has is constant but worse with walking as well as going from a seated to standing position.  She does not take medication for this.  She has undergone cortisone injections as well as viscosupplementation  injections without relief.   Review of Systems as detailed in HPI.  All others reviewed and are negative.   Objective: Vital Signs: LMP 03/08/2013   Physical Exam well-developed and well-nourished female in no acute distress.  Alert and oriented x3.  Ortho Exam bilateral knee exam shows trace effusion.  Range of motion 0 to 100 degrees.  Valgus deformity both sides.  Moderate tenderness medial joint line.  Ligaments are stable.  She is neurovascular tact distally.  Specialty Comments:  No specialty comments available.  Imaging: XR KNEE 3 VIEW LEFT  Result Date: 07/28/2022 X-rays demonstrate advanced medial and patellofemoral degenerative changes with varus deformity  XR KNEE 3 VIEW RIGHT  Result Date: 07/28/2022 X-rays demonstrate advanced medial and patellofemoral degenerative changes with varus deformity    PMFS History: Patient Active Problem List   Diagnosis Date Noted   Primary osteoarthritis of right knee 07/28/2022   Primary osteoarthritis of left knee 07/28/2022   Leukopenia 09/07/2016   Mastitis-left, medial aspect 03/15/2013   Past Medical History:  Diagnosis Date   High cholesterol    Inverted nipple left   abcess removed from areola, causing some scarring and inverted nipple    Family History  Problem Relation Age of Onset   Heart disease Brother  Past Surgical History:  Procedure Laterality Date   KNEE SURGERY     left   Social History   Occupational History   Not on file  Tobacco Use   Smoking status: Never   Smokeless tobacco: Never  Vaping Use   Vaping Use: Never used  Substance and Sexual Activity   Alcohol use: No   Drug use: No   Sexual activity: Never

## 2022-07-29 ENCOUNTER — Inpatient Hospital Stay: Payer: PRIVATE HEALTH INSURANCE

## 2022-07-29 ENCOUNTER — Inpatient Hospital Stay: Payer: PRIVATE HEALTH INSURANCE | Attending: Hematology and Oncology | Admitting: Hematology and Oncology

## 2022-07-29 ENCOUNTER — Other Ambulatory Visit: Payer: Self-pay

## 2022-07-29 DIAGNOSIS — D709 Neutropenia, unspecified: Secondary | ICD-10-CM

## 2022-07-29 DIAGNOSIS — D72819 Decreased white blood cell count, unspecified: Secondary | ICD-10-CM | POA: Insufficient documentation

## 2022-07-29 LAB — CMP (CANCER CENTER ONLY)
ALT: 17 U/L (ref 0–44)
AST: 21 U/L (ref 15–41)
Albumin: 4.5 g/dL (ref 3.5–5.0)
Alkaline Phosphatase: 69 U/L (ref 38–126)
Anion gap: 5 (ref 5–15)
BUN: 13 mg/dL (ref 6–20)
CO2: 26 mmol/L (ref 22–32)
Calcium: 9.3 mg/dL (ref 8.9–10.3)
Chloride: 106 mmol/L (ref 98–111)
Creatinine: 0.71 mg/dL (ref 0.44–1.00)
GFR, Estimated: >60 mL/min (ref >60)
Glucose, Bld: 87 mg/dL (ref 70–99)
Potassium: 4.2 mmol/L (ref 3.5–5.1)
Sodium: 137 mmol/L (ref 135–145)
Total Bilirubin: 0.7 mg/dL (ref 0.3–1.2)
Total Protein: 7.8 g/dL (ref 6.5–8.1)

## 2022-07-29 LAB — CBC WITH DIFFERENTIAL (CANCER CENTER ONLY)
Abs Immature Granulocytes: 0 10*3/uL (ref 0.00–0.07)
Basophils Absolute: 0.1 10*3/uL (ref 0.0–0.1)
Basophils Relative: 2 %
Eosinophils Absolute: 0.1 10*3/uL (ref 0.0–0.5)
Eosinophils Relative: 3 %
HCT: 39.5 % (ref 36.0–46.0)
Hemoglobin: 14.2 g/dL (ref 12.0–15.0)
Immature Granulocytes: 0 %
Lymphocytes Relative: 56 %
Lymphs Abs: 1.8 10*3/uL (ref 0.7–4.0)
MCH: 29.8 pg (ref 26.0–34.0)
MCHC: 35.9 g/dL (ref 30.0–36.0)
MCV: 82.8 fL (ref 80.0–100.0)
Monocytes Absolute: 0.4 10*3/uL (ref 0.1–1.0)
Monocytes Relative: 11 %
Neutro Abs: 0.9 10*3/uL — ABNORMAL LOW (ref 1.7–7.7)
Neutrophils Relative %: 28 %
Platelet Count: 150 10*3/uL (ref 150–400)
RBC: 4.77 MIL/uL (ref 3.87–5.11)
RDW: 12.4 % (ref 11.5–15.5)
WBC Count: 3.2 10*3/uL — ABNORMAL LOW (ref 4.0–10.5)
nRBC: 0 % (ref 0.0–0.2)

## 2022-07-29 LAB — VITAMIN B12: Vitamin B-12: 332 pg/mL (ref 180–914)

## 2022-07-29 LAB — FOLATE: Folate: 14.1 ng/mL (ref >5.9)

## 2022-07-29 NOTE — Progress Notes (Signed)
[facility] NOTE  Patient Care Team: Nicholas Lose, MD as PCP - General (Hematology and Oncology)  CHIEF COMPLAINTS/PURPOSE OF CONSULTATION:  Leukopenia  HISTORY OF PRESENTING ILLNESS:  Cheryl Bush 56 y.o. female is here because of recent diagnosis of leukopenia.  Patient has had longstanding leukopenia she tells me that the 1 in 2017 she was found to have low white blood cell count better she has not had any health issues or concerns.  She has arthritis in the knee but otherwise she is without any concerns.  Recently she had 2 sets of blood work both of which showed decrease in white blood cell count especially the neutrophils and she was referred to [place] for further work-up.  I reviewed her records extensively and collaborated the history with the patient.     MEDICAL HISTORY:  Past Medical History:  Diagnosis Date   High cholesterol    Inverted nipple left   abcess removed from areola, causing some scarring and inverted nipple    SURGICAL HISTORY: Past Surgical History:  Procedure Laterality Date   KNEE SURGERY     left    SOCIAL HISTORY: Social History   Socioeconomic History   Marital status: Divorced    Spouse name: Not on file   Number of children: Not on file   Years of education: Not on file   Highest education level: Not on file  Occupational History   Not on file  Tobacco Use   Smoking status: Never   Smokeless tobacco: Never  Vaping Use   Vaping Use: Never used  Substance and Sexual Activity   Alcohol use: No   Drug use: No   Sexual activity: Never  Other Topics Concern   Not on file  Social History Narrative   Not on file   Social Determinants of Health   Financial Resource Strain: Not on file  Food Insecurity: Not on file  Transportation Needs: Not on file  Physical Activity: Not on file  Stress: Not on file  Social Connections: Not on file  Intimate Partner Violence: Not on file    FAMILY HISTORY: Family History   Problem Relation Age of Onset   Heart disease Brother     ALLERGIES:  has No Known Allergies.  MEDICATIONS:  Current Outpatient Medications  Medication Sig Dispense Refill   atorvastatin (LIPITOR) 10 MG tablet Take 10 mg by mouth daily.     Boswellia-Glucosamine-Vit D (SM GLUCOSAMINE-VITAMIN D3 PO) Take 2,000 Units by mouth daily.     clotrimazole-betamethasone (LOTRISONE) cream Apply 1 application topically 2 (two) times daily. 30 g 0   fluconazole (DIFLUCAN) 150 MG tablet Take 1 tablet (150 mg total) by mouth daily. Take one dose today and repeat in 3 days. 2 tablet 0   No current facility-administered medications for this visit.    REVIEW OF SYSTEMS:   Constitutional: Denies fevers, chills or abnormal night sweats   All other systems were reviewed with the patient and are negative.  PHYSICAL EXAMINATION: ECOG PERFORMANCE STATUS: 0 - Asymptomatic  Vitals:   07/29/22 1544  BP: 134/80  Pulse: 67  Resp: 18  Temp: (!) 97.3 F (36.3 C)  SpO2: 99%   Filed Weights   07/29/22 1544  Weight: 149 lb 12.8 oz (67.9 kg)    GENERAL:alert, no distress and comfortable    LABORATORY DATA:  I have reviewed the data as listed Lab Results  Component Value Date   WBC 3.3 (L) 09/08/2016   HGB 14.2  09/08/2016   HCT 39.5 09/08/2016   MCV 83.5 09/08/2016   PLT 134 (L) 09/08/2016   Lab Results  Component Value Date   NA 141 03/12/2013   K 4.0 03/12/2013   CL 105 03/12/2013   CO2 25 03/12/2013    RADIOGRAPHIC STUDIES: I have personally reviewed the radiological reports and agreed with the findings in the report.  ASSESSMENT AND PLAN:  Leukopenia 11/28/2021: WBC 2.9, hemoglobin 14.4, platelets 144, ANC 0.8, ALC 1.7 07/04/2022: WBC 2.6, hemoglobin 14.9, platelets 136, ANC 0.77, ALC 1.46  Differential diagnosis: 1. Medications 2. viral illnesses 3. Autoimmune conditions like lupus 4. Y-77 or folic acid deficiencies 5. Bone marrow disorders 6. Cyclical neutropenia 7.  Ethnicity related    Recommendation: 1. Recheck CBC with differential 2. A-12 and folic acid levels 3. ANA  If the blood tests are normal then we may have to consider doing a bone marrow biopsy. I will call the patient with the results of these tests to discuss the further plan.    All questions were answered. The patient knows to call the clinic with any problems, questions or concerns.    Harriette Ohara, MD 07/29/22

## 2022-07-29 NOTE — Assessment & Plan Note (Signed)
11/28/2021: WBC 2.9, hemoglobin 14.4, platelets 144, ANC 0.8, ALC 1.7 07/04/2022: WBC 2.6, hemoglobin 14.9, platelets 136, ANC 0.77, ALC 1.46  Differential diagnosis: 1. Medications 2. viral illnesses 3. Autoimmune conditions like lupus 4. P-71 or folic acid deficiencies 5. Bone marrow disorders 6. Cyclical neutropenia 7. Ethnicity related    Recommendation: 1. Recheck CBC with differential 2. G-62 and folic acid levels 3. ANA  If she has persistent leukopenia and all above tests are normal then we may have to perform a bone marrow biopsy. If she only has transient leukopenia then we could watch and monitor this. I will call the patient with the results of these tests to discuss the further plan.

## 2022-07-30 ENCOUNTER — Inpatient Hospital Stay (HOSPITAL_BASED_OUTPATIENT_CLINIC_OR_DEPARTMENT_OTHER): Payer: PRIVATE HEALTH INSURANCE | Admitting: Hematology and Oncology

## 2022-07-30 DIAGNOSIS — D709 Neutropenia, unspecified: Secondary | ICD-10-CM | POA: Diagnosis not present

## 2022-07-30 NOTE — Assessment & Plan Note (Signed)
11/28/2021: WBC 2.9, hemoglobin 14.4, platelets 144, ANC 0.8, ALC 1.7 07/04/2022: WBC 2.6, hemoglobin 14.9, platelets 136, ANC 0.77, ALC 1.46 07/29/2022: WBC 3.2, hemoglobin 14.2, ANC 0.9, CMP normal, B12 332, ANA pending  Lab review I discussed the results of the labs with the patient.  It does appear that the white blood cell count is reasonably stable and in fact it is better than what it was before.  We discussed the pros and cons of doing a bone marrow biopsy versus watchful monitoring.

## 2022-07-30 NOTE — Progress Notes (Signed)
HEMATOLOGY-ONCOLOGY TELEPHONE VISIT PROGRESS NOTE  I connected with our patient on 07/30/22 at 12:00 PM EDT by telephone and verified that I am speaking with the correct person using two identifiers.  I discussed the limitations, risks, security and privacy concerns of performing an evaluation and management service by telephone and the availability of in person appointments.  I also discussed with the patient that there may be a patient responsible charge related to this service. The patient expressed understanding and agreed to proceed.   History of Present Illness: Patient is connected with me by telephone to discuss results of blood work that she had yesterday.   REVIEW OF SYSTEMS:   Constitutional: Denies fevers, chills or abnormal weight loss All other systems were reviewed with the patient and are negative.  Observations/Objective:     Assessment Plan:  Leukopenia 11/28/2021: WBC 2.9, hemoglobin 14.4, platelets 144, ANC 0.8, ALC 1.7 07/04/2022: WBC 2.6, hemoglobin 14.9, platelets 136, ANC 0.77, ALC 1.46 07/29/2022: WBC 3.2, hemoglobin 14.2, ANC 0.9, CMP normal, B12 332, ANA pending  Lab review I discussed the results of the labs with the patient.  It does appear that the white blood cell count is reasonably stable and in fact it is better than what it was before.  We discussed the pros and cons of doing a bone marrow biopsy versus watchful monitoring. Because the patient is very anxious about making sure she does not have any cancer in the bone marrow we will proceed with requesting radiology to do a bone marrow biopsy.  Telephone visit after the bone marrow biopsy to discuss results.  I discussed the assessment and treatment plan with the patient. The patient was provided an opportunity to ask questions and all were answered. The patient agreed with the plan and demonstrated an understanding of the instructions. The patient was advised to call back or seek an in-person evaluation if  the symptoms worsen or if the condition fails to improve as anticipated.   I provided 12 minutes of non-face-to-face time during this encounter.  This includes time for charting and coordination of care   Harriette Ohara, MD

## 2022-07-31 LAB — ANTINUCLEAR ANTIBODIES, IFA: ANA Ab, IFA: NEGATIVE

## 2022-08-21 ENCOUNTER — Ambulatory Visit: Payer: PRIVATE HEALTH INSURANCE | Admitting: Hematology and Oncology

## 2022-09-21 ENCOUNTER — Ambulatory Visit: Payer: PRIVATE HEALTH INSURANCE | Admitting: Hematology and Oncology

## 2022-10-08 ENCOUNTER — Other Ambulatory Visit: Payer: Self-pay | Admitting: Radiology

## 2022-10-08 DIAGNOSIS — D709 Neutropenia, unspecified: Secondary | ICD-10-CM

## 2022-10-08 NOTE — Consult Note (Signed)
Chief Complaint: Patient was seen in consultation today for CT guided bone marrow biopsy  Referring Physician(s): [facility]  Supervising Physician: Corrie Mckusick  Patient Status: [facility] - Out-pt  History of Present Illness: Cheryl Bush is a 56 y.o. female with past medical history of hyperlipidemia, remote left breast abscess ,osteoarthritis and now with persistent leukopenia of uncertain etiology.  She presents today for CT-guided bone marrow biopsy for further evaluation.  Past Medical History:  Diagnosis Date   High cholesterol    Inverted nipple left   abcess removed from areola, causing some scarring and inverted nipple    Past Surgical History:  Procedure Laterality Date   KNEE SURGERY     left    Allergies: Patient has no known allergies.  Medications: Prior to Admission medications   Medication Sig Start Date End Date Taking? Authorizing Provider  atorvastatin (LIPITOR) 10 MG tablet Take 10 mg by mouth daily.    [provider]  Boswellia-Glucosamine-Vit D (SM GLUCOSAMINE-VITAMIN D3 PO) Take 2,000 Units by mouth daily.    [provider]  clotrimazole-betamethasone (LOTRISONE) cream Apply 1 application topically 2 (two) times daily. 06/27/20   Rasch, Anderson Malta I, NP  fluconazole (DIFLUCAN) 150 MG tablet Take 1 tablet (150 mg total) by mouth daily. Take one dose today and repeat in 3 days. 06/27/20   Rasch, Artist Pais, NP     Family History  Problem Relation Age of Onset   Heart disease Brother     Social History   Socioeconomic History   Marital status: Divorced    Spouse name: Not on file   Number of children: Not on file   Years of education: Not on file   Highest education level: Not on file  Occupational History   Not on file  Tobacco Use   Smoking status: Never   Smokeless tobacco: Never  Vaping Use   Vaping Use: Never used  Substance and Sexual Activity   Alcohol use: No   Drug use: No   Sexual activity: Never  Other  Topics Concern   Not on file  Social History Narrative   Not on file   Social Determinants of Health   Financial Resource Strain: Not on file  Food Insecurity: Not on file  Transportation Needs: Not on file  Physical Activity: Not on file  Stress: Not on file  Social Connections: Not on file      Review of Systems denies fever,HA,CP,dyspnea, cough, abd/back pain,N/V or bleeding  Vital Signs: Vitals:   10/09/22 0720  BP: 133/86  Pulse: 72  Resp: 16  Temp: 98.2 F (36.8 C)  SpO2: 99%    LMP 03/08/2013     Physical Exam awake/alert; chest- CTA bilat; heart- RRR; abd- soft,+BS,NT; no LE edema  Imaging: No results found.  Labs:  CBC: Recent Labs    07/29/22 1613  WBC 3.2*  HGB 14.2  HCT 39.5  PLT 150    COAGS: No results for input(s): "INR", "APTT" in the last 8760 hours.  BMP: Recent Labs    07/29/22 1613  NA 137  K 4.2  CL 106  CO2 26  GLUCOSE 87  BUN 13  CALCIUM 9.3  CREATININE 0.71  GFRNONAA >60    LIVER FUNCTION TESTS: Recent Labs    07/29/22 1613  BILITOT 0.7  AST 21  ALT 17  ALKPHOS 69  PROT 7.8  ALBUMIN 4.5    TUMOR MARKERS: No results for input(s): "AFPTM", "CEA", "CA199", "CHROMGRNA" in the last 8760  hours.  Assessment and Plan: 56 y.o. female with past medical history of hyperlipidemia, osteoarthritis and now with persistent leukopenia of uncertain etiology.  She presents today for CT-guided bone marrow biopsy for further evaluation.Risks and benefits of procedure was discussed with the patient including, but not limited to bleeding, infection, damage to adjacent structures or low yield requiring additional tests.  All of the questions were answered and there is agreement to proceed.  Consent signed and in chart.    Thank you for this interesting consult.  I greatly enjoyed meeting Charlea Kea and look forward to participating in their care.  A copy of this report was sent to the requesting provider on this  date.  Electronically Signed: D. Rowe Robert, PA-C 10/08/2022, 2:30 PM   I spent a total of  20 minutes   in face to face in clinical consultation, greater than 50% of which was counseling/coordinating care for CT guided bone marrow biopsy

## 2022-10-09 ENCOUNTER — Encounter (HOSPITAL_COMMUNITY): Payer: Self-pay

## 2022-10-09 ENCOUNTER — Ambulatory Visit (HOSPITAL_COMMUNITY)
Admission: RE | Admit: 2022-10-09 | Discharge: 2022-10-09 | Disposition: A | Discharge status: Home / Self Care | Payer: PRIVATE HEALTH INSURANCE | Source: Ambulatory Visit | Attending: Hematology and Oncology | Admitting: Hematology and Oncology

## 2022-10-09 DIAGNOSIS — D709 Neutropenia, unspecified: Secondary | ICD-10-CM | POA: Diagnosis present

## 2022-10-09 DIAGNOSIS — D72819 Decreased white blood cell count, unspecified: Secondary | ICD-10-CM | POA: Diagnosis not present

## 2022-10-09 DIAGNOSIS — D696 Thrombocytopenia, unspecified: Secondary | ICD-10-CM | POA: Insufficient documentation

## 2022-10-09 LAB — CBC WITH DIFFERENTIAL/PLATELET
Abs Immature Granulocytes: 0 10*3/uL (ref 0.00–0.07)
Basophils Absolute: 0 10*3/uL (ref 0.0–0.1)
Basophils Relative: 2 %
Eosinophils Absolute: 0.1 10*3/uL (ref 0.0–0.5)
Eosinophils Relative: 2 %
HCT: 45.4 % (ref 36.0–46.0)
Hemoglobin: 15.4 g/dL — ABNORMAL HIGH (ref 12.0–15.0)
Immature Granulocytes: 0 %
Lymphocytes Relative: 52 %
Lymphs Abs: 1.4 10*3/uL (ref 0.7–4.0)
MCH: 29.5 pg (ref 26.0–34.0)
MCHC: 33.9 g/dL (ref 30.0–36.0)
MCV: 87 fL (ref 80.0–100.0)
Monocytes Absolute: 0.2 10*3/uL (ref 0.1–1.0)
Monocytes Relative: 9 %
Neutro Abs: 0.9 10*3/uL — ABNORMAL LOW (ref 1.7–7.7)
Neutrophils Relative %: 35 %
Platelets: 142 10*3/uL — ABNORMAL LOW (ref 150–400)
RBC: 5.22 MIL/uL — ABNORMAL HIGH (ref 3.87–5.11)
RDW: 12.2 % (ref 11.5–15.5)
WBC: 2.7 10*3/uL — ABNORMAL LOW (ref 4.0–10.5)
nRBC: 0 % (ref 0.0–0.2)

## 2022-10-09 MED ORDER — MIDAZOLAM HCL 2 MG/2ML IJ SOLN
INTRAMUSCULAR | Status: AC | PRN
  Administered 2022-10-09 (×2): 1 mg via INTRAVENOUS

## 2022-10-09 MED ORDER — LIDOCAINE HCL (PF) 1 % IJ SOLN
INTRAMUSCULAR | Status: AC | PRN
  Administered 2022-10-09: 5 mL

## 2022-10-09 MED ORDER — SODIUM CHLORIDE 0.9 % IV SOLN: INTRAVENOUS | Status: DC

## 2022-10-09 MED ORDER — MIDAZOLAM HCL 2 MG/2ML IJ SOLN
INTRAMUSCULAR | Status: AC
  Filled 2022-10-09: qty 4

## 2022-10-09 MED ORDER — NALOXONE HCL 0.4 MG/ML IJ SOLN
INTRAMUSCULAR | Status: AC
  Filled 2022-10-09: qty 1

## 2022-10-09 MED ORDER — FLUMAZENIL 0.5 MG/5ML IV SOLN
INTRAVENOUS | Status: AC
  Filled 2022-10-09: qty 5

## 2022-10-09 MED ORDER — FENTANYL CITRATE (PF) 100 MCG/2ML IJ SOLN
INTRAMUSCULAR | Status: AC | PRN
  Administered 2022-10-09 (×2): 50 ug via INTRAVENOUS

## 2022-10-09 MED ORDER — FENTANYL CITRATE (PF) 100 MCG/2ML IJ SOLN
INTRAMUSCULAR | Status: AC
  Filled 2022-10-09: qty 4

## 2022-10-09 NOTE — Sedation Documentation (Signed)
Sample obtained 

## 2022-10-09 NOTE — Procedures (Signed)
Interventional Radiology Procedure Note ? ?Procedure: CT guided aspirate and core biopsy of right iliac bone ?Complications: None ?Recommendations: ?- Bedrest supine x 1 hrs ?- OTC's PRN  Pain ?- Follow biopsy results ? ?Signed, ? ?Tylisa Alcivar S. Linken Mcglothen, DO ? ? ?

## 2022-10-13 LAB — SURGICAL PATHOLOGY

## 2022-10-14 NOTE — Progress Notes (Signed)
   Patient Care Team: Serena Croissant, MD as PCP - General (Hematology and Oncology)  DIAGNOSIS: No diagnosis found.  SUMMARY OF ONCOLOGIC HISTORY: Oncology History   No history exists.    CHIEF COMPLIANT:   INTERVAL HISTORY: Davene Borah is a   ALLERGIES:  has No Known Allergies.  MEDICATIONS:  Current Outpatient Medications  Medication Sig Dispense Refill   atorvastatin (LIPITOR) 10 MG tablet Take 10 mg by mouth daily.     Boswellia-Glucosamine-Vit D (SM GLUCOSAMINE-VITAMIN D3 PO) Take 2,000 Units by mouth daily.     clotrimazole-betamethasone (LOTRISONE) cream Apply 1 application topically 2 (two) times daily. 30 g 0   fluconazole (DIFLUCAN) 150 MG tablet Take 1 tablet (150 mg total) by mouth daily. Take one dose today and repeat in 3 days. 2 tablet 0   No current facility-administered medications for this visit.    PHYSICAL EXAMINATION: ECOG PERFORMANCE STATUS: {CHL ONC ECOG PS:[id number]}  There were no vitals filed for this visit. There were no vitals filed for this visit.  BREAST:*** No palpable masses or nodules in either right or left breasts. No palpable axillary supraclavicular or infraclavicular adenopathy no breast tenderness or nipple discharge. (exam performed in the presence of a chaperone)  LABORATORY DATA:  I have reviewed the data as listed    Latest Ref Rng & Units 07/29/2022    4:13 PM 03/12/2013    4:40 PM 07/10/2009    8:17 PM  CMP  Glucose 70 - 99 mg/dL 87  147  81   BUN 6 - 20 mg/dL 13  13  14    Creatinine 0.44 - 1.00 mg/dL  8.29  5.62   Sodium 135 - 145 mmol/L 137  141  139   Potassium 3.5 - 5.1 mmol/L 4.2  4.0  4.1   Chloride 98 - 111 mmol/L 106  105  103   CO2 22 - 32 mmol/L 26  25  22    Calcium 8.9 - 10.3 mg/dL 9.3  9.5  9.1   Total Protein 6.5 - 8.1 g/dL 7.8   7.7   Total Bilirubin 0.3 - 1.2 mg/dL 0.7   0.6   Alkaline Phos 38 - 126 U/L 69   67   AST 15 - 41 U/L 21   17   ALT 0 - 44 U/L 17   14     Lab Results  Component  Value Date   WBC 2.7 (L) 10/09/2022   HGB 15.4 (H) 10/09/2022   HCT 45.4 10/09/2022   MCV 87.0 10/09/2022   PLT 142 (L) 10/09/2022   NEUTROABS 0.9 (L) 10/09/2022    ASSESSMENT & PLAN:  No problem-specific Assessment & Plan notes found for this encounter.    No orders of the defined types were placed in this encounter.  The patient has a good understanding of the overall plan. she agrees with it. she will call with any problems that may develop before the next visit here. Total time spent: 30 mins including face to face time and time spent for planning, charting and co-ordination of care   10/11/2022, CMA 10/14/22    I Sherlyn Lick am acting as a 10/16/22 for Janan Ridge  ***

## 2022-10-15 ENCOUNTER — Inpatient Hospital Stay: Payer: PRIVATE HEALTH INSURANCE | Attending: Hematology and Oncology | Admitting: Hematology and Oncology

## 2022-10-15 ENCOUNTER — Other Ambulatory Visit: Payer: Self-pay

## 2022-10-15 VITALS — BP 149/83 | HR 77 | Temp 97.7°F | Resp 18 | Ht 62.0 in | Wt 150.8 lb

## 2022-10-15 DIAGNOSIS — D709 Neutropenia, unspecified: Secondary | ICD-10-CM | POA: Insufficient documentation

## 2022-10-15 NOTE — Assessment & Plan Note (Signed)
11/28/2021: WBC 2.9, hemoglobin 14.4, platelets 144, ANC 0.8, ALC 1.7 07/04/2022: WBC 2.6, hemoglobin 14.9, platelets 136, ANC 0.77, ALC 1.46 07/29/2022: WBC 3.2, hemoglobin 14.2, ANC 0.9, CMP normal, B12 332, ANA negative 10/09/2022: WBC 2.7, hemoglobin 15.4, platelets 142, ANC 0.9  Bone marrow biopsy 10/09/2022: Slightly hypercellular bone marrow for age with trilineage hematopoiesis.  Subtle dyspoietic changes in the megakaryocytes (nonspecific) no evidence of lymphoma leukemia or plasma cell neoplasms.  Cellularity 50 to 70%, iron stain: Abundant iron  I reassured the patient that the changes in the white blood cell count are benign and do not constitute any underlying bone marrow pathology.  Therefore she cannot be watched and monitored.  Labs in 6 months and follow-up.

## 2022-10-20 ENCOUNTER — Encounter (HOSPITAL_COMMUNITY): Payer: Self-pay | Admitting: Hematology and Oncology

## 2022-10-27 LAB — SURGICAL PATHOLOGY

## 2023-02-17 ENCOUNTER — Other Ambulatory Visit: Payer: Self-pay | Admitting: Physician Assistant

## 2023-02-17 DIAGNOSIS — Z1231 Encounter for screening mammogram for malignant neoplasm of breast: Secondary | ICD-10-CM

## 2023-02-19 LAB — GLUCOSE, POCT (MANUAL RESULT ENTRY): Glucose Fasting, POC: 94 mg/dL (ref 70–99)

## 2023-02-19 NOTE — Progress Notes (Signed)
Pt was fasting. Pt has PCP. No SDOH needs at this time.

## 2023-03-04 ENCOUNTER — Telehealth: Payer: Self-pay | Admitting: Orthopaedic Surgery

## 2023-03-04 NOTE — Telephone Encounter (Signed)
She needs f/u appt.  Haven't seen her in 6 months.  Thanks.

## 2023-03-04 NOTE — Telephone Encounter (Signed)
Called and LMOM that she needs to call us for a follow up. Unable to proceed with surgery unless we do a follow up first.

## 2023-03-04 NOTE — Telephone Encounter (Signed)
Patient wanting to go a head with Knee surgery. Please advise

## 2023-03-08 ENCOUNTER — Encounter: Payer: Self-pay | Admitting: *Deleted

## 2023-03-08 NOTE — Progress Notes (Signed)
Pt attended 02/19/23 screening event where b/p was 125/65 and blood sugar was 94. Pt did not identify any SDOH insecurities at the event and confirmed Cheryl Bush as her PCP. Per chart review, pt last saw her PCP on 02/23/23 and has had ongoing f/u by oncology over the past year. No additional health equity team support indicated at this time.

## 2023-03-08 NOTE — Progress Notes (Signed)
Office Visit Note   Patient: Cheryl Bush           Date of Birth: Oct 07, 1966           MRN: [id number] Visit Date: 03/09/2023              Requested by: Serena Croissant, MD [address] [place],  [place] [id number] PCP: Norm Salt, [place]   Assessment & Plan: Visit Diagnoses:  1. Primary osteoarthritis of right knee   2. Primary osteoarthritis of left knee     Plan: Impression is severe left knee degenerative joint disease secondary to Osteoarthritis.  Bone on bone joint space narrowing is seen on radiographs with significant varus alignment.  At this point, conservative treatments fail to provide any significant relief and the pain is severely affecting ADLs and quality of life.  Based on treatment options, the patient has elected to move forward with a knee replacement.  We have discussed the surgical risks that include but are not limited to infection, DVT, leg length discrepancy, stiffness, numbness, tingling, incomplete relief of pain.  Recovery and prognosis were also reviewed.    Cheryl Bush will call the patient to confirm surgery time.  Anticoagulants: No antithrombotic Postop anticoagulation: Aspirin 81 mg Diabetic: No  Nickel allergy: No Prior DVT/PE: No Tobacco use: No Clearances needed for surgery: None Anticipated discharge dispo: Home   Follow-Up Instructions: No follow-ups on file.   Orders:  Orders Placed This Encounter  Procedures   XR KNEE 3 VIEW LEFT   No orders of the defined types were placed in this encounter.     Procedures: No procedures performed   Clinical Data: No additional findings.   Subjective: Chief Complaint  Patient presents with   Right Knee - Pain   Left Knee - Pain    HPI Patient is a very pleasant 57 year old female here for left knee pain due to varus DJD.  We saw her last year and did cortisone shots which were ineffective.  She has thought about her options and is interested in looking at knee  replacement surgery. Review of Systems  Constitutional: Negative.   HENT: Negative.    Eyes: Negative.   Respiratory: Negative.    Cardiovascular: Negative.   Endocrine: Negative.   Musculoskeletal: Negative.   Neurological: Negative.   Hematological: Negative.   Psychiatric/Behavioral: Negative.    All other systems reviewed and are negative.    Objective: Vital Signs: Ht 5\' 1"  (1.549 m)   Wt 158 lb (71.7 kg)   LMP 03/08/2013   BMI 29.85 kg/m   Physical Exam Vitals and nursing note reviewed.  Constitutional:      Appearance: She is well-developed.  HENT:     Head: Normocephalic and atraumatic.  Pulmonary:     Effort: Pulmonary effort is normal.  Abdominal:     Palpations: Abdomen is soft.  Musculoskeletal:     Cervical back: Neck supple.  Skin:    General: Skin is warm.     Capillary Refill: Capillary refill takes less than 2 seconds.  Neurological:     Mental Status: She is alert and oriented to person, place, and time.  Psychiatric:        Behavior: Behavior normal.        Thought Content: Thought content normal.        Judgment: Judgment normal.     Ortho Exam Examination of bilateral knees show varus bowing.  Medial joint line tenderness.  Pain and crepitus with range  of motion.  Collaterals and cruciates are stable. Specialty Comments:  No specialty comments available.  Imaging: XR KNEE 3 VIEW LEFT  Result Date: 03/09/2023 Advanced tricompartmental degenerative joint disease.  Bone-on-bone joint space narrowing.  Significant varus deformity    PMFS History: Patient Active Problem List   Diagnosis Date Noted   Primary osteoarthritis of right knee 07/28/2022   Primary osteoarthritis of left knee 07/28/2022   Leukopenia 09/07/2016   Mastitis-left, medial aspect 03/15/2013   Past Medical History:  Diagnosis Date   High cholesterol    Inverted nipple left   abcess removed from areola, causing some scarring and inverted nipple    Family History   Problem Relation Age of Onset   Heart disease Brother     Past Surgical History:  Procedure Laterality Date   KNEE SURGERY     left   Social History   Occupational History   Not on file  Tobacco Use   Smoking status: Never   Smokeless tobacco: Never  Vaping Use   Vaping Use: Never used  Substance and Sexual Activity   Alcohol use: No   Drug use: No   Sexual activity: Never

## 2023-03-09 ENCOUNTER — Other Ambulatory Visit (INDEPENDENT_AMBULATORY_CARE_PROVIDER_SITE_OTHER): Payer: PRIVATE HEALTH INSURANCE

## 2023-03-09 ENCOUNTER — Ambulatory Visit (INDEPENDENT_AMBULATORY_CARE_PROVIDER_SITE_OTHER): Payer: PRIVATE HEALTH INSURANCE | Admitting: Orthopaedic Surgery

## 2023-03-09 ENCOUNTER — Encounter: Payer: Self-pay | Admitting: Orthopaedic Surgery

## 2023-03-09 VITALS — Ht 61.0 in | Wt 158.0 lb

## 2023-03-09 DIAGNOSIS — M1711 Unilateral primary osteoarthritis, right knee: Secondary | ICD-10-CM

## 2023-03-09 DIAGNOSIS — M1712 Unilateral primary osteoarthritis, left knee: Secondary | ICD-10-CM

## 2023-03-16 ENCOUNTER — Telehealth: Payer: Self-pay | Admitting: Orthopaedic Surgery

## 2023-03-16 NOTE — Telephone Encounter (Signed)
Patient is scheduled for left total knee arthroplasty with Dr. Roda Shutters 04-13-23.  Patient has questions about exercise, using stationary bike, and going to the gym (before and after surgery).  Please call patient at  [phone].

## 2023-03-17 NOTE — Telephone Encounter (Signed)
Spoke with patient. He wanted to make sure it was okay to exercise before surgery.

## 2023-03-24 ENCOUNTER — Other Ambulatory Visit: Payer: Self-pay

## 2023-03-26 ENCOUNTER — Telehealth: Payer: Self-pay | Admitting: Orthopaedic Surgery

## 2023-03-26 NOTE — Telephone Encounter (Signed)
Patient's LEFT TOTAL KNEE  case was moved from 04-12-22 to 04-23-23 due to CRNA shortage within [facility].  Patient is requesting a letter for her employer exempting her from having to use stairs.  Patient's contact  [phone]

## 2023-03-26 NOTE — Telephone Encounter (Signed)
Yes approve

## 2023-03-29 NOTE — Telephone Encounter (Signed)
Spoke with patient. Will leave note up front for pick up.

## 2023-03-31 NOTE — Progress Notes (Signed)
Patient Care Team: Norm Salt, PA as PCP - General (Physician Assistant) Serena Croissant, MD as Consulting Physician (Hematology and Oncology)  DIAGNOSIS:  Encounter Diagnosis  Name Primary?   Neutropenia, unspecified type (HCC) Yes      CHIEF COMPLIANT:  Leukopenia   INTERVAL HISTORY: Cheryl Bush is a 57 y.o. female is here because of recent diagnosis of leukopenia.  Patient has had longstanding leukopenia. She presents to the clinic for a follow-up. She reports that she has been tired due to left knee pain.  She also reports to be feeling increasing fatigue.  ALLERGIES:  has No Known Allergies.  MEDICATIONS:  Current Outpatient Medications  Medication Sig Dispense Refill   atorvastatin (LIPITOR) 10 MG tablet Take 10 mg by mouth daily. (Patient not taking: Reported on 04/09/2023)     Boswellia-Glucosamine-Vit D (SM GLUCOSAMINE-VITAMIN D3 PO) Take 2,000 Units by mouth daily. (Patient not taking: Reported on 04/13/2023)     clotrimazole-betamethasone (LOTRISONE) cream Apply 1 application topically 2 (two) times daily. (Patient not taking: Reported on 04/09/2023) 30 g 0   fluconazole (DIFLUCAN) 150 MG tablet Take 1 tablet (150 mg total) by mouth daily. Take one dose today and repeat in 3 days. (Patient not taking: Reported on 04/09/2023) 2 tablet 0   LOVAZA 1 g capsule Take 2 g by mouth every other day.     omeprazole (PRILOSEC) 40 MG capsule Take 40 mg by mouth daily. (Patient not taking: Reported on 04/09/2023)     No current facility-administered medications for this visit.    PHYSICAL EXAMINATION: ECOG PERFORMANCE STATUS: 1 - Symptomatic but completely ambulatory  Vitals:   04/13/23 1537  BP: 120/76  Pulse: 84  Resp: 18  Temp: 97.8 F (36.6 C)  SpO2: 97%   Filed Weights   04/13/23 1537  Weight: 156 lb 6.4 oz (70.9 kg)      LABORATORY DATA:  I have reviewed the data as listed    Latest Ref Rng & Units 04/13/2023    3:10 PM 04/09/2023    9:55 AM 07/29/2022     4:13 PM  CMP  Glucose 70 - 99 mg/dL 161  096  87   BUN 6 - 20 mg/dL 16  14  13    Creatinine 0.44 - 1.00 mg/dL 0.45  4.09  8.11   Sodium 135 - 145 mmol/L 138  137  137   Potassium 3.5 - 5.1 mmol/L 4.0  3.8  4.2   Chloride 98 - 111 mmol/L 105  106  106   CO2 22 - 32 mmol/L 27  21  26    Calcium 8.9 - 10.3 mg/dL 9.4  8.9  9.3   Total Protein 6.5 - 8.1 g/dL 6.8   7.8   Total Bilirubin 0.3 - 1.2 mg/dL 0.3   0.7   Alkaline Phos 38 - 126 U/L 104   69   AST 15 - 41 U/L 17   21   ALT 0 - 44 U/L 14   17     Lab Results  Component Value Date   WBC 3.3 (L) 04/13/2023   HGB 14.5 04/13/2023   HCT 40.3 04/13/2023   MCV 84.3 04/13/2023   PLT 152 04/13/2023   NEUTROABS 1.1 (L) 04/13/2023    ASSESSMENT & PLAN:  Leukopenia Leukopenia 11/28/2021: WBC 2.9, hemoglobin 14.4, platelets 144, ANC 0.8, ALC 1.7 07/04/2022: WBC 2.6, hemoglobin 14.9, platelets 136, ANC 0.77, ALC 1.46 07/29/2022: WBC 3.2, hemoglobin 14.2, ANC 0.9, CMP normal, B12 332, ANA  negative 10/09/2022: WBC 2.7, hemoglobin 15.4, platelets 142, ANC 0.9 04/13/2023: WBC 3.3, hemoglobin 14.5, platelets 152, ANC 1.1   Bone marrow biopsy 10/09/2022: Slightly hypercellular bone marrow for age with trilineage hematopoiesis.  Subtle dyspoietic changes in the megakaryocytes (nonspecific) no evidence of lymphoma leukemia or plasma cell neoplasms.  Cellularity 50 to 70%, iron stain: Abundant iron   I reassured the patient that the changes in the white blood cell count are benign and do not constitute any underlying bone marrow pathology Labs appear to be fairly stable. Labs and follow-up in 6 months    Orders Placed This Encounter  Procedures   CBC with Differential (Cancer Center Only)    Standing Status:   Future    Standing Expiration Date:   04/12/2024   Folate    Standing Status:   Future    Standing Expiration Date:   04/12/2024   Vitamin B12    Standing Status:   Future    Standing Expiration Date:   04/12/2024   The patient has  a good understanding of the overall plan. she agrees with it. she will call with any problems that may develop before the next visit here. Total time spent: 30 mins including face to face time and time spent for planning, charting and co-ordination of care   Tamsen Meek, MD 04/13/23    I Janan Ridge am acting as a Neurosurgeon for [employer]  I have reviewed the above documentation for accuracy and completeness, and I agree with the above.

## 2023-04-07 ENCOUNTER — Telehealth: Payer: Self-pay | Admitting: Orthopaedic Surgery

## 2023-04-07 NOTE — Telephone Encounter (Signed)
Left message on patient's voicemail--asking her to come in today or one day this week for A1-c and Prealbumin screen.  These labs are in preparation for LEFT TKA with Dr. Roda Shutters scheduled 04-23-23.  Patient can come in between the hours of 8am and 11am or 1pm and 4pm.  This visit is a nurse only visit and should not take more than 15 minutes.  This appointment in our office is separate from the pre-admission appointment set by the hospital.  If any questions, please ask for Debbie.

## 2023-04-08 NOTE — Pre-Procedure Instructions (Signed)
Surgical Instructions   Your procedure is scheduled on Friday, June 28th. Report to [facility] Main Entrance "A" at 07:15 A.M., then check in with the Admitting office. Any questions or running late day of surgery: call [phone]  Questions prior to your surgery date: call [phone], Monday-Friday, 8am-4pm. If you experience any cold or flu symptoms such as cough, fever, chills, shortness of breath, etc. between now and your scheduled surgery, please notify us at the above number.     Remember:  Do not eat after midnight the night before your surgery  You may drink clear liquids until 06:45 AM the morning of your surgery.   Clear liquids allowed are: Water, Non-Citrus Juices (without pulp), Carbonated Beverages, Clear Tea, Black Coffee Only (NO MILK, CREAM OR POWDERED CREAMER of any kind), and Gatorade.   Patient Instructions  The night before surgery:  No food after midnight. ONLY clear liquids after midnight  The day of surgery (if you do NOT have diabetes):  Drink ONE (1) Pre-Surgery Clear Ensure by 06:45 AM the morning of surgery. Drink in one sitting. Do not sip.  This drink was given to you during your hospital  pre-op appointment visit.  Nothing else to drink after completing the  Pre-Surgery Clear Ensure.          If you have questions, please contact your surgeon's office.     Take these medicines the morning of surgery with A SIP OF WATER  atorvastatin (LIPITOR)  fluconazole (DIFLUCAN)     One week prior to surgery, STOP taking any Aspirin (unless otherwise instructed by your surgeon) Aleve, Naproxen, Ibuprofen, Motrin, Advil, Goody's, BC's, all herbal medications, fish oil, and non-prescription vitamins.                     Do NOT Smoke (Tobacco/Vaping) for 24 hours prior to your procedure.  If you use a CPAP at night, you may bring your mask/headgear for your overnight stay.   You will be asked to remove any contacts, glasses, piercing's, hearing  aid's, dentures/partials prior to surgery. Please bring cases for these items if needed.    Patients discharged the day of surgery will not be allowed to drive home, and someone needs to stay with them for 24 hours.  SURGICAL WAITING ROOM VISITATION Patients may have no more than 2 support people in the waiting area - these visitors may rotate.   Pre-op nurse will coordinate an appropriate time for 1 ADULT support person, who may not rotate, to accompany patient in pre-op.  Children under the age of 16 must have an adult with them who is not the patient and must remain in the main waiting area with an adult.  If the patient needs to stay at the hospital during part of their recovery, the visitor guidelines for inpatient rooms apply.  Please refer to the [facility] website for the visitor guidelines for any additional information.   If you received a COVID test during your pre-op visit  it is requested that you wear a mask when out in public, stay away from anyone that may not be feeling well and notify your surgeon if you develop symptoms. If you have been in contact with anyone that has tested positive in the last 10 days please notify you surgeon.      Pre-operative 5 CHG Bath Instructions   You can play a key role in reducing the risk of infection after surgery. Your skin needs to be as  free of germs as possible. You can reduce the number of germs on your skin by washing with CHG (chlorhexidine gluconate) soap before surgery. CHG is an antiseptic soap that kills germs and continues to kill germs even after washing.   DO NOT use if you have an allergy to chlorhexidine/CHG or antibacterial soaps. If your skin becomes reddened or irritated, stop using the CHG and notify one of our RNs at [phone].   Please shower with the CHG soap starting 4 days before surgery using the following schedule:     Please keep in mind the following:  DO NOT shave, including legs and underarms, starting  the day of your first shower.   You may shave your face at any point before/day of surgery.  Place clean sheets on your bed the day you start using CHG soap. Use a clean washcloth (not used since being washed) for each shower. DO NOT sleep with pets once you start using the CHG.   CHG Shower Instructions:  If you choose to wash your hair and private area, wash first with your normal shampoo/soap.  After you use shampoo/soap, rinse your hair and body thoroughly to remove shampoo/soap residue.  Turn the water OFF and apply about 3 tablespoons (45 ml) of CHG soap to a CLEAN washcloth.  Apply CHG soap ONLY FROM YOUR NECK DOWN TO YOUR TOES (washing for 3-5 minutes)  DO NOT use CHG soap on face, private areas, open wounds, or sores.  Pay special attention to the area where your surgery is being performed.  If you are having back surgery, having someone wash your back for you may be helpful. Wait 2 minutes after CHG soap is applied, then you may rinse off the CHG soap.  Pat dry with a clean towel  Put on clean clothes/pajamas   If you choose to wear lotion, please use ONLY the CHG-compatible lotions on the back of this paper.   Additional instructions for the day of surgery: DO NOT APPLY any lotions, deodorants, cologne, or perfumes.   Do not bring valuables to the hospital. [facility] is not responsible for any belongings/valuables. Do not wear nail polish, gel polish, artificial nails, or any other type of covering on natural nails (fingers and toes) Do not wear jewelry or makeup Put on clean/comfortable clothes.  Please brush your teeth.  Ask your nurse before applying any prescription medications to the skin.     CHG Compatible Lotions   Aveeno Moisturizing lotion  Cetaphil Moisturizing Cream  Cetaphil Moisturizing Lotion  Clairol Herbal Essence Moisturizing Lotion, Dry Skin  Clairol Herbal Essence Moisturizing Lotion, Extra Dry Skin  Clairol Herbal Essence Moisturizing Lotion,  Normal Skin  Curel Age Defying Therapeutic Moisturizing Lotion with Alpha Hydroxy  Curel Extreme Care Body Lotion  Curel Soothing Hands Moisturizing Hand Lotion  Curel Therapeutic Moisturizing Cream, Fragrance-Free  Curel Therapeutic Moisturizing Lotion, Fragrance-Free  Curel Therapeutic Moisturizing Lotion, Original Formula  Eucerin Daily Replenishing Lotion  Eucerin Dry Skin Therapy Plus Alpha Hydroxy Crme  Eucerin Dry Skin Therapy Plus Alpha Hydroxy Lotion  Eucerin Original Crme  Eucerin Original Lotion  Eucerin Plus Crme Eucerin Plus Lotion  Eucerin TriLipid Replenishing Lotion  Keri Anti-Bacterial Hand Lotion  Keri Deep Conditioning Original Lotion Dry Skin Formula Softly Scented  Keri Deep Conditioning Original Lotion, Fragrance Free Sensitive Skin Formula  Keri Lotion Fast Absorbing Fragrance Free Sensitive Skin Formula  Keri Lotion Fast Absorbing Softly Scented Dry Skin Formula  Keri Original Lotion  Keri Skin Renewal  Lotion WellPoint Smooth Lotion  Keri Silky Smooth Sensitive Skin Lotion  Nivea Body Creamy Conditioning Patent examiner Moisturizing Lotion Nivea Crme  Nivea Skin Firming Lotion  NutraDerm 30 Skin Lotion  NutraDerm Skin Lotion  NutraDerm Therapeutic Skin Cream  NutraDerm Therapeutic Skin Lotion  ProShield Protective Hand Cream  Provon moisturizing lotion  Please read over the following fact sheets that you were given.

## 2023-04-09 ENCOUNTER — Encounter (HOSPITAL_COMMUNITY)
Admission: RE | Admit: 2023-04-09 | Discharge: 2023-04-09 | Disposition: A | Discharge status: Home / Self Care | Payer: PRIVATE HEALTH INSURANCE | Source: Ambulatory Visit | Attending: Orthopaedic Surgery | Admitting: Orthopaedic Surgery

## 2023-04-09 ENCOUNTER — Encounter (HOSPITAL_COMMUNITY): Payer: Self-pay

## 2023-04-09 ENCOUNTER — Other Ambulatory Visit: Payer: Self-pay

## 2023-04-09 VITALS — BP 134/80 | HR 73 | Temp 98.2°F | Resp 17 | Ht 62.0 in | Wt 152.0 lb

## 2023-04-09 DIAGNOSIS — M1712 Unilateral primary osteoarthritis, left knee: Secondary | ICD-10-CM | POA: Insufficient documentation

## 2023-04-09 DIAGNOSIS — Z01818 Encounter for other preprocedural examination: Secondary | ICD-10-CM | POA: Insufficient documentation

## 2023-04-09 LAB — BASIC METABOLIC PANEL
Anion gap: 10 (ref 5–15)
BUN: 14 mg/dL (ref 6–20)
CO2: 21 mmol/L — ABNORMAL LOW (ref 22–32)
Calcium: 8.9 mg/dL (ref 8.9–10.3)
Chloride: 106 mmol/L (ref 98–111)
Creatinine, Ser: 0.64 mg/dL (ref 0.44–1.00)
GFR, Estimated: >60 mL/min (ref >60)
Glucose, Bld: 106 mg/dL — ABNORMAL HIGH (ref 70–99)
Potassium: 3.8 mmol/L (ref 3.5–5.1)
Sodium: 137 mmol/L (ref 135–145)

## 2023-04-09 LAB — CBC
HCT: 44.3 % (ref 36.0–46.0)
Hemoglobin: 15.2 g/dL — ABNORMAL HIGH (ref 12.0–15.0)
MCH: 29.2 pg (ref 26.0–34.0)
MCHC: 34.3 g/dL (ref 30.0–36.0)
MCV: 85 fL (ref 80.0–100.0)
Platelets: 144 10*3/uL — ABNORMAL LOW (ref 150–400)
RBC: 5.21 MIL/uL — ABNORMAL HIGH (ref 3.87–5.11)
RDW: 12.2 % (ref 11.5–15.5)
WBC: 3.1 10*3/uL — ABNORMAL LOW (ref 4.0–10.5)
nRBC: 0 % (ref 0.0–0.2)

## 2023-04-09 LAB — HEMOGLOBIN A1C
Hgb A1c MFr Bld: 4.9 % (ref 4.8–5.6)
Mean Plasma Glucose: 93.93 mg/dL

## 2023-04-09 LAB — SURGICAL PCR SCREEN
MRSA, PCR: NEGATIVE
Staphylococcus aureus: NEGATIVE

## 2023-04-09 LAB — PREALBUMIN: Prealbumin: 23 mg/dL (ref 18–38)

## 2023-04-09 NOTE — Progress Notes (Signed)
PCP - Norva Riffle, PA Cardiologist - denies  PPM/ICD - denies   Chest x-ray - 01/28/2007 EKG - 09/20/19 (n/a) Stress Test - denies ECHO - denies Cardiac Cath - denies  Sleep Study - denies   DM- denies  ASA/Blood Thinner Instructions: n/a   ERAS Protcol - yes PRE-SURGERY Ensure given at PAT  COVID TEST- n/a   Anesthesia review: no  Patient denies shortness of breath, fever, cough and chest pain at PAT appointment   All instructions explained to the patient, with a verbal understanding of the material. Patient agrees to go over the instructions while at home for a better understanding.  The opportunity to ask questions was provided.

## 2023-04-12 ENCOUNTER — Other Ambulatory Visit: Payer: Self-pay | Admitting: *Deleted

## 2023-04-12 DIAGNOSIS — D709 Neutropenia, unspecified: Secondary | ICD-10-CM

## 2023-04-13 ENCOUNTER — Inpatient Hospital Stay: Payer: PRIVATE HEALTH INSURANCE

## 2023-04-13 ENCOUNTER — Other Ambulatory Visit: Payer: Self-pay

## 2023-04-13 ENCOUNTER — Inpatient Hospital Stay: Payer: PRIVATE HEALTH INSURANCE | Attending: Hematology and Oncology | Admitting: Hematology and Oncology

## 2023-04-13 ENCOUNTER — Telehealth: Payer: Self-pay | Admitting: Hematology and Oncology

## 2023-04-13 VITALS — BP 120/76 | HR 84 | Temp 97.8°F | Resp 18 | Ht 62.0 in | Wt 156.4 lb

## 2023-04-13 DIAGNOSIS — D709 Neutropenia, unspecified: Secondary | ICD-10-CM

## 2023-04-13 DIAGNOSIS — M1712 Unilateral primary osteoarthritis, left knee: Secondary | ICD-10-CM

## 2023-04-13 DIAGNOSIS — R5383 Other fatigue: Secondary | ICD-10-CM | POA: Insufficient documentation

## 2023-04-13 LAB — CBC WITH DIFFERENTIAL (CANCER CENTER ONLY)
Abs Immature Granulocytes: 0 10*3/uL (ref 0.00–0.07)
Basophils Absolute: 0 10*3/uL (ref 0.0–0.1)
Basophils Relative: 1 %
Eosinophils Absolute: 0.1 10*3/uL (ref 0.0–0.5)
Eosinophils Relative: 3 %
HCT: 40.3 % (ref 36.0–46.0)
Hemoglobin: 14.5 g/dL (ref 12.0–15.0)
Immature Granulocytes: 0 %
Lymphocytes Relative: 50 %
Lymphs Abs: 1.7 10*3/uL (ref 0.7–4.0)
MCH: 30.3 pg (ref 26.0–34.0)
MCHC: 36 g/dL (ref 30.0–36.0)
MCV: 84.3 fL (ref 80.0–100.0)
Monocytes Absolute: 0.4 10*3/uL (ref 0.1–1.0)
Monocytes Relative: 13 %
Neutro Abs: 1.1 10*3/uL — ABNORMAL LOW (ref 1.7–7.7)
Neutrophils Relative %: 33 %
Platelet Count: 152 10*3/uL (ref 150–400)
RBC: 4.78 MIL/uL (ref 3.87–5.11)
RDW: 12.1 % (ref 11.5–15.5)
WBC Count: 3.3 10*3/uL — ABNORMAL LOW (ref 4.0–10.5)
nRBC: 0 % (ref 0.0–0.2)

## 2023-04-13 LAB — CMP (CANCER CENTER ONLY)
ALT: 14 U/L (ref 0–44)
AST: 17 U/L (ref 15–41)
Albumin: 4.2 g/dL (ref 3.5–5.0)
Alkaline Phosphatase: 104 U/L (ref 38–126)
Anion gap: 6 (ref 5–15)
BUN: 16 mg/dL (ref 6–20)
CO2: 27 mmol/L (ref 22–32)
Calcium: 9.4 mg/dL (ref 8.9–10.3)
Chloride: 105 mmol/L (ref 98–111)
Creatinine: 0.54 mg/dL (ref 0.44–1.00)
GFR, Estimated: >60 mL/min (ref >60)
Glucose, Bld: 112 mg/dL — ABNORMAL HIGH (ref 70–99)
Potassium: 4 mmol/L (ref 3.5–5.1)
Sodium: 138 mmol/L (ref 135–145)
Total Bilirubin: 0.3 mg/dL (ref 0.3–1.2)
Total Protein: 6.8 g/dL (ref 6.5–8.1)

## 2023-04-13 NOTE — Assessment & Plan Note (Signed)
Leukopenia 11/28/2021: WBC 2.9, hemoglobin 14.4, platelets 144, ANC 0.8, ALC 1.7 07/04/2022: WBC 2.6, hemoglobin 14.9, platelets 136, ANC 0.77, ALC 1.46 07/29/2022: WBC 3.2, hemoglobin 14.2, ANC 0.9, CMP normal, B12 332, ANA negative 10/09/2022: WBC 2.7, hemoglobin 15.4, platelets 142, ANC 0.9   Bone marrow biopsy 10/09/2022: Slightly hypercellular bone marrow for age with trilineage hematopoiesis.  Subtle dyspoietic changes in the megakaryocytes (nonspecific) no evidence of lymphoma leukemia or plasma cell neoplasms.  Cellularity 50 to 70%, iron stain: Abundant iron   I reassured the patient that the changes in the white blood cell count are benign and do not constitute any underlying bone marrow pathology  Labs and follow-up in 6 months

## 2023-04-13 NOTE — Telephone Encounter (Signed)
Patient stopped by scheduling after her FU appointment with Dr.Gudena. At this time, the patient does not want to schedule her follow up based off Dr.Gudena's 6/18 los. Patient states she will be having surgery and states she will call to schedule her labs and clinic visit. Scheduler provided the patient with the phone number for scheduling and/or to contact Dr.Gudena's nurse [phone].

## 2023-04-16 ENCOUNTER — Telehealth: Payer: Self-pay | Admitting: Orthopaedic Surgery

## 2023-04-16 NOTE — Telephone Encounter (Signed)
Pt came in to drop off [employer] paperwork only had one sheet (medical request form) she is unsure of how process works gave her [employer] number will have to get her to fill out Auth form Monday she paid $25 cash would like receipt

## 2023-04-19 ENCOUNTER — Other Ambulatory Visit: Payer: Self-pay | Admitting: Physician Assistant

## 2023-04-19 MED ORDER — DOCUSATE SODIUM 100 MG PO CAPS: 100.0000 mg | ORAL_CAPSULE | Freq: Every day | ORAL | 2 refills | Status: AC | PRN

## 2023-04-19 MED ORDER — ONDANSETRON HCL 4 MG PO TABS: 4.0000 mg | ORAL_TABLET | Freq: Three times a day (TID) | ORAL | 0 refills | Status: DC | PRN

## 2023-04-19 MED ORDER — OXYCODONE-ACETAMINOPHEN 5-325 MG PO TABS: 1.0000 | ORAL_TABLET | Freq: Four times a day (QID) | ORAL | 0 refills | Status: DC | PRN

## 2023-04-19 MED ORDER — ASPIRIN 81 MG PO TBEC: 81.0000 mg | DELAYED_RELEASE_TABLET | Freq: Two times a day (BID) | ORAL | 0 refills | Status: AC

## 2023-04-19 MED ORDER — METHOCARBAMOL 750 MG PO TABS: 750.0000 mg | ORAL_TABLET | Freq: Two times a day (BID) | ORAL | 2 refills | Status: DC | PRN

## 2023-04-22 MED ORDER — TRANEXAMIC ACID 1000 MG/10ML IV SOLN
2000.0000 mg | INTRAVENOUS | Status: DC
  Filled 2023-04-22: qty 20

## 2023-04-23 ENCOUNTER — Ambulatory Visit (HOSPITAL_COMMUNITY): Payer: PRIVATE HEALTH INSURANCE | Admitting: Anesthesiology

## 2023-04-23 ENCOUNTER — Encounter (HOSPITAL_COMMUNITY): Payer: Self-pay | Admitting: Orthopaedic Surgery

## 2023-04-23 ENCOUNTER — Other Ambulatory Visit: Payer: Self-pay

## 2023-04-23 ENCOUNTER — Observation Stay (HOSPITAL_COMMUNITY)
Admission: RE | Admit: 2023-04-23 | Discharge: 2023-04-25 | Disposition: A | Discharge status: Home Health | Payer: PRIVATE HEALTH INSURANCE | Attending: Orthopaedic Surgery | Admitting: Orthopaedic Surgery

## 2023-04-23 ENCOUNTER — Encounter (HOSPITAL_COMMUNITY)
Admission: RE | Disposition: A | Discharge status: Home Health | Payer: Self-pay | Source: Home / Self Care | Attending: Orthopaedic Surgery

## 2023-04-23 ENCOUNTER — Observation Stay (HOSPITAL_COMMUNITY): Payer: PRIVATE HEALTH INSURANCE

## 2023-04-23 DIAGNOSIS — M1712 Unilateral primary osteoarthritis, left knee: Secondary | ICD-10-CM

## 2023-04-23 DIAGNOSIS — Z79899 Other long term (current) drug therapy: Secondary | ICD-10-CM | POA: Diagnosis not present

## 2023-04-23 DIAGNOSIS — Z7982 Long term (current) use of aspirin: Secondary | ICD-10-CM | POA: Insufficient documentation

## 2023-04-23 DIAGNOSIS — Z96652 Presence of left artificial knee joint: Secondary | ICD-10-CM

## 2023-04-23 HISTORY — PX: TOTAL KNEE ARTHROPLASTY: SHX125

## 2023-04-23 SURGERY — ARTHROPLASTY, KNEE, TOTAL
Anesthesia: Spinal | Site: Knee | Laterality: Left

## 2023-04-23 MED ORDER — ONDANSETRON HCL 4 MG/2ML IJ SOLN
4.0000 mg | Freq: Four times a day (QID) | INTRAMUSCULAR | Status: DC | PRN
  Administered 2023-04-23: 4 mg via INTRAVENOUS
  Filled 2023-04-23 (×2): qty 2

## 2023-04-23 MED ORDER — 0.9 % SODIUM CHLORIDE (POUR BTL) OPTIME
TOPICAL | Status: DC | PRN
  Administered 2023-04-23: 1000 mL

## 2023-04-23 MED ORDER — GABAPENTIN 300 MG PO CAPS
300.0000 mg | ORAL_CAPSULE | Freq: Once | ORAL | Status: AC
  Administered 2023-04-23: 300 mg via ORAL
  Filled 2023-04-23: qty 1

## 2023-04-23 MED ORDER — FENTANYL CITRATE (PF) 100 MCG/2ML IJ SOLN
INTRAMUSCULAR | Status: AC
  Administered 2023-04-23: 50 ug via INTRAVENOUS
  Filled 2023-04-23: qty 2

## 2023-04-23 MED ORDER — CEFAZOLIN SODIUM-DEXTROSE 2-4 GM/100ML-% IV SOLN
2.0000 g | Freq: Four times a day (QID) | INTRAVENOUS | Status: AC
  Administered 2023-04-23 (×2): 2 g via INTRAVENOUS
  Filled 2023-04-23 (×2): qty 100

## 2023-04-23 MED ORDER — DOCUSATE SODIUM 100 MG PO CAPS
100.0000 mg | ORAL_CAPSULE | Freq: Two times a day (BID) | ORAL | Status: DC
  Administered 2023-04-23 – 2023-04-25 (×5): 100 mg via ORAL
  Filled 2023-04-23 (×5): qty 1

## 2023-04-23 MED ORDER — BUPIVACAINE-MELOXICAM ER 400-12 MG/14ML IJ SOLN
INTRAMUSCULAR | Status: AC
  Filled 2023-04-23: qty 1

## 2023-04-23 MED ORDER — OXYCODONE HCL 5 MG PO TABS: 5.0000 mg | ORAL_TABLET | ORAL | Status: DC | PRN

## 2023-04-23 MED ORDER — FENTANYL CITRATE (PF) 250 MCG/5ML IJ SOLN
INTRAMUSCULAR | Status: DC | PRN
  Administered 2023-04-23: 50 ug via INTRAVENOUS

## 2023-04-23 MED ORDER — MIDAZOLAM HCL 2 MG/2ML IJ SOLN
INTRAMUSCULAR | Status: AC
  Administered 2023-04-23: 1 mg via INTRAVENOUS
  Filled 2023-04-23: qty 2

## 2023-04-23 MED ORDER — MIDAZOLAM HCL 2 MG/2ML IJ SOLN
INTRAMUSCULAR | Status: DC | PRN
  Administered 2023-04-23: 1 mg via INTRAVENOUS

## 2023-04-23 MED ORDER — ACETAMINOPHEN 500 MG PO TABS
1000.0000 mg | ORAL_TABLET | Freq: Once | ORAL | Status: AC
  Administered 2023-04-23: 1000 mg via ORAL
  Filled 2023-04-23: qty 2

## 2023-04-23 MED ORDER — CLONIDINE HCL (ANALGESIA) 100 MCG/ML EP SOLN
EPIDURAL | Status: DC | PRN
  Administered 2023-04-23: 80 ug

## 2023-04-23 MED ORDER — PROPOFOL 500 MG/50ML IV EMUL
INTRAVENOUS | Status: DC | PRN
  Administered 2023-04-23: 50 ug/kg/min via INTRAVENOUS

## 2023-04-23 MED ORDER — ONDANSETRON HCL 4 MG/2ML IJ SOLN
INTRAMUSCULAR | Status: DC | PRN
  Administered 2023-04-23 (×2): 4 mg via INTRAVENOUS

## 2023-04-23 MED ORDER — FENTANYL CITRATE (PF) 250 MCG/5ML IJ SOLN
INTRAMUSCULAR | Status: AC
  Filled 2023-04-23: qty 5

## 2023-04-23 MED ORDER — ACETAMINOPHEN 325 MG PO TABS: 325.0000 mg | ORAL_TABLET | Freq: Four times a day (QID) | ORAL | Status: DC | PRN

## 2023-04-23 MED ORDER — SODIUM CHLORIDE 0.9 % IV SOLN: INTRAVENOUS | Status: DC

## 2023-04-23 MED ORDER — MEPERIDINE HCL 25 MG/ML IJ SOLN: 6.2500 mg | INTRAMUSCULAR | Status: DC | PRN

## 2023-04-23 MED ORDER — DEXAMETHASONE SODIUM PHOSPHATE 10 MG/ML IJ SOLN
10.0000 mg | Freq: Once | INTRAMUSCULAR | Status: AC
  Administered 2023-04-24: 10 mg via INTRAVENOUS
  Filled 2023-04-23: qty 1

## 2023-04-23 MED ORDER — DEXAMETHASONE SODIUM PHOSPHATE 10 MG/ML IJ SOLN
INTRAMUSCULAR | Status: DC | PRN
  Administered 2023-04-23: 10 mg via INTRAVENOUS

## 2023-04-23 MED ORDER — PHENOL 1.4 % MT LIQD: 1.0000 | OROMUCOSAL | Status: DC | PRN

## 2023-04-23 MED ORDER — MIDAZOLAM HCL 2 MG/2ML IJ SOLN
INTRAMUSCULAR | Status: AC
  Filled 2023-04-23: qty 2

## 2023-04-23 MED ORDER — KETOROLAC TROMETHAMINE 15 MG/ML IJ SOLN
15.0000 mg | Freq: Four times a day (QID) | INTRAMUSCULAR | Status: AC
  Administered 2023-04-23 – 2023-04-24 (×3): 15 mg via INTRAVENOUS
  Filled 2023-04-23 (×3): qty 1

## 2023-04-23 MED ORDER — METOCLOPRAMIDE HCL 5 MG/ML IJ SOLN: 5.0000 mg | Freq: Three times a day (TID) | INTRAMUSCULAR | Status: DC | PRN

## 2023-04-23 MED ORDER — OXYCODONE HCL 5 MG PO TABS: 10.0000 mg | ORAL_TABLET | ORAL | Status: DC | PRN

## 2023-04-23 MED ORDER — STERILE WATER FOR IRRIGATION IR SOLN
Status: DC | PRN
  Administered 2023-04-23: 1000 mL

## 2023-04-23 MED ORDER — CHLORHEXIDINE GLUCONATE 0.12 % MT SOLN
OROMUCOSAL | Status: AC
  Administered 2023-04-23: 15 mL
  Filled 2023-04-23: qty 15

## 2023-04-23 MED ORDER — OXYCODONE HCL ER 10 MG PO T12A
10.0000 mg | EXTENDED_RELEASE_TABLET | Freq: Two times a day (BID) | ORAL | Status: DC
  Administered 2023-04-23 – 2023-04-25 (×4): 10 mg via ORAL
  Filled 2023-04-23 (×5): qty 1

## 2023-04-23 MED ORDER — PROPOFOL 10 MG/ML IV BOLUS: INTRAVENOUS | Status: DC | PRN

## 2023-04-23 MED ORDER — HYDROMORPHONE HCL 1 MG/ML IJ SOLN: 0.2500 mg | INTRAMUSCULAR | Status: DC | PRN

## 2023-04-23 MED ORDER — TRANEXAMIC ACID 1000 MG/10ML IV SOLN
INTRAVENOUS | Status: DC | PRN
  Administered 2023-04-23: 2000 mg via TOPICAL

## 2023-04-23 MED ORDER — VANCOMYCIN HCL 1000 MG IV SOLR
INTRAVENOUS | Status: AC
  Filled 2023-04-23: qty 20

## 2023-04-23 MED ORDER — METHOCARBAMOL 500 MG PO TABS
500.0000 mg | ORAL_TABLET | Freq: Four times a day (QID) | ORAL | Status: DC | PRN
  Administered 2023-04-23 – 2023-04-24 (×2): 500 mg via ORAL
  Filled 2023-04-23 (×2): qty 1

## 2023-04-23 MED ORDER — TRANEXAMIC ACID-NACL 1000-0.7 MG/100ML-% IV SOLN
1000.0000 mg | INTRAVENOUS | Status: AC
  Administered 2023-04-23: 1000 mg via INTRAVENOUS
  Filled 2023-04-23: qty 100

## 2023-04-23 MED ORDER — BUPIVACAINE IN DEXTROSE 0.75-8.25 % IT SOLN
INTRATHECAL | Status: DC | PRN
  Administered 2023-04-23: 1.6 mL via INTRATHECAL

## 2023-04-23 MED ORDER — METOCLOPRAMIDE HCL 5 MG PO TABS: 5.0000 mg | ORAL_TABLET | Freq: Three times a day (TID) | ORAL | Status: DC | PRN

## 2023-04-23 MED ORDER — FERROUS SULFATE 325 (65 FE) MG PO TABS
325.0000 mg | ORAL_TABLET | Freq: Three times a day (TID) | ORAL | Status: DC
  Administered 2023-04-23 – 2023-04-25 (×5): 325 mg via ORAL
  Filled 2023-04-23 (×5): qty 1

## 2023-04-23 MED ORDER — FENTANYL CITRATE (PF) 100 MCG/2ML IJ SOLN: 50.0000 ug | Freq: Once | INTRAMUSCULAR | Status: AC

## 2023-04-23 MED ORDER — ROPIVACAINE HCL 5 MG/ML IJ SOLN
INTRAMUSCULAR | Status: DC | PRN
  Administered 2023-04-23: 30 mL via PERINEURAL

## 2023-04-23 MED ORDER — MIDAZOLAM HCL 2 MG/2ML IJ SOLN: 1.0000 mg | Freq: Once | INTRAMUSCULAR | Status: AC

## 2023-04-23 MED ORDER — TRANEXAMIC ACID-NACL 1000-0.7 MG/100ML-% IV SOLN
1000.0000 mg | Freq: Once | INTRAVENOUS | Status: AC
  Administered 2023-04-23: 1000 mg via INTRAVENOUS
  Filled 2023-04-23: qty 100

## 2023-04-23 MED ORDER — LACTATED RINGERS IV SOLN: INTRAVENOUS | Status: DC

## 2023-04-23 MED ORDER — ONDANSETRON HCL 4 MG PO TABS: 4.0000 mg | ORAL_TABLET | Freq: Four times a day (QID) | ORAL | Status: DC | PRN

## 2023-04-23 MED ORDER — CEFAZOLIN SODIUM-DEXTROSE 2-4 GM/100ML-% IV SOLN
2.0000 g | INTRAVENOUS | Status: AC
  Administered 2023-04-23: 2 g via INTRAVENOUS
  Filled 2023-04-23: qty 100

## 2023-04-23 MED ORDER — POVIDONE-IODINE 10 % EX SWAB
2.0000 | Freq: Once | CUTANEOUS | Status: AC
  Administered 2023-04-23: 2 via TOPICAL

## 2023-04-23 MED ORDER — ASPIRIN 81 MG PO CHEW
81.0000 mg | CHEWABLE_TABLET | Freq: Two times a day (BID) | ORAL | Status: DC
  Administered 2023-04-23 – 2023-04-25 (×4): 81 mg via ORAL
  Filled 2023-04-23 (×4): qty 1

## 2023-04-23 MED ORDER — ACETAMINOPHEN 500 MG PO TABS
1000.0000 mg | ORAL_TABLET | Freq: Four times a day (QID) | ORAL | Status: AC
  Administered 2023-04-23 – 2023-04-24 (×3): 1000 mg via ORAL
  Filled 2023-04-23 (×4): qty 2

## 2023-04-23 MED ORDER — PROMETHAZINE HCL 25 MG/ML IJ SOLN: 6.2500 mg | INTRAMUSCULAR | Status: DC | PRN

## 2023-04-23 MED ORDER — MENTHOL 3 MG MT LOZG: 1.0000 | LOZENGE | OROMUCOSAL | Status: DC | PRN

## 2023-04-23 MED ORDER — AMISULPRIDE (ANTIEMETIC) 5 MG/2ML IV SOLN: 10.0000 mg | Freq: Once | INTRAVENOUS | Status: DC | PRN

## 2023-04-23 MED ORDER — DEXAMETHASONE SODIUM PHOSPHATE 10 MG/ML IJ SOLN: 10.0000 mg | Freq: Once | INTRAMUSCULAR | Status: DC

## 2023-04-23 MED ORDER — BUPIVACAINE-MELOXICAM ER 400-12 MG/14ML IJ SOLN
INTRAMUSCULAR | Status: DC | PRN
  Administered 2023-04-23: 400 mg

## 2023-04-23 MED ORDER — SODIUM CHLORIDE 0.9 % IR SOLN
Status: DC | PRN
  Administered 2023-04-23: 1000 mL

## 2023-04-23 MED ORDER — PRONTOSAN WOUND IRRIGATION OPTIME
TOPICAL | Status: DC | PRN
  Administered 2023-04-23: 350 mL

## 2023-04-23 MED ORDER — DEXAMETHASONE SODIUM PHOSPHATE 4 MG/ML IJ SOLN
INTRAMUSCULAR | Status: DC | PRN
  Administered 2023-04-23: 5 mg via PERINEURAL

## 2023-04-23 MED ORDER — PHENYLEPHRINE HCL-NACL 20-0.9 MG/250ML-% IV SOLN
INTRAVENOUS | Status: DC | PRN
  Administered 2023-04-23: 30 ug/min via INTRAVENOUS

## 2023-04-23 MED ORDER — VANCOMYCIN HCL 1000 MG IV SOLR
INTRAVENOUS | Status: DC | PRN
  Administered 2023-04-23: 1000 mg via TOPICAL

## 2023-04-23 MED ORDER — HYDROMORPHONE HCL 1 MG/ML IJ SOLN: 0.5000 mg | INTRAMUSCULAR | Status: DC | PRN

## 2023-04-23 MED ORDER — METHOCARBAMOL 1000 MG/10ML IJ SOLN: 500.0000 mg | Freq: Four times a day (QID) | INTRAVENOUS | Status: DC | PRN

## 2023-04-23 SURGICAL SUPPLY — 87 items
ADH SKN CLS APL DERMABOND .7 (GAUZE/BANDAGES/DRESSINGS) ×1
ALCOHOL 70% 16 OZ (MISCELLANEOUS) ×1 IMPLANT
BAG COUNTER SPONGE SURGICOUNT (BAG) IMPLANT
BAG DECANTER FOR FLEXI CONT (MISCELLANEOUS) ×1 IMPLANT
BAG SPNG CNTER NS LX DISP (BAG)
BANDAGE ESMARK 6X9 LF (GAUZE/BANDAGES/DRESSINGS) IMPLANT
BLADE SAG 18X100X1.27 (BLADE) ×1 IMPLANT
BLADE SAW SGTL 73X25 THK (BLADE) ×1 IMPLANT
BNDG CMPR 9X6 STRL LF SNTH (GAUZE/BANDAGES/DRESSINGS)
BNDG ESMARK 6X9 LF (GAUZE/BANDAGES/DRESSINGS)
BOWL SMART MIX CTS (DISPOSABLE) ×1 IMPLANT
CEMENT BONE REFOBACIN R1X40 US (Cement) IMPLANT
CLSR STERI-STRIP ANTIMIC 1/2X4 (GAUZE/BANDAGES/DRESSINGS) ×2 IMPLANT
COMP FEM KNEE PS STD 6 LT (Joint) ×1 IMPLANT
COMP PATELLA NEXG KNEE 29 10 (Joint) ×1 IMPLANT
COMP TIB PS KNEE C 0D LT (Joint) ×1 IMPLANT
COMPONENT FEM KNEE PS STD 6 LT (Joint) IMPLANT
COMPONENT PTLLA NEXG KN 29 10 (Joint) IMPLANT
COMPONET TIB PS KNEE C 0D LT (Joint) IMPLANT
COOLER ICEMAN CLASSIC (MISCELLANEOUS) ×1 IMPLANT
COVER SURGICAL LIGHT HANDLE (MISCELLANEOUS) ×1 IMPLANT
CUFF TOURN SGL QUICK 34 (TOURNIQUET CUFF) ×1
CUFF TOURN SGL QUICK 42 (TOURNIQUET CUFF) IMPLANT
CUFF TRNQT CYL 34X4.125X (TOURNIQUET CUFF) ×1 IMPLANT
DERMABOND ADVANCED .7 DNX12 (GAUZE/BANDAGES/DRESSINGS) ×1 IMPLANT
DRAPE EXTREMITY T 121X128X90 (DISPOSABLE) ×1 IMPLANT
DRAPE HALF SHEET 40X57 (DRAPES) ×1 IMPLANT
DRAPE INCISE IOBAN 66X45 STRL (DRAPES) ×1 IMPLANT
DRAPE ORTHO SPLIT 77X108 STRL (DRAPES) ×2
DRAPE POUCH INSTRU U-SHP 10X18 (DRAPES) ×1 IMPLANT
DRAPE SURG ORHT 6 SPLT 77X108 (DRAPES) IMPLANT
DRAPE U-SHAPE 47X51 STRL (DRAPES) ×2 IMPLANT
DRSG AQUACEL AG ADV 3.5X10 (GAUZE/BANDAGES/DRESSINGS) ×1 IMPLANT
DURAPREP 26ML APPLICATOR (WOUND CARE) ×3 IMPLANT
ELECT CAUTERY BLADE 6.4 (BLADE) ×1 IMPLANT
ELECT PENCIL ROCKER SW 15FT (MISCELLANEOUS) ×1 IMPLANT
ELECT REM PT RETURN 9FT ADLT (ELECTROSURGICAL) ×1
ELECTRODE REM PT RTRN 9FT ADLT (ELECTROSURGICAL) ×1 IMPLANT
GLOVE BIOGEL PI IND STRL 7.0 (GLOVE) ×2 IMPLANT
GLOVE BIOGEL PI IND STRL 7.5 (GLOVE) ×5 IMPLANT
GLOVE ECLIPSE 7.0 STRL STRAW (GLOVE) ×3 IMPLANT
GLOVE INDICATOR 7.0 STRL GRN (GLOVE) ×1 IMPLANT
GLOVE INDICATOR 7.5 STRL GRN (GLOVE) ×1 IMPLANT
GLOVE SURG SYN 7.5 E (GLOVE) ×2 IMPLANT
GLOVE SURG SYN 7.5 PF PI (GLOVE) ×2 IMPLANT
GLOVE SURG UNDER LTX SZ7.5 (GLOVE) ×2 IMPLANT
GLOVE SURG UNDER POLY LF SZ7 (GLOVE) ×2 IMPLANT
GOWN STRL REUS W/ TWL LRG LVL3 (GOWN DISPOSABLE) ×1 IMPLANT
GOWN STRL REUS W/TWL LRG LVL3 (GOWN DISPOSABLE) ×1
GOWN STRL SURGICAL XL XLNG (GOWN DISPOSABLE) ×1 IMPLANT
GOWN TOGA ZIPPER T7+ PEEL AWAY (MISCELLANEOUS) ×2 IMPLANT
HANDPIECE INTERPULSE COAX TIP (DISPOSABLE) ×1
HOOD PEEL AWAY T7 (MISCELLANEOUS) ×1 IMPLANT
INSERT TIB ASF 14 6-7/CD LT (Insert) IMPLANT
KIT BASIN OR (CUSTOM PROCEDURE TRAY) ×1 IMPLANT
KIT TURNOVER KIT B (KITS) ×1 IMPLANT
MANIFOLD NEPTUNE II (INSTRUMENTS) ×1 IMPLANT
MARKER SKIN DUAL TIP RULER LAB (MISCELLANEOUS) ×2 IMPLANT
NDL SPNL 18GX3.5 QUINCKE PK (NEEDLE) ×1 IMPLANT
NEEDLE SPNL 18GX3.5 QUINCKE PK (NEEDLE) ×1 IMPLANT
NS IRRIG 1000ML POUR BTL (IV SOLUTION) ×1 IMPLANT
PACK TOTAL JOINT (CUSTOM PROCEDURE TRAY) ×1 IMPLANT
PAD ARMBOARD 7.5X6 YLW CONV (MISCELLANEOUS) ×2 IMPLANT
PAD COLD SHLDR WRAP-ON (PAD) ×1 IMPLANT
PIN DRILL HDLS TROCAR 75 4PK (PIN) IMPLANT
SCREW FEMALE HEX FIX 25X2.5 (ORTHOPEDIC DISPOSABLE SUPPLIES) IMPLANT
SET HNDPC FAN SPRY TIP SCT (DISPOSABLE) ×1 IMPLANT
SOLUTION PRONTOSAN WOUND 350ML (IRRIGATION / IRRIGATOR) ×1 IMPLANT
STAPLER VISISTAT 35W (STAPLE) IMPLANT
SUCTION TUBE FRAZIER 10FR DISP (SUCTIONS) ×1 IMPLANT
SUT ETHILON 2 0 FS 18 (SUTURE) IMPLANT
SUT ETHILON 2 0 PSLX (SUTURE) IMPLANT
SUT MNCRL AB 3-0 PS2 27 (SUTURE) IMPLANT
SUT VIC AB 0 CT1 27 (SUTURE) ×2
SUT VIC AB 0 CT1 27XBRD ANBCTR (SUTURE) ×2 IMPLANT
SUT VIC AB 1 CTX 27 (SUTURE) ×3 IMPLANT
SUT VIC AB 2-0 CT1 27 (SUTURE) ×4
SUT VIC AB 2-0 CT1 TAPERPNT 27 (SUTURE) ×4 IMPLANT
SYR 50ML LL SCALE MARK (SYRINGE) ×2 IMPLANT
TOWEL GREEN STERILE (TOWEL DISPOSABLE) ×1 IMPLANT
TOWEL GREEN STERILE FF (TOWEL DISPOSABLE) ×1 IMPLANT
TRAY CATH INTERMITTENT SS 16FR (CATHETERS) IMPLANT
TUBE SUCT ARGYLE STRL (TUBING) ×1 IMPLANT
UNDERPAD 30X36 HEAVY ABSORB (UNDERPADS AND DIAPERS) ×1 IMPLANT
WARMER LAPAROSCOPE (MISCELLANEOUS) IMPLANT
WATER STERILE IRR 1000ML POUR (IV SOLUTION) IMPLANT
YANKAUER SUCT BULB TIP NO VENT (SUCTIONS) ×2 IMPLANT

## 2023-04-23 NOTE — Anesthesia Procedure Notes (Signed)
Spinal  Patient location during procedure: OR Start time: 04/23/2023 9:41 AM End time: 04/23/2023 9:46 AM Reason for block: surgical anesthesia Staffing Performed: anesthesiologist  Anesthesiologist: Lewie Loron, MD Performed by: Lewie Loron, MD Authorized by: Lewie Loron, MD   Preanesthetic Checklist Completed: patient identified, IV checked, site marked, risks and benefits discussed, surgical consent, monitors and equipment checked, pre-op evaluation and timeout performed Spinal Block Patient position: sitting Prep: DuraPrep and site prepped and draped Patient monitoring: heart rate, continuous pulse ox and blood pressure Approach: midline Location: L3-4 Injection technique: single-shot Needle Needle type: Spinocan  Needle gauge: 25 G Needle length: 9 cm Assessment Sensory level: T8 Additional Notes Expiration date of kit checked and confirmed. Patient tolerated procedure well, without complications.

## 2023-04-23 NOTE — Transfer of Care (Signed)
Immediate Anesthesia Transfer of Care Note  Patient: Cheryl Bush  Procedure(s) Performed: LEFT TOTAL KNEE ARTHROPLASTY (Left: Knee)  Patient Location: PACU  Anesthesia Type:MAC and Spinal  Level of Consciousness: awake  Airway & Oxygen Therapy: Patient Spontanous Breathing  Post-op Assessment: Report given to RN and Post -op Vital signs reviewed and stable  Post vital signs: Reviewed and stable  Last Vitals:  Vitals Value Taken Time  BP 115/81 04/23/23 1145  Temp    Pulse 81 04/23/23 1146  Resp 16 04/23/23 1146  SpO2 93 % 04/23/23 1146  Vitals shown include unvalidated device data.  Last Pain:  Vitals:   04/23/23 0725  TempSrc:   PainSc: 0-No pain         Complications: No notable events documented.

## 2023-04-23 NOTE — Discharge Instructions (Signed)

## 2023-04-23 NOTE — Anesthesia Procedure Notes (Signed)
Anesthesia Regional Block: Adductor canal block   Pre-Anesthetic Checklist: , timeout performed,  Correct Patient, Correct Site, Correct Laterality,  Correct Procedure, Correct Position, site marked,  Risks and benefits discussed,  Surgical consent,  Pre-op evaluation,  At surgeon's request and post-op pain management  Laterality: Lower and Left  Prep: chloraprep       Needles:  Injection technique: Single-shot  Needle Type: Stimiplex     Needle Length: 9cm  Needle Gauge: 21     Additional Needles:   Procedures:,,,, ultrasound used (permanent image in chart),,    Narrative:  Start time: 04/23/2023 8:36 AM End time: 04/23/2023 8:56 AM Injection made incrementally with aspirations every 5 mL.  Performed by: Personally  Anesthesiologist: Lewie Loron, MD  Additional Notes: BP cuff, EKG monitors applied. Sedation begun. Artery and nerve location verified with ultrasound. Anesthetic injected incrementally (5ml), slowly, and after negative aspirations under direct u/s guidance. Good fascial/perineural spread. Tolerated well.

## 2023-04-23 NOTE — Op Note (Signed)
Total Knee Arthroplasty Procedure Note  Preoperative diagnosis: Left knee osteoarthritis  Postoperative diagnosis:same  Operative findings: Complete loss of joint space Excellent bone quality Varus deformity  Operative procedure: Left total knee arthroplasty. CPT [phone]  Surgeon: N. Glee Arvin, MD  Assist: Oneal Grout, PA-C; necessary for the timely completion of procedure and due to complexity of procedure.  Anesthesia: Spinal, regional  Tourniquet time: see anesthesia record  Implants used: Zimmer persona press fit Femur: CR 6 Tibia: C Patella: 29 mm Polyethylene: 14 mm medial congruent  Indication: Cheryl Bush is a 57 y.o. year old female with a history of knee pain. Having failed conservative management, the patient elected to proceed with a total knee arthroplasty.  We have reviewed the risk and benefits of the surgery and they elected to proceed after voicing understanding.  Procedure:  After informed consent was obtained and understanding of the risk were voiced including but not limited to bleeding, infection, damage to surrounding structures including nerves and vessels, blood clots, leg length inequality and the failure to achieve desired results, the operative extremity was marked with verbal confirmation of the patient in the holding area.   The patient was then brought to the operating room and transported to the operating room table in the supine position.  A tourniquet was applied to the operative extremity around the upper thigh. The operative limb was then prepped and draped in the usual sterile fashion and preoperative antibiotics were administered.  A time out was performed prior to the start of surgery confirming the correct extremity, preoperative antibiotic administration, as well as team members, implants and instruments available for the case. Correct surgical site was also confirmed with preoperative radiographs. The limb was then elevated  for exsanguination and the tourniquet was inflated. A midline incision was made and a standard medial parapatellar approach was performed.  The patella was prepared and sized to a 29 mm.  A cover was placed on the patella for protection from retractors.  We then turned our attention to the femur. Posterior cruciate ligament was sacrificed. Start site was drilled in the femur and the intramedullary distal femoral cutting guide was placed, set at 3 degrees valgus, taking 10 mm of distal resection. The distal cut was made. Osteophytes were then removed.   Next, the proximal tibial cutting guide was placed with appropriate slope, varus/valgus alignment and depth of resection. The proximal tibial cut was made taking 2 mm off the low side. Gap blocks were then used to assess the extension gap and alignment, and appropriate soft tissue releases were performed. Attention was turned back to the femur, which was sized using the sizing guide to a size 6. Appropriate rotation of the femoral component was determined using epicondylar axis, Whiteside's line, and assessing the flexion gap under ligament tension. The appropriate size 4-in-1 cutting block was placed and cuts were made.  Posterior femoral osteophytes and uncapped bone were then removed with the curved osteotome.  Trial components were placed, and stability was checked in full extension, mid-flexion, and deep flexion. Proper tibial rotation was determined and marked.  The patella tracked well without a lateral release. Trial components were then removed and tibial preparation performed.  The tibia was sized for a size C component.  The bony surfaces were irrigated with a pulse lavage and then dried. Final components were placed.  The stability of the construct was re-evaluated throughout a range of motion and found to be acceptable. The trial liner was removed, the knee  was copiously irrigated, and the knee was re-evaluated for any excess bone debris. The real  polyethylene liner, 14 mm thick, was inserted and checked to ensure the locking mechanism had engaged appropriately. The tourniquet was deflated and hemostasis was achieved. The wound was irrigated with normal saline.  One gram of vancomycin powder was placed in the surgical bed.  Topical 0.25% bupivacaine and meloxicam was placed in the joint for postoperative pain.  Capsular closure was performed with a #1 vicryl, subcutaneous fat closed with a 0 vicryl suture, then subcutaneous tissue closed with interrupted 2.0 vicryl suture. The skin was then closed with a 2.0 nylon and dermabond. A sterile dressing was applied.  The patient was awakened in the operating room and taken to recovery in stable condition. All sponge, needle, and instrument counts were correct at the end of the case.  [name] was necessary for opening, closing, retracting, limb positioning and overall facilitation and completion of the surgery.  Position: supine  Complications: none.  Time Out: performed   Drains/Packing: none  Estimated blood loss: minimal  Returned to Recovery Room: in good condition.   Antibiotics: yes   Mechanical VTE (DVT) Prophylaxis: sequential compression devices, TED thigh-high  Chemical VTE (DVT) Prophylaxis: aspirin  Fluid Replacement  Crystalloid: see anesthesia record Blood: none  FFP: none   Specimens Removed: 1 to pathology   Sponge and Instrument Count Correct? yes   PACU: portable radiograph - knee AP and Lateral   Plan/RTC: Return in 2 weeks for wound check.   Weight Bearing/Load Lower Extremity: full   Implant Name Type Inv. Item Serial No. Manufacturer Lot No. LRB No. Used Action  COMP FEM KNEE PS STD 6 LT - [id number] Joint COMP FEM KNEE PS STD 6 LT  ZIMMER [employer]ON(ORTH,TRAU,BIO,SG) [id number] Left 1 Implanted  COMP TIB PS KNEE C 0D LT - [id number] Joint COMP TIB PS KNEE C 0D LT  ZIMMER [employer]ON(ORTH,TRAU,BIO,SG) [id number] Left 1 Implanted  INSERT TIB ASF 14 6-7/CD LT -  [id number] Insert INSERT TIB ASF 14 6-7/CD LT  ZIMMER [employer]ON(ORTH,TRAU,BIO,SG) [id number] Left 1 Implanted  COMP PATELLA NEXG KNEE 29 10 - [id number] Joint COMP PATELLA NEXG KNEE 29 10  ZIMMER [employer]ON(ORTH,TRAU,BIO,SG)  Left 1 Implanted    N. Glee Arvin, MD [facility] 2:53 PM

## 2023-04-23 NOTE — H&P (Signed)
PREOPERATIVE H&P  Chief Complaint: left knee osteoarthritis  HPI: Cheryl Bush is a 57 y.o. female who presents for surgical treatment of left knee osteoarthritis.  She denies any changes in medical history.  Past Medical History:  Diagnosis Date   High cholesterol    Inverted nipple left   abcess removed from areola, causing some scarring and inverted nipple   Past Surgical History:  Procedure Laterality Date   KNEE SURGERY     left- arthroscopy   Social History   Socioeconomic History   Marital status: Married    Spouse name: Not on file   Number of children: 1   Years of education: Not on file   Highest education level: Not on file  Occupational History   Not on file  Tobacco Use   Smoking status: Never   Smokeless tobacco: Never  Vaping Use   Vaping Use: Never used  Substance and Sexual Activity   Alcohol use: No   Drug use: No   Sexual activity: Never  Other Topics Concern   Not on file  Social History Narrative   Not on file   Social Determinants of Health   Financial Resource Strain: Not on file  Food Insecurity: No Food Insecurity (02/19/2023)   Hunger Vital Sign    Worried About Running Out of Food in the Last Year: Never true    Ran Out of Food in the Last Year: Never true  Transportation Needs: No Transportation Needs (02/19/2023)   PRAPARE - Administrator, Civil Service (Medical): No    Lack of Transportation (Non-Medical): No  Physical Activity: Not on file  Stress: Not on file  Social Connections: Not on file   Family History  Problem Relation Age of Onset   Heart disease Brother    No Known Allergies Prior to Admission medications   Medication Sig Start Date End Date Taking? Authorizing Provider  Boswellia-Glucosamine-Vit D (SM GLUCOSAMINE-VITAMIN D3 PO) Take 2,000 Units by mouth daily. Patient not taking: Reported on 04/13/2023   Yes [provider]  LOVAZA 1 g capsule Take 2 g by mouth every other day.   Yes  [provider]  aspirin EC 81 MG tablet Take 1 tablet (81 mg total) by mouth 2 (two) times daily. To be taken after surgery to prevent blood clots 04/19/23 04/18/24  Cristie Hem, PA-C  atorvastatin (LIPITOR) 10 MG tablet Take 10 mg by mouth daily. Patient not taking: Reported on 04/09/2023    [provider]  clotrimazole-betamethasone (LOTRISONE) cream Apply 1 application topically 2 (two) times daily. Patient not taking: Reported on 04/09/2023 06/27/20   Rasch, Victorino Dike I, NP  docusate sodium (COLACE) 100 MG capsule Take 1 capsule (100 mg total) by mouth daily as needed. 04/19/23 04/18/24  Cristie Hem, PA-C  fluconazole (DIFLUCAN) 150 MG tablet Take 1 tablet (150 mg total) by mouth daily. Take one dose today and repeat in 3 days. Patient not taking: Reported on 04/09/2023 06/27/20   Rasch, Victorino Dike I, NP  methocarbamol (ROBAXIN-750) 750 MG tablet Take 1 tablet (750 mg total) by mouth 2 (two) times daily as needed for muscle spasms. 04/19/23   Cristie Hem, PA-C  omeprazole (PRILOSEC) 40 MG capsule Take 40 mg by mouth daily. Patient not taking: Reported on 04/09/2023    [provider]  ondansetron (ZOFRAN) 4 MG tablet Take 1 tablet (4 mg total) by mouth every 8 (eight) hours as needed for nausea or vomiting. 04/19/23  Cristie Hem, PA-C  oxyCODONE-acetaminophen (PERCOCET) 5-325 MG tablet Take 1-2 tablets by mouth every 6 (six) hours as needed. To be taken after surgery 04/19/23   Cristie Hem, PA-C     Positive ROS: All other systems have been reviewed and were otherwise negative with the exception of those mentioned in the HPI and as above.  Physical Exam: General: Alert, no acute distress Cardiovascular: No pedal edema Respiratory: No cyanosis, no use of accessory musculature GI: abdomen soft Skin: No lesions in the area of chief complaint Neurologic: Sensation intact distally Psychiatric: Patient is competent for consent with normal mood and  affect Lymphatic: no lymphedema  MUSCULOSKELETAL: exam stable  Assessment: left knee osteoarthritis  Plan: Plan for Procedure(s): LEFT TOTAL KNEE ARTHROPLASTY  The risks benefits and alternatives were discussed with the patient including but not limited to the risks of nonoperative treatment, versus surgical intervention including infection, bleeding, nerve injury,  blood clots, cardiopulmonary complications, morbidity, mortality, among others, and they were willing to proceed.   Glee Arvin, MD 04/23/2023 7:13 AM

## 2023-04-23 NOTE — Evaluation (Signed)
Physical Therapy Evaluation Patient Details Name: Cheryl Bush MRN: [id number] DOB: 12/27/1965 Today's Date: 04/23/2023  History of Present Illness  57 y.o. female presents to [facility] hospital on 04/23/2023 for elective L TKA. PMH includes HLD.  Clinical Impression  Pt presents to PT s/p L TKA, mobilizing well. Pt is able to ambulate with support of RW for household distances. PT provides education on TKA exercise packet, as well as need for knee extension when resting. Pt will benefit from further gait and stair training tomorrow.        Recommendations for follow up therapy are one component of a multi-disciplinary discharge planning process, led by the attending physician.  Recommendations may be updated based on patient status, additional functional criteria and insurance authorization.  Follow Up Recommendations       Assistance Recommended at Discharge PRN  Patient can return home with the following  A little help with bathing/dressing/bathroom;Assistance with cooking/housework;Help with stairs or ramp for entrance;Assist for transportation    Equipment Recommendations None recommended by PT  Recommendations for Other Services       Functional Status Assessment Patient has had a recent decline in their functional status and demonstrates the ability to make significant improvements in function in a reasonable and predictable amount of time.     Precautions / Restrictions Precautions Precautions: Knee;Fall Precaution Booklet Issued: Yes (comment) Restrictions Weight Bearing Restrictions: Yes LLE Weight Bearing: Weight bearing as tolerated      Mobility  Bed Mobility Overal bed mobility: Needs Assistance Bed Mobility: Supine to Sit, Sit to Supine     Supine to sit: Min guard Sit to supine: Min assist        Transfers Overall transfer level: Needs assistance Equipment used: Rolling walker (2 wheels) Transfers: Sit to/from Stand Sit to Stand: Min guard                 Ambulation/Gait Ambulation/Gait assistance: Min guard Gait Distance (Feet): 100 Feet Assistive device: Rolling walker (2 wheels) Gait Pattern/deviations: Step-through pattern Gait velocity: reduced Gait velocity interpretation: <1.8 ft/sec, indicate of risk for recurrent falls   General Gait Details: slowed step-through gait  Stairs            Wheelchair Mobility    Modified Rankin (Stroke Patients Only)       Balance Overall balance assessment: Needs assistance Sitting-balance support: No upper extremity supported, Feet supported Sitting balance-Leahy Scale: Good     Standing balance support: Bilateral upper extremity supported, Reliant on assistive device for balance Standing balance-Leahy Scale: Poor                               Pertinent Vitals/Pain Pain Assessment Pain Assessment: 0-10 Pain Score: 9  Pain Location: L knee Pain Descriptors / Indicators: Sore Pain Intervention(s): Monitored during session    Home Living Family/patient expects to be discharged to:: Private residence Living Arrangements: Spouse/significant other;Children Available Help at Discharge: Family;Available 24 hours/day Type of Home: House Home Access: Stairs to enter Entrance Stairs-Rails: None Entrance Stairs-Number of Steps: 2   Home Layout: One level Home Equipment: Agricultural consultant (2 wheels)      Prior Function Prior Level of Function : Independent/Modified Independent;Working/employed;Driving                     Hand Dominance        Extremity/Trunk Assessment   Upper Extremity Assessment Upper Extremity Assessment: Overall WFL for  tasks assessed    Lower Extremity Assessment Lower Extremity Assessment: LLE deficits/detail LLE Deficits / Details: post-op knee ROM deficits as expected s/p TKA. Pt able to perform SLR, still cites mild numbness LLE Sensation: decreased light touch    Cervical / Trunk Assessment Cervical / Trunk  Assessment: Normal  Communication   Communication: No difficulties  Cognition Arousal/Alertness: Awake/alert Behavior During Therapy: WFL for tasks assessed/performed Overall Cognitive Status: Within Functional Limits for tasks assessed                                          General Comments General comments (skin integrity, edema, etc.): VSS on RA    Exercises Other Exercises Other Exercises: PT provides education and demonstration for TKR exercise packet   Assessment/Plan    PT Assessment Patient needs continued PT services  PT Problem List Decreased strength;Decreased activity tolerance;Decreased range of motion;Decreased balance;Decreased mobility;Decreased knowledge of use of DME;Pain       PT Treatment Interventions DME instruction;Gait training;Stair training;Functional mobility training;Therapeutic activities;Therapeutic exercise;Balance training;Neuromuscular re-education;Patient/family education    PT Goals (Current goals can be found in the Care Plan section)  Acute Rehab PT Goals Patient Stated Goal: to go home PT Goal Formulation: With patient Time For Goal Achievement: 04/27/23 Potential to Achieve Goals: Good    Frequency 7X/week     Co-evaluation               AM-PAC PT "6 Clicks" Mobility  Outcome Measure Help needed turning from your back to your side while in a flat bed without using bedrails?: A Little Help needed moving from lying on your back to sitting on the side of a flat bed without using bedrails?: A Little Help needed moving to and from a bed to a chair (including a wheelchair)?: A Little Help needed standing up from a chair using your arms (e.g., wheelchair or bedside chair)?: A Little Help needed to walk in hospital room?: A Little Help needed climbing 3-5 steps with a railing? : Total 6 Click Score: 16    End of Session   Activity Tolerance: Patient tolerated treatment well Patient left: in bed;with call  bell/phone within reach Nurse Communication: Mobility status PT Visit Diagnosis: Other abnormalities of gait and mobility (R26.89);Muscle weakness (generalized) (M62.81)    Time: 1610-9604 PT Time Calculation (min) (ACUTE ONLY): 31 min   Charges:   PT Evaluation $PT Eval Low Complexity: 1 Low          Arlyss Gandy, PT, DPT Acute Rehabilitation Office [phone]   Arlyss Gandy 04/23/2023, 4:58 PM

## 2023-04-23 NOTE — Anesthesia Postprocedure Evaluation (Signed)
Anesthesia Post Note  Patient: Cheryl Bush  Procedure(s) Performed: LEFT TOTAL KNEE ARTHROPLASTY (Left: Knee)     Patient location during evaluation: PACU Anesthesia Type: Spinal Level of consciousness: sedated and patient cooperative Pain management: pain level controlled Vital Signs Assessment: post-procedure vital signs reviewed and stable Respiratory status: spontaneous breathing Cardiovascular status: stable Anesthetic complications: no   No notable events documented.  Last Vitals:  Vitals:   04/23/23 1230 04/23/23 1245  BP: 134/84 128/79  Pulse: 64 71  Resp: 13 15  Temp:    SpO2: 98% 93%    Last Pain:  Vitals:   04/23/23 1215  TempSrc:   PainSc: 0-No pain                 Lewie Loron

## 2023-04-23 NOTE — Anesthesia Preprocedure Evaluation (Addendum)
Anesthesia Evaluation  Patient identified by MRN, date of birth, ID band Patient awake    Reviewed: Allergy & Precautions, NPO status , Patient's Chart, lab work & pertinent test results  Airway Mallampati: III  TM Distance: >3 FB Neck ROM: Full    Dental  (+) Dental Advisory Given, Missing, Loose,    Pulmonary neg pulmonary ROS   Pulmonary exam normal breath sounds clear to auscultation       Cardiovascular negative cardio ROS Normal cardiovascular exam Rhythm:Regular Rate:Normal     Neuro/Psych negative neurological ROS     GI/Hepatic negative GI ROS, Neg liver ROS,,,  Endo/Other  negative endocrine ROS    Renal/GU negative Renal ROS     Musculoskeletal  (+) Arthritis ,    Abdominal   Peds  Hematology negative hematology ROS (+)   Anesthesia Other Findings   Reproductive/Obstetrics                             Anesthesia Physical Anesthesia Plan  ASA: 2  Anesthesia Plan: Spinal   Post-op Pain Management: Tylenol PO (pre-op)*, Gabapentin PO (pre-op)* and Regional block*   Induction: Intravenous  PONV Risk Score and Plan: 3 and Ondansetron, Dexamethasone, Propofol infusion, Treatment may vary due to age or medical condition, TIVA and Midazolam  Airway Management Planned: Natural Airway  Additional Equipment:   Intra-op Plan:   Post-operative Plan:   Informed Consent: I have reviewed the patients History and Physical, chart, labs and discussed the procedure including the risks, benefits and alternatives for the proposed anesthesia with the patient or authorized representative who has indicated his/her understanding and acceptance.     Dental advisory given  Plan Discussed with: CRNA  Anesthesia Plan Comments:         Anesthesia Quick Evaluation

## 2023-04-24 DIAGNOSIS — M1712 Unilateral primary osteoarthritis, left knee: Secondary | ICD-10-CM | POA: Diagnosis not present

## 2023-04-24 LAB — CBC
HCT: 37.8 % (ref 36.0–46.0)
Hemoglobin: 13 g/dL (ref 12.0–15.0)
MCH: 29.4 pg (ref 26.0–34.0)
MCHC: 34.4 g/dL (ref 30.0–36.0)
MCV: 85.5 fL (ref 80.0–100.0)
Platelets: 147 10*3/uL — ABNORMAL LOW (ref 150–400)
RBC: 4.42 MIL/uL (ref 3.87–5.11)
RDW: 12.4 % (ref 11.5–15.5)
WBC: 11.7 10*3/uL — ABNORMAL HIGH (ref 4.0–10.5)
nRBC: 0 % (ref 0.0–0.2)

## 2023-04-24 MED ORDER — ACETAMINOPHEN 500 MG PO TABS
1000.0000 mg | ORAL_TABLET | Freq: Four times a day (QID) | ORAL | Status: DC | PRN
  Administered 2023-04-24 (×2): 1000 mg via ORAL
  Filled 2023-04-24 (×2): qty 2

## 2023-04-24 NOTE — Progress Notes (Signed)
Arrived to 5N room 5N24 from [facility]. Alert, oriented, vitals obtained. Oriented to room and call bell.  Bed alarm on and call bell within reach. Bed in lowest position.

## 2023-04-24 NOTE — Progress Notes (Addendum)
3C staff provided [facility] to the patient and Family took equipment home. Report given to receiving RN on 5N. Patient remains alert/oriented in no acute distress.Dressing intact with no sign of infection noted. Iceman and room was checked and accounted for all her belongings, with patient.

## 2023-04-24 NOTE — Progress Notes (Signed)
   Subjective:  Patient reports pain as mild.  Main complaint is leg giving way during PT.  Objective:   VITALS:   Vitals:   04/23/23 1958 04/23/23 2358 04/24/23 0446 04/24/23 1307  BP: 134/80 (!) 96/52 118/72 (!) 142/68  Pulse: 71 (!) 57 82 74  Resp: 20 20 18 18   Temp: 97.9 F (36.6 C) (!) 97.5 F (36.4 C) (!) 97.5 F (36.4 C) 98.2 F (36.8 C)  TempSrc: Oral Oral Oral Oral  SpO2: 98% 96% 99% 99%  Weight:      Height:        Neurovascular intact Sensation intact distally Intact pulses distally Dorsiflexion/Plantar flexion intact Incision: dressing C/D/I and no drainage   Lab Results  Component Value Date   WBC 11.7 (H) 04/24/2023   HGB 13.0 04/24/2023   HCT 37.8 04/24/2023   MCV 85.5 04/24/2023   PLT 147 (L) 04/24/2023     Assessment/Plan:  1 Day Post-Op   - Expected postop acute blood loss anemia - will keep another day for more PT to focus on quad activation and gait - DVT ppx - SCDs, ambulation, aspirin - WBAT operative extremity - Pain control - anticipate d/c home tomorrow   Glee Arvin 04/24/2023, 1:41 PM

## 2023-04-24 NOTE — Evaluation (Signed)
Occupational Therapy Evaluation Patient Details Name: Cheryl Bush MRN: [id number] DOB: July 08, 1966 Today's Date: 04/24/2023   History of Present Illness 57 y.o. female presents to [facility] on 04/23/2023 for elective L TKA. PMH includes HLD.   Clinical Impression   Pt reports independence with ADLs/mobility at baseline PTA, pt lives with family who can assist at d/c. Pt currently mod I - supervision for ADLs, supervision for bed mobility and min guard for transfers with RW. Educated pt on compensatory strategies for ADLs and reviewed use of BSC/3in1 as shower seat for tub/shower at home. Pt verbalized understanding, also discussed keeping knee straight (no pillows under knee). Pt presenting with impairments listed below, will follow acutely to maximize safety/ind with ADLs/functional mobility.      Recommendations for follow up therapy are one component of a multi-disciplinary discharge planning process, led by the attending physician.  Recommendations may be updated based on patient status, additional functional criteria and insurance authorization.   Assistance Recommended at Discharge Set up Supervision/Assistance  Patient can return home with the following Assistance with cooking/housework;Assist for transportation;Help with stairs or ramp for entrance;A little help with bathing/dressing/bathroom;A little help with walking and/or transfers    Functional Status Assessment  Patient has had a recent decline in their functional status and demonstrates the ability to make significant improvements in function in a reasonable and predictable amount of time.  Equipment Recommendations  BSC/3in1    Recommendations for Other Services PT consult     Precautions / Restrictions Precautions Precautions: Knee;Fall Restrictions Weight Bearing Restrictions: Yes (Simultaneous filing. User may not have seen previous data.) LLE Weight Bearing: Weight bearing as tolerated (Simultaneous filing. User  may not have seen previous data.)      Mobility Bed Mobility Overal bed mobility: Needs Assistance Bed Mobility: Supine to Sit, Sit to Supine     Supine to sit: Supervision Sit to supine: Supervision        Transfers Overall transfer level: Needs assistance Equipment used: Rolling walker (2 wheels) Transfers: Sit to/from Stand Sit to Stand: Min guard                  Balance Overall balance assessment: Needs assistance Sitting-balance support: No upper extremity supported, Feet supported Sitting balance-Leahy Scale: Good     Standing balance support: Bilateral upper extremity supported, Reliant on assistive device for balance Standing balance-Leahy Scale: Fair Standing balance comment: stands statically without UE support                           ADL either performed or assessed with clinical judgement   ADL Overall ADL's : Needs assistance/impaired Eating/Feeding: Modified independent   Grooming: Modified independent;Wash/dry hands;Standing   Upper Body Bathing: Min guard;Sitting   Lower Body Bathing: Min guard;Sitting/lateral leans   Upper Body Dressing : Min guard;Sitting   Lower Body Dressing: Min guard;Sit to/from stand   Toilet Transfer: Min guard;Ambulation;Comfort height toilet;Rolling walker (2 wheels)   Toileting- Clothing Manipulation and Hygiene: Supervision/safety               Vision   Vision Assessment?: No apparent visual deficits     Perception Perception Perception Tested?: No   Praxis Praxis Praxis tested?: Not tested    Pertinent Vitals/Pain Pain Assessment Pain Assessment: Faces Pain Score: 5  Faces Pain Scale: Hurts little more Pain Location: L knee Pain Descriptors / Indicators: Sore, Discomfort Pain Intervention(s): Limited activity within patient's tolerance, Monitored during session, Repositioned  Hand Dominance     Extremity/Trunk Assessment Upper Extremity Assessment Upper Extremity  Assessment: Overall WFL for tasks assessed   Lower Extremity Assessment Lower Extremity Assessment: Defer to PT evaluation   Cervical / Trunk Assessment Cervical / Trunk Assessment: Normal   Communication Communication Communication: No difficulties   Cognition Arousal/Alertness: Awake/alert Behavior During Therapy: WFL for tasks assessed/performed Overall Cognitive Status: Within Functional Limits for tasks assessed                                       General Comments  VSS on RA    Exercises     Shoulder Instructions      Home Living Family/patient expects to be discharged to:: Private residence Living Arrangements: Spouse/significant other;Children Available Help at Discharge: Family;Available 24 hours/day Type of Home: House Home Access: Stairs to enter [employer] of Steps: 2 Entrance Stairs-Rails: None Home Layout: One level     Bathroom Shower/Tub: Chief Strategy Officer: Standard     Home Equipment: Agricultural consultant (2 wheels)          Prior Functioning/Environment Prior Level of Function : Independent/Modified Independent;Working/employed;Driving                        OT Problem List: Decreased range of motion;Decreased activity tolerance;Impaired balance (sitting and/or standing)      OT Treatment/Interventions: Self-care/ADL training;Therapeutic exercise;DME and/or AE instruction;Therapeutic activities;Balance training;Patient/family education    OT Goals(Current goals can be found in the care plan section) Acute Rehab OT Goals Patient Stated Goal: none stated OT Goal Formulation: With patient Time For Goal Achievement: 05/08/23 Potential to Achieve Goals: Good  OT Frequency: Min 1X/week    Co-evaluation              AM-PAC OT "6 Clicks" Daily Activity     Outcome Measure Help from another person eating meals?: None Help from another person taking care of personal grooming?: None Help  from another person toileting, which includes using toliet, bedpan, or urinal?: None Help from another person bathing (including washing, rinsing, drying)?: A Little Help from another person to put on and taking off regular upper body clothing?: None Help from another person to put on and taking off regular lower body clothing?: A Little 6 Click Score: 22   End of Session Equipment Utilized During Treatment: Rolling walker (2 wheels) Nurse Communication: Mobility status  Activity Tolerance: Patient tolerated treatment well Patient left: in bed;with call bell/phone within reach  OT Visit Diagnosis: Unsteadiness on feet (R26.81);Muscle weakness (generalized) (M62.81)                Time: 4132-4401 OT Time Calculation (min): 24 min Charges:  OT General Charges $OT Visit: 1 Visit OT Evaluation $OT Eval Low Complexity: 1 Low OT Treatments $Self Care/Home Management : 8-22 mins  Carver Fila, OTD, OTR/L SecureChat Preferred Acute Rehab [phone]   Carver Fila Koonce 04/24/2023, 9:24 AM

## 2023-04-24 NOTE — Progress Notes (Signed)
Physical Therapy Treatment Patient Details Name: Cheryl Bush MRN: [id number] DOB: [birth year] Today's Date: 04/24/2023   History of Present Illness 57 y.o. female presents to [facility] on 04/23/2023 for elective L TKA. PMH includes HLD.    PT Comments    Pt tolerates treatment well, ambulating for increased distances. Pt benefits from hand hold and assistance to negotiate stairs as she does not have railings to depend on for support. PT provides reinforcement of HEP. Pt is encouraged to mobilize frequently in an effort to improve strength and to restore independence.   Recommendations for follow up therapy are one component of a multi-disciplinary discharge planning process, led by the attending physician.  Recommendations may be updated based on patient status, additional functional criteria and insurance authorization.  Follow Up Recommendations       Assistance Recommended at Discharge PRN  Patient can return home with the following A little help with bathing/dressing/bathroom;Assistance with cooking/housework;Help with stairs or ramp for entrance;Assist for transportation   Equipment Recommendations  None recommended by PT    Recommendations for Other Services       Precautions / Restrictions Precautions Precautions: Knee;Fall Precaution Booklet Issued: Yes (comment) Restrictions Weight Bearing Restrictions: Yes LLE Weight Bearing: Weight bearing as tolerated     Mobility  Bed Mobility Overal bed mobility: Modified Independent Bed Mobility: Supine to Sit, Sit to Supine     Supine to sit: Modified independent (Device/Increase time) Sit to supine: Modified independent (Device/Increase time)        Transfers Overall transfer level: Needs assistance Equipment used: Rolling walker (2 wheels) Transfers: Sit to/from Stand Sit to Stand: Supervision                Ambulation/Gait Ambulation/Gait assistance: Supervision Gait Distance (Feet): 150  Feet Assistive device: Rolling walker (2 wheels) Gait Pattern/deviations: Step-through pattern Gait velocity: reduced Gait velocity interpretation: <1.8 ft/sec, indicate of risk for recurrent falls   General Gait Details: slowed step-through gait   Stairs Stairs: Yes Stairs assistance: Min assist Stair Management: Step to pattern, Forwards, With walker (walker in one hand, hand hold in opposite) Number of Stairs: 3 General stair comments: 2 trials, 2 steps initial trial   Wheelchair Mobility    Modified Rankin (Stroke Patients Only)       Balance Overall balance assessment: Needs assistance Sitting-balance support: No upper extremity supported, Feet supported Sitting balance-Leahy Scale: Good     Standing balance support: Single extremity supported, Reliant on assistive device for balance Standing balance-Leahy Scale: Poor                              Cognition Arousal/Alertness: Awake/alert Behavior During Therapy: WFL for tasks assessed/performed Overall Cognitive Status: Within Functional Limits for tasks assessed                                          Exercises Other Exercises Other Exercises: PT reinforces education on TKR HEP    General Comments General comments (skin integrity, edema, etc.): VSS on RA      Pertinent Vitals/Pain Pain Assessment Pain Assessment: Faces Faces Pain Scale: Hurts little more Pain Location: L knee Pain Descriptors / Indicators: Sore Pain Intervention(s): Monitored during session    Home Living Family/patient expects to be discharged to:: Private residence Living Arrangements: Spouse/significant other;Children Available Help at Discharge: Family;Available 24  hours/day Type of Home: House Home Access: Stairs to enter Entrance Stairs-Rails: None Entrance Stairs-Number of Steps: 2   Home Layout: One level Home Equipment: Agricultural consultant (2 wheels)      Prior Function            PT  Goals (current goals can now be found in the care plan section) Acute Rehab PT Goals Patient Stated Goal: to go home Progress towards PT goals: Progressing toward goals    Frequency    7X/week      PT Plan Current plan remains appropriate    Co-evaluation              AM-PAC PT "6 Clicks" Mobility   Outcome Measure  Help needed turning from your back to your side while in a flat bed without using bedrails?: None Help needed moving from lying on your back to sitting on the side of a flat bed without using bedrails?: None Help needed moving to and from a bed to a chair (including a wheelchair)?: A Little Help needed standing up from a chair using your arms (e.g., wheelchair or bedside chair)?: A Little Help needed to walk in hospital room?: A Little Help needed climbing 3-5 steps with a railing? : A Little 6 Click Score: 20    End of Session   Activity Tolerance: Patient tolerated treatment well Patient left: in bed;with call bell/phone within reach Nurse Communication: Mobility status PT Visit Diagnosis: Other abnormalities of gait and mobility (R26.89);Muscle weakness (generalized) (M62.81)     Time: 9147-8295 PT Time Calculation (min) (ACUTE ONLY): 25 min  Charges:  $Gait Training: 8-22 mins $Therapeutic Exercise: 8-22 mins                     Arlyss Gandy, PT, DPT Acute Rehabilitation Office [phone]    Arlyss Gandy 04/24/2023, 10:03 AM

## 2023-04-25 DIAGNOSIS — M1712 Unilateral primary osteoarthritis, left knee: Secondary | ICD-10-CM | POA: Diagnosis not present

## 2023-04-25 NOTE — TOC Transition Note (Signed)
Transition of Care [facility]) - CM/SW Discharge Note   Patient Details  Name: Cheryl Bush MRN: [id number] Date of Birth: [birth year]  Transition of Care [facility]) CM/SW Contact:  Ronny Bacon, RN Phone Number: 04/25/2023, 7:25 AM   Clinical Narrative:  Patient to be discharged home today. Spoke with patient by phone, confirmed that she has BSC, RW at bedside, and a ride home at discharge. [facility] aware patient being discharged to day and confirmed she has patient for PT and OT.      Final next level of care: Home w Home Health Services Barriers to Discharge: No Barriers Identified   Patient Goals and CMS Choice      Discharge Placement                         Discharge Plan and Services Additional resources added to the After Visit Summary for                                       Social Determinants of Health (SDOH) Interventions SDOH Screenings   Food Insecurity: No Food Insecurity (02/19/2023)  Housing: Low Risk  (02/19/2023)  Transportation Needs: No Transportation Needs (02/19/2023)  Utilities: Not At Risk (02/19/2023)  Tobacco Use: Low Risk  (04/23/2023)     Readmission Risk Interventions     No data to display

## 2023-04-25 NOTE — Progress Notes (Signed)
Physical Therapy Treatment Patient Details Name: Cheryl Bush MRN: [id number] DOB: [birth year] Today's Date: 04/25/2023   History of Present Illness 57 y.o. female presents to [facility] hospital on 04/23/2023 for elective L TKA. PMH includes HLD.    PT Comments    Continuing work on functional mobility and activity tolerance;  Session focused on therapeutic exercises and gait and stair training in prep for dc home; Overall moving well, and progressed to being able to prerform a straight leg raise without assist with very little quad lag; Questions solicited and answered; OK for dc home from PT standpoint     Recommendations for follow up therapy are one component of a multi-disciplinary discharge planning process, led by the attending physician.  Recommendations may be updated based on patient status, additional functional criteria and insurance authorization.  Follow Up Recommendations       Assistance Recommended at Discharge PRN  Patient can return home with the following A little help with bathing/dressing/bathroom;Assistance with cooking/housework;Help with stairs or ramp for entrance;Assist for transportation   Equipment Recommendations  None recommended by PT    Recommendations for Other Services       Precautions / Restrictions Precautions Precautions: Knee;Fall Precaution Booklet Issued: Yes (comment) Precaution Comments: Fall risk is present, but minimized with use of rW Restrictions Weight Bearing Restrictions: Yes LLE Weight Bearing: Weight bearing as tolerated     Mobility  Bed Mobility Overal bed mobility: Needs Assistance Bed Mobility: Supine to Sit     Supine to sit: Supervision     General bed mobility comments: Cues for techqniue and encouragement to perfrom task without UEs assisting LLE    Transfers Overall transfer level: Needs assistance Equipment used: Rolling walker (2 wheels) Transfers: Sit to/from Stand Sit to Stand: Supervision            General transfer comment: cues for hand placement    Ambulation/Gait Ambulation/Gait assistance: Supervision Gait Distance (Feet): 220 Feet Assistive device: Rolling walker (2 wheels) Gait Pattern/deviations: Step-through pattern Gait velocity: reduced     General Gait Details: slowed step-through gait; difficulty walking and talking at the same time; Nice, stable knee in stance   Stairs Stairs: Yes Stairs assistance: Min assist Stair Management: Forwards, Step to pattern (one trial with single person assist handheld, second trial with 2 person assist, PT on one side and COTA on the other side) Number of Stairs: 3 (x2) General stair comments: Opted to practice with 2 person assist, one on each side to approximate husband and son assisting pt   Wheelchair Mobility    Modified Rankin (Stroke Patients Only)       Balance     Sitting balance-Leahy Scale: Good       Standing balance-Leahy Scale: Poor                              Cognition Arousal/Alertness: Awake/alert Behavior During Therapy: WFL for tasks assessed/performed Overall Cognitive Status: Within Functional Limits for tasks assessed                                          Exercises Total Joint Exercises Quad Sets: AROM, Left, 10 reps Short Arc Quad: AAROM, AROM, Left, 10 reps Heel Slides: AAROM, Left, 10 reps Straight Leg Raises: AAROM, AROM, Left, 10 reps Knee Flexion: AROM, AAROM, Left, 5 reps, Seated (with  RLE providing over-pressure)    General Comments General comments (skin integrity, edema, etc.): Extra time to answer questions about therex      Pertinent Vitals/Pain Pain Assessment Pain Assessment: Faces Faces Pain Scale: Hurts whole lot Pain Location: L knee, especially at end range flexion Pain Descriptors / Indicators: Sore Pain Intervention(s): Monitored during session    Home Living                          Prior Function             PT Goals (current goals can now be found in the care plan section) Acute Rehab PT Goals Patient Stated Goal: to go home PT Goal Formulation: With patient Time For Goal Achievement: 04/27/23 Potential to Achieve Goals: Good Progress towards PT goals: Progressing toward goals    Frequency    7X/week      PT Plan Current plan remains appropriate    Co-evaluation              AM-PAC PT "6 Clicks" Mobility   Outcome Measure  Help needed turning from your back to your side while in a flat bed without using bedrails?: None Help needed moving from lying on your back to sitting on the side of a flat bed without using bedrails?: None Help needed moving to and from a bed to a chair (including a wheelchair)?: A Little Help needed standing up from a chair using your arms (e.g., wheelchair or bedside chair)?: None Help needed to walk in hospital room?: A Little Help needed climbing 3-5 steps with a railing? : A Little 6 Click Score: 21    End of Session   Activity Tolerance: Patient tolerated treatment well Patient left: Other (comment) (switching to work on ADLs with COTA) Nurse Communication: Mobility status PT Visit Diagnosis: Other abnormalities of gait and mobility (R26.89);Muscle weakness (generalized) (M62.81)     Time: 4098-1191 PT Time Calculation (min) (ACUTE ONLY): 32 min  Charges:  $Gait Training: 8-22 mins $Therapeutic Exercise: 8-22 mins                     Van Clines, PT  Acute Rehabilitation Services Office [phone] Secure Chat welcomed    Cheryl Bush 04/25/2023, 11:35 AM

## 2023-04-25 NOTE — Plan of Care (Signed)

## 2023-04-25 NOTE — Progress Notes (Signed)
   Subjective:  Patient reports pain as mild. Tolerating diet. Voiding. Continues to work with PT  Objective:   VITALS:   Vitals:   04/24/23 1758 04/24/23 2016 04/25/23 0054 04/25/23 0540  BP:  124/62 120/62 129/78  Pulse:  81 76 73  Resp:  16 14 14   Temp: 98.1 F (36.7 C) 98.2 F (36.8 C) 98.2 F (36.8 C) 98.1 F (36.7 C)  TempSrc: Oral Oral Oral Oral  SpO2:  98% 100% 99%  Weight:      Height:        Neurovascular intact Sensation intact distally Intact pulses distally Dorsiflexion/Plantar flexion intact Incision: dressing C/D/I and no drainage   Lab Results  Component Value Date   WBC 11.7 (H) 04/24/2023   HGB 13.0 04/24/2023   HCT 37.8 04/24/2023   MCV 85.5 04/24/2023   PLT 147 (L) 04/24/2023     Assessment/Plan:  2 Days Post-Op   - Expected postop acute blood loss anemia - will keep another day for more PT to focus on quad activation and gait - DVT ppx - SCDs, ambulation, aspirin - WBAT operative extremity - Pain control - anticipate d/c home pending PT    Dorleen Kissel 04/25/2023, 6:49 AM

## 2023-04-25 NOTE — Progress Notes (Signed)
Occupational Therapy Treatment Patient Details Name: Cheryl Bush MRN: [id number] DOB: 13-Jul-1966 Today's Date: 04/25/2023   History of present illness 57 y.o. female presents to [facility] hospital on 04/23/2023 for elective L TKA. PMH includes HLD.   OT comments  Pt. Seen for skilled OT treatment session.  Focus on tub transfer with shower chair no arm rests. Pt. Able to navigate transfer with min a to manage LLE in/out of tub for R faucet.  Will have support/assistance from husband and son at home.     Recommendations for follow up therapy are one component of a multi-disciplinary discharge planning process, led by the attending physician.  Recommendations may be updated based on patient status, additional functional criteria and insurance authorization.    Assistance Recommended at Discharge    Patient can return home with the following  Assistance with cooking/housework;Assist for transportation;Help with stairs or ramp for entrance;A little help with bathing/dressing/bathroom;A little help with walking and/or transfers   Equipment Recommendations  BSC/3in1    Recommendations for Other Services      Precautions / Restrictions Precautions Precautions: Knee;Fall Precaution Booklet Issued: Yes (comment) Precaution Comments: Fall risk is present, but minimized with use of rW Restrictions LLE Weight Bearing: Weight bearing as tolerated       Mobility Bed Mobility                    Transfers                         Balance                                           ADL either performed or assessed with clinical judgement   ADL Overall ADL's : Needs assistance/impaired                                 Tub/ Shower Transfer: Tub transfer;Minimal assistance;Shower Field seismologist Details (indicate cue type and reason): pt. able to sit on edge of shower chair and turn and guide LLE into tub, requires asssitance to guide LLE  out of tub.  could not remember if shower chair at home had arm rests or not but today we utilized without and was easier as she can not step over tub yet.  reviewed transfer plans if seat does have arm rests or did not fit in base of tub which was another concern of hers.  Discussed possible ways to navigate the transfer by turning the chair side ways in the tub and appoaching backwards with legs out possibly.  Pt. Continued to point to shower chair without arm rests and thinks that is what is available at home.   Functional mobility during ADLs: Min guard      Extremity/Trunk Assessment              Vision       Perception     Praxis      Cognition Arousal/Alertness: Awake/alert Behavior During Therapy: WFL for tasks assessed/performed Overall Cognitive Status: Within Functional Limits for tasks assessed  Exercises      Shoulder Instructions       General Comments Extra time to answer questions about therex    Pertinent Vitals/ Pain       Pain Assessment Pain Assessment: No/denies pain  Home Living                                          Prior Functioning/Environment              Frequency  Min 1X/week        Progress Toward Goals  OT Goals(current goals can now be found in the care plan section)  Progress towards OT goals: Progressing toward goals     Plan Discharge plan remains appropriate    Co-evaluation                 AM-PAC OT "6 Clicks" Daily Activity     Outcome Measure   Help from another person eating meals?: None Help from another person taking care of personal grooming?: None Help from another person toileting, which includes using toliet, bedpan, or urinal?: None Help from another person bathing (including washing, rinsing, drying)?: A Little Help from another person to put on and taking off regular upper body clothing?: None Help from another  person to put on and taking off regular lower body clothing?: A Little 6 Click Score: 22    End of Session Equipment Utilized During Treatment: Rolling walker (2 wheels)  OT Visit Diagnosis: Unsteadiness on feet (R26.81);Muscle weakness (generalized) (M62.81)   Activity Tolerance Patient tolerated treatment well   Patient Left in chair;with call bell/phone within reach;with family/visitor present   Nurse Communication Other (comment) (alerted rn pt. and spouse have questions from d/c packet regarding management of bandage on knee)        Time: 1020-1049 OT Time Calculation (min): 29 min  Charges: OT General Charges $OT Visit: 1 Visit OT Treatments $Self Care/Home Management : 23-37 mins  [name], COTA/L Acute Rehabilitation [phone]   Alessandra Bevels Lorraine-COTA/L 04/25/2023, 12:53 PM

## 2023-04-25 NOTE — Discharge Summary (Signed)
Patient ID: Cheryl Bush MRN: [id number] DOB/AGE: 1965-10-31 57 y.o.  Admit date: 04/23/2023 Discharge date: 04/25/2023  Admission Diagnoses:  Primary osteoarthritis of left knee  Discharge Diagnoses:  Principal Problem:   Primary osteoarthritis of left knee Active Problems:   Status post total left knee replacement   Past Medical History:  Diagnosis Date   High cholesterol    Inverted nipple left   abcess removed from areola, causing some scarring and inverted nipple    Surgeries: Procedure(s): LEFT TOTAL KNEE ARTHROPLASTY on 04/23/2023   Consultants (if any):   Discharged Condition: Improved  Hospital Course: Cheryl Bush is an 57 y.o. female who was admitted 04/23/2023 with a diagnosis of Primary osteoarthritis of left knee and went to the operating room on 04/23/2023 and underwent the above named procedures.    She was given perioperative antibiotics:  Anti-infectives (From admission, onward)    Start     Dose/Rate Route Frequency Ordered Stop   04/23/23 1600  ceFAZolin (ANCEF) IVPB 2g/100 mL premix        2 g 200 mL/hr over 30 Minutes Intravenous Every 6 hours 04/23/23 1223 04/24/23 1222   04/23/23 1023  vancomycin (VANCOCIN) powder  Status:  Discontinued          As needed 04/23/23 1023 04/23/23 1140   04/23/23 0700  ceFAZolin (ANCEF) IVPB 2g/100 mL premix        2 g 200 mL/hr over 30 Minutes Intravenous On call to O.R. 04/23/23 4098 04/23/23 1016     .  She was given sequential compression devices, early ambulation, and appropriate chemoprophylaxis for DVT prophylaxis.  She benefited maximally from the hospital stay and there were no complications.    Recent vital signs:  Vitals:   04/25/23 0540 04/25/23 0729  BP: 129/78 138/81  Pulse: 73 86  Resp: 14 17  Temp: 98.1 F (36.7 C) 97.9 F (36.6 C)  SpO2: 99% 100%    Recent laboratory studies:  Lab Results  Component Value Date   HGB 13.0 04/24/2023   HGB 14.5 04/13/2023   HGB 15.2 (H)  04/09/2023   Lab Results  Component Value Date   WBC 11.7 (H) 04/24/2023   PLT 147 (L) 04/24/2023   No results found for: "INR" Lab Results  Component Value Date   NA 138 04/13/2023   K 4.0 04/13/2023   CL 105 04/13/2023   CO2 27 04/13/2023   BUN 16 04/13/2023   CREATININE 0.54 04/13/2023   GLUCOSE 112 (H) 04/13/2023    Discharge Medications:   Allergies as of 04/25/2023   No Known Allergies      Medication List     STOP taking these medications    atorvastatin 10 MG tablet Commonly known as: LIPITOR   clotrimazole-betamethasone cream Commonly known as: Lotrisone   fluconazole 150 MG tablet Commonly known as: Diflucan   omeprazole 40 MG capsule Commonly known as: PRILOSEC   SM GLUCOSAMINE-VITAMIN D3 PO       TAKE these medications    aspirin EC 81 MG tablet Take 1 tablet (81 mg total) by mouth 2 (two) times daily. To be taken after surgery to prevent blood clots   docusate sodium 100 MG capsule Commonly known as: Colace Take 1 capsule (100 mg total) by mouth daily as needed.   Lovaza 1 g capsule Generic drug: omega-3 acid ethyl esters Take 2 g by mouth every other day.   methocarbamol 750 MG tablet Commonly known as: Robaxin-750 Take 1 tablet (  750 mg total) by mouth 2 (two) times daily as needed for muscle spasms.   ondansetron 4 MG tablet Commonly known as: Zofran Take 1 tablet (4 mg total) by mouth every 8 (eight) hours as needed for nausea or vomiting.   oxyCODONE-acetaminophen 5-325 MG tablet Commonly known as: Percocet Take 1-2 tablets by mouth every 6 (six) hours as needed. To be taken after surgery               Durable Medical Equipment  (From admission, onward)           Start     Ordered   04/23/23 1448  DME Walker rolling  Once       Question Answer Comment  Walker: With 5 Inch Wheels   Patient needs a walker to treat with the following condition Status post left partial knee replacement      04/23/23 1447    04/23/23 1448  DME 3 n 1  Once        04/23/23 1447   04/23/23 1448  DME Bedside commode  Once       Question:  Patient needs a bedside commode to treat with the following condition  Answer:  Status post left partial knee replacement   04/23/23 1447            Diagnostic Studies: DG Knee Left Port  Result Date: 04/23/2023 CLINICAL DATA:  Postoperative left knee. EXAM: PORTABLE LEFT KNEE - 1-2 VIEW COMPARISON:  Left knee radiographs 03/09/2023 FINDINGS: Interval total left knee arthroplasty. No perihardware lucency is seen to indicate hardware failure or loosening. Expected postoperative changes including moderate joint effusion, intra-articular air, and anterior subcutaneous air. No acute fracture or dislocation. IMPRESSION: Interval total left knee arthroplasty without evidence of hardware failure. Electronically Signed   By: Neita Garnet M.D.   On: 04/23/2023 12:47    Disposition: Discharge disposition: 01-Home or Self Care          Follow-up Information     Cristie Hem, PA-C. Schedule an appointment as soon as possible for a visit in 2 week(s).   Specialty: Orthopedic Surgery Contact information: [address] [place] [address] [phone]         [facility] Follow up.   Why: [facility]--the home health agency will contact you for the first home visit. Contact information: [address] [place] [address] [phone]                  Signed: Huel Cote 04/25/2023, 11:49 AM

## 2023-04-27 ENCOUNTER — Encounter: Payer: PRIVATE HEALTH INSURANCE | Admitting: Physician Assistant

## 2023-04-30 ENCOUNTER — Telehealth: Payer: Self-pay | Admitting: Orthopaedic Surgery

## 2023-04-30 ENCOUNTER — Other Ambulatory Visit: Payer: Self-pay

## 2023-04-30 DIAGNOSIS — M1712 Unilateral primary osteoarthritis, left knee: Secondary | ICD-10-CM

## 2023-04-30 DIAGNOSIS — Z96652 Presence of left artificial knee joint: Secondary | ICD-10-CM

## 2023-04-30 NOTE — Telephone Encounter (Signed)
Sounds like normal post-surgical changes but we can order u/s to r/o DVT.

## 2023-04-30 NOTE — Telephone Encounter (Signed)
Appt scheduled at 11am on Monday July 8 at [facility]. Pt aware of appt

## 2023-04-30 NOTE — Telephone Encounter (Signed)
Pt called stating that In patient physical therapy called and stated she is out of network with her insurance. Please call pt about this matter at [phone].

## 2023-04-30 NOTE — Telephone Encounter (Signed)
Patient had a total knee replacement on 04/23/23. Called in today with concerns of bruising up her thigh and some swelling. Says area is warm to the touch. No redness or tenderness. Let her know that I would sent urgent message and see if we need to send for u/s to r/u Dvt.

## 2023-05-03 ENCOUNTER — Telehealth: Payer: Self-pay | Admitting: Orthopaedic Surgery

## 2023-05-03 ENCOUNTER — Telehealth: Payer: Self-pay

## 2023-05-03 ENCOUNTER — Ambulatory Visit (HOSPITAL_COMMUNITY)
Admission: RE | Admit: 2023-05-03 | Discharge: 2023-05-03 | Disposition: A | Discharge status: Home / Self Care | Payer: PRIVATE HEALTH INSURANCE | Source: Ambulatory Visit | Attending: Orthopaedic Surgery | Admitting: Orthopaedic Surgery

## 2023-05-03 DIAGNOSIS — Z96652 Presence of left artificial knee joint: Secondary | ICD-10-CM | POA: Diagnosis present

## 2023-05-03 NOTE — Progress Notes (Signed)
VASCULAR LAB    Left lower extremity venous duplex has been performed.  See CV proc for preliminary results.  Called report to April  Chaysen Tillman, [facility], RVT 05/03/2023, 1:42 PM

## 2023-05-03 NOTE — Telephone Encounter (Signed)
Per Candice with [facility] wanted to ler Dr. Roda Shutters know that patient is Negative for DVT, left LE, but has hematoma in her distal thigh.  Patient would like a call back.

## 2023-05-03 NOTE — Telephone Encounter (Signed)
Pt said [facility] care for post op called and said she is not in network and can we send in referral for [facility] instead please advise pt would like to know how long she needs to keep her compression socks on

## 2023-05-04 ENCOUNTER — Other Ambulatory Visit: Payer: Self-pay

## 2023-05-04 ENCOUNTER — Other Ambulatory Visit: Payer: Self-pay | Admitting: Physician Assistant

## 2023-05-04 ENCOUNTER — Telehealth: Payer: Self-pay

## 2023-05-04 DIAGNOSIS — Z96652 Presence of left artificial knee joint: Secondary | ICD-10-CM

## 2023-05-04 MED ORDER — METHOCARBAMOL 750 MG PO TABS: 750.0000 mg | ORAL_TABLET | Freq: Two times a day (BID) | ORAL | 2 refills | Status: DC | PRN

## 2023-05-04 NOTE — Telephone Encounter (Signed)
Called patient. She is out of network with [employer] and [place]. Patric Dykes, but they will not staff it. I have let patient know, and ordered outpatient PT for patient.

## 2023-05-04 NOTE — Telephone Encounter (Signed)
sent 

## 2023-05-05 ENCOUNTER — Ambulatory Visit: Payer: PRIVATE HEALTH INSURANCE | Attending: Orthopaedic Surgery | Admitting: Physical Therapy

## 2023-05-05 ENCOUNTER — Encounter: Payer: Self-pay | Admitting: Physical Therapy

## 2023-05-05 ENCOUNTER — Other Ambulatory Visit: Payer: Self-pay

## 2023-05-05 DIAGNOSIS — M25562 Pain in left knee: Secondary | ICD-10-CM | POA: Diagnosis present

## 2023-05-05 DIAGNOSIS — M25662 Stiffness of left knee, not elsewhere classified: Secondary | ICD-10-CM | POA: Insufficient documentation

## 2023-05-05 DIAGNOSIS — R2689 Other abnormalities of gait and mobility: Secondary | ICD-10-CM | POA: Diagnosis present

## 2023-05-05 DIAGNOSIS — M6281 Muscle weakness (generalized): Secondary | ICD-10-CM | POA: Insufficient documentation

## 2023-05-05 DIAGNOSIS — Z96652 Presence of left artificial knee joint: Secondary | ICD-10-CM | POA: Diagnosis not present

## 2023-05-05 DIAGNOSIS — R600 Localized edema: Secondary | ICD-10-CM | POA: Insufficient documentation

## 2023-05-05 NOTE — Therapy (Signed)
OUTPATIENT PHYSICAL THERAPY LOWER EXTREMITY EVALUATION   Patient Name: Marieme Mcmackin MRN: [id number] DOB:[birth year], 57 y.o., female Today's Date: 05/05/2023  END OF SESSION:  PT End of Session - 05/05/23 1135     Visit Number 1    Number of Visits 17    Date for PT Re-Evaluation 06/30/23    Authorization Type [employer]    Authorization Time Period no auth til visit 40 per appt notes    PT Start Time 1140    PT Stop Time 1230    PT Time Calculation (min) 50 min    Activity Tolerance Patient tolerated treatment well;Patient limited by pain    Behavior During Therapy WFL for tasks assessed/performed             Past Medical History:  Diagnosis Date   High cholesterol    Inverted nipple left   abcess removed from areola, causing some scarring and inverted nipple   Past Surgical History:  Procedure Laterality Date   KNEE SURGERY     left- arthroscopy   TOTAL KNEE ARTHROPLASTY Left 04/23/2023   Procedure: LEFT TOTAL KNEE ARTHROPLASTY;  Surgeon: Tarry Kos, MD;  Location: [facility] OR;  Service: Orthopedics;  Laterality: Left;   Patient Active Problem List   Diagnosis Date Noted   Status post total left knee replacement 04/23/2023   Primary osteoarthritis of right knee 07/28/2022   Primary osteoarthritis of left knee 07/28/2022   Leukopenia 09/07/2016   Mastitis-left, medial aspect 03/15/2013    PCP: Norm Salt, PA  REFERRING PROVIDER: Tarry Kos, MD  REFERRING DIAG: [phone] (ICD-10-CM) - S/P total knee arthroplasty, left  THERAPY DIAG:  Left knee pain, unspecified chronicity  Muscle weakness (generalized)  Stiffness of left knee, not elsewhere classified  Localized edema  Other abnormalities of gait and mobility  Rationale for Evaluation and Treatment: Rehabilitation  ONSET DATE: 04/23/23 L TKA   SUBJECTIVE:   SUBJECTIVE STATEMENT: Accompanied by husband per her request.  Pt states she has had a lot of pain since surgery, hasn't been able to  have home therapy due to insurance issues. No AD use before surgery, using RW since. No N/T, still having swelling and bruising. Difficulty sleeping due to positioning. Reports fairly consistently high pain levels.   PERTINENT HISTORY: L TKA 04/23/23 PAIN:  Are you having pain: 9/10 Location/description: L knee pain  - aggravating factors: standing/walking, typical household activities, difficulty sleeping  - Easing factors: icing, rest    PRECAUTIONS: None  WEIGHT BEARING RESTRICTIONS: No  FALLS:  Has patient fallen in last 6 months? No  LIVING ENVIRONMENT: 4 steps to enter home no rail, 1 level. Lives with her husband and her son (65 y.o). Pt typically does majority of housework.   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 Has to do lifting, variable weights  PLOF: Independent  PATIENT GOALS: get back to normal   NEXT MD VISIT: Friday (July 12)  OBJECTIVE:   DIAGNOSTIC FINDINGS:  S/p L TKA 04/23/23 Unremarkable post op DVT work up  PATIENT SURVEYS:  FOTO 1 current, 47 predicted  COGNITION: Overall cognitive status: Within functional limits for tasks assessed     SENSATION: Denies sensory complaints  EDEMA:  Gross edema about L knee joint and bruising apparent - formal exam deferred given compression stocking/pt attire  MUSCLE LENGTH: deferred  POSTURE: tendency to hold knee in slightly bent position, anteriorly placed while sitting  PALPATION: Gross post  surgical soreness as expected  LOWER EXTREMITY ROM:     Active  Right eval Left eval  Hip flexion    Hip extension    Hip internal rotation    Hip external rotation    Knee extension [facility] Lacking 5 deg   Knee flexion WFL 40 deg active supine   60 deg active seated  87 deg passive seated  (Blank rows = not tested) (Key: WFL = within functional limits not formally assessed, * = concordant pain, s = stiffness/stretching sensation, NT = not tested)   Comments:    LOWER EXTREMITY MMT:    MMT Right eval Left eval  Hip flexion    Hip abduction (modified sitting)    Hip internal rotation    Hip external rotation    Knee flexion 5   Knee extension 5   Ankle dorsiflexion     (Blank rows = not tested) (Key: WFL = within functional limits not formally assessed, * = concordant pain, s = stiffness/stretching sensation, NT = not tested)  Comments: LLE NT due to pain    FUNCTIONAL TESTS:   TUG 42 sec with RW   GAIT: Distance walked: within clinic Assistive device utilized: Environmental consultant - 2 wheeled Level of assistance: Modified independence Comments: step to pattern, discontinuous gait, reduced WB through surgical limb   TODAY'S TREATMENT:                                                                                                                              [facility] Adult PT Treatment:                                                DATE: 05/05/23 Therapeutic Exercise: Knee flex AAROM x10 Knee ext AAROM x10 LAQ x5 (discontinued due to discomfort) Seated quad set x10 LLE HEP handout + education on appropriate performance  PATIENT EDUCATION:  Education details: Pt education on PT impairments, prognosis, and POC. Informed consent. Rationale for interventions, safe/appropriate HEP performance Person educated: Patient and spouse Education method: Explanation, Demonstration, Tactile cues, Verbal cues, and Handouts Education comprehension: verbalized understanding, returned demonstration, verbal cues required, tactile cues required, and needs further education    HOME EXERCISE PROGRAM: Access Code: [email or web address] URL: [email or web address] Date: 05/05/2023 Prepared by: Fransisco Hertz  Exercises - Seated Knee Flexion AAROM  - 2-3 x daily - 7 x weekly - 1 sets - 10 reps - Seated Knee Extension AAROM  - 2-3 x daily - 7 x weekly - 1 sets - 10 reps - Seated Quad Set  - 2-3 x daily - 7 x weekly - 1 sets - 10  reps  ASSESSMENT:  CLINICAL IMPRESSION: Pt is a 57 year old woman who arrives to PT evaluation on this date s/p L TKA 04/23/23. Pt reports difficulty with basic mobility/ADLs due  to pain and post surgical deficits. During today's session pt demonstrates limitations in L knee strength/mobility and functional kinematics as expected postoperatively, which are likely contributing to difficulty with aforementioned activities. Recommend skilled PT to address aforementioned deficits to improve functional independence/tolerance. No adverse events, pt with fairly high pain levels throughout but denies any overt worsening with activity. Pt departs today's session in no acute distress, all voiced questions/concerns addressed appropriately from PT perspective.    OBJECTIVE IMPAIRMENTS: Abnormal gait, decreased activity tolerance, decreased endurance, decreased mobility, difficulty walking, decreased ROM, decreased strength, increased edema, improper body mechanics, postural dysfunction, and pain.   ACTIVITY LIMITATIONS: carrying, lifting, bending, sitting, standing, squatting, sleeping, stairs, transfers, and locomotion level  PARTICIPATION LIMITATIONS: meal prep, cleaning, laundry, driving, shopping, community activity, and occupation  PERSONAL FACTORS: Time since onset of injury/illness/exacerbation are also affecting patient's functional outcome.   REHAB POTENTIAL: Good  CLINICAL DECISION MAKING: Stable/uncomplicated  EVALUATION COMPLEXITY: Low   GOALS: Goals reviewed with patient? No  SHORT TERM GOALS: Target date: 06/02/2023 Pt will demonstrate appropriate understanding and performance of initially prescribed HEP in order to facilitate improved independence with management of symptoms.  Baseline: HEP provided on eval Goal status: INITIAL   2. Pt will score greater than or equal to 24 on FOTO in order to demonstrate improved perception of function due to symptoms.  Baseline: 1  Goal status:  INITIAL    LONG TERM GOALS: Target date: 06/30/2023 Pt will score 47 or greater on FOTO in order to demonstrate improved perception of function due to symptoms.  Baseline: 1 Goal status: INITIAL  2.  Pt will demonstrate at least 0-110 degrees of knee AROM in order to facilitate improved tolerance to functional movements such as walking/stairs.  Baseline: see ROM chart above Goal status: INITIAL  3.  Pt will demonstrate grossly symmetrical knee flex/extension MMT in order to facilitate improved functional strength.  Baseline: see MMT chart above Goal status: INITIAL  4.  Pt will be able to perform TUG in less than or equal to 14 sec with LRAD in order to indicate reduced risk of falling (cutoff score for fall risk 13.5 sec in community dwelling older adults per [facility] et al, 2000)  Baseline: 42 sec RW  Goal status: INITIAL    5. Pt will demonstrate appropriate performance of final prescribed HEP in order to facilitate improved self-management of symptoms post-discharge.   Baseline: initial HEP prescribed  Goal status: INITIAL     PLAN:  PT FREQUENCY: 2x/week  PT DURATION: 8 weeks  PLANNED INTERVENTIONS: Therapeutic exercises, Therapeutic activity, Neuromuscular re-education, Balance training, Gait training, Patient/Family education, Self Care, Joint mobilization, Stair training, Aquatic Therapy, Dry Needling, Electrical stimulation, Cryotherapy, Moist heat, scar mobilization, Taping, Vasopneumatic device, Manual therapy, and Re-evaluation  PLAN FOR NEXT SESSION: Review/update HEP PRN. Work on knee flex/ext mobility as able/tolerated, quad activation. Functional mobility. Symptom modification strategies as indicated/appropriate.    Ashley Murrain PT, DPT 05/05/2023 3:04 PM

## 2023-05-07 ENCOUNTER — Ambulatory Visit: Payer: PRIVATE HEALTH INSURANCE | Admitting: Physician Assistant

## 2023-05-07 ENCOUNTER — Encounter: Payer: Self-pay | Admitting: Physician Assistant

## 2023-05-07 DIAGNOSIS — Z96652 Presence of left artificial knee joint: Secondary | ICD-10-CM

## 2023-05-07 MED ORDER — OXYCODONE-ACETAMINOPHEN 5-325 MG PO TABS: 1.0000 | ORAL_TABLET | Freq: Three times a day (TID) | ORAL | 0 refills | Status: DC | PRN

## 2023-05-07 NOTE — Progress Notes (Signed)
   Post-Op Visit Note   Patient: Cheryl Bush           Date of Birth: Nov 26, 1965           MRN: [id number] Visit Date: 05/07/2023 PCP: Norm Salt, PA   Assessment & Plan:  Chief Complaint:  Chief Complaint  Patient presents with   Left Knee - Follow-up    Left total knee arthroplasty 04/23/2023   Visit Diagnoses:  1. Status post total left knee replacement     Plan: Patient is a pleasant 57 year old female who comes in today 2 weeks status post left total knee replacement 04/23/2023.  She has been doing okay.  She has been taking Percocet for pain.  She has been taking baby aspirin twice daily for DVT prophylaxis.  She recently started outpatient physical therapy and is ambulating with a walker.  She has had quite a bit of bruising to the left lower extremity and was referred out for left lower extremity ultrasound on Monday.  This was negative for DVT.  Examination of the left knee reveals a well-healed surgical incision with nylon sutures in place.  No evidence of infection or cellulitis.  Moderate ecchymosis to the left lower extremity.  Calves are soft and nontender.  She is neurovascular intact distally.  Today, sutures were removed and Steri-Strips applied.  Continue with her baby aspirin twice daily for another 4 weeks.  Continue with outpatient physical therapy.  I refilled her Percocet.  Follow-up in 4 weeks for repeat evaluation and 2 view x-rays left knee.  Call with concerns or questions.  Follow-Up Instructions: Return in about 4 weeks (around 06/04/2023).   Orders:  No orders of the defined types were placed in this encounter.  No orders of the defined types were placed in this encounter.   Imaging: No new imaging  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    Mastitis-left, medial aspect 03/15/2013   Past Medical History:  Diagnosis Date   High cholesterol    Inverted nipple left   abcess removed from areola, causing some scarring and inverted nipple    Family History  Problem Relation Age of Onset   Heart disease Brother     Past Surgical History:  Procedure Laterality Date   KNEE SURGERY     left- arthroscopy   TOTAL KNEE ARTHROPLASTY Left 04/23/2023   Procedure: LEFT TOTAL KNEE ARTHROPLASTY;  Surgeon: Tarry Kos, MD;  Location: [facility] OR;  Service: Orthopedics;  Laterality: Left;   Social History   Occupational History   Not on file  Tobacco Use   Smoking status: Never   Smokeless tobacco: Never  Vaping Use   Vaping status: Never Used  Substance and Sexual Activity   Alcohol use: No   Drug use: No   Sexual activity: Never

## 2023-05-13 ENCOUNTER — Ambulatory Visit: Payer: PRIVATE HEALTH INSURANCE | Admitting: Physical Therapy

## 2023-05-13 DIAGNOSIS — M25662 Stiffness of left knee, not elsewhere classified: Secondary | ICD-10-CM

## 2023-05-13 DIAGNOSIS — M6281 Muscle weakness (generalized): Secondary | ICD-10-CM

## 2023-05-13 DIAGNOSIS — M25562 Pain in left knee: Secondary | ICD-10-CM

## 2023-05-13 NOTE — Therapy (Signed)
OUTPATIENT PHYSICAL THERAPY LOWER EXTREMITY TREATMENT   Patient Name: Cheryl Bush MRN: [id number] DOB:February 20, 1966, 57 y.o., female Today's Date: 05/13/2023  END OF SESSION:  PT End of Session - 05/13/23 1210     Visit Number 2    Number of Visits 17    Date for PT Re-Evaluation 06/30/23    Authorization Type [employer]    Authorization Time Period no auth til visit 40 per appt notes    PT Start Time 1147    PT Stop Time 1230    PT Time Calculation (min) 43 min              Past Medical History:  Diagnosis Date   High cholesterol    Inverted nipple left   abcess removed from areola, causing some scarring and inverted nipple   Past Surgical History:  Procedure Laterality Date   KNEE SURGERY     left- arthroscopy   TOTAL KNEE ARTHROPLASTY Left 04/23/2023   Procedure: LEFT TOTAL KNEE ARTHROPLASTY;  Surgeon: Tarry Kos, MD;  Location: [facility] OR;  Service: Orthopedics;  Laterality: Left;   Patient Active Problem List   Diagnosis Date Noted   Status post total left knee replacement 04/23/2023   Primary osteoarthritis of right knee 07/28/2022   Primary osteoarthritis of left knee 07/28/2022   Leukopenia 09/07/2016   Mastitis-left, medial aspect 03/15/2013    PCP: Norm Salt, PA  REFERRING PROVIDER: Tarry Kos, MD  REFERRING DIAG: [phone] (ICD-10-CM) - S/P total knee arthroplasty, left  THERAPY DIAG:  Left knee pain, unspecified chronicity  Muscle weakness (generalized)  Stiffness of left knee, not elsewhere classified  Rationale for Evaluation and Treatment: Rehabilitation  ONSET DATE: 04/23/23 L TKA   SUBJECTIVE:   SUBJECTIVE STATEMENT: Accompanied by husband per her request.  Pt reports 9/10 pain. She continues CPM and compression stockings. Arrives with RW.    PERTINENT HISTORY: L TKA 04/23/23 PAIN:  Are you having pain: 9/10 Location/description: L knee pain  - aggravating factors: standing/walking, typical household activities,  difficulty sleeping  - Easing factors: icing, rest    PRECAUTIONS: None  WEIGHT BEARING RESTRICTIONS: No  FALLS:  Has patient fallen in last 6 months? No  LIVING ENVIRONMENT: 4 steps to enter home no rail, 1 level. Lives with her husband and her son (34 y.o). Pt typically does majority of housework.   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 Has to do lifting, variable weights  PLOF: Independent  PATIENT GOALS: get back to normal   NEXT MD VISIT: Friday (July 12)  OBJECTIVE:   DIAGNOSTIC FINDINGS:  S/p L TKA 04/23/23 Unremarkable post op DVT work up  PATIENT SURVEYS:  FOTO 1 current, 47 predicted  COGNITION: Overall cognitive status: Within functional limits for tasks assessed     SENSATION: Denies sensory complaints  EDEMA:  Gross edema about L knee joint and bruising apparent - formal exam deferred given compression stocking/pt attire  MUSCLE LENGTH: deferred  POSTURE: tendency to hold knee in slightly bent position, anteriorly placed while sitting  PALPATION: Gross post surgical soreness as expected  LOWER EXTREMITY ROM:     Active  Right eval Left eval Left 05/13/23  Hip flexion     Hip extension     Hip internal rotation     Hip external rotation     Knee extension [facility] Lacking 5 deg    Knee flexion WFL 40 deg active supine  60 deg active seated  87 deg passive seated A 96 supine  (Blank rows = not tested) (Key: WFL = within functional limits not formally assessed, * = concordant pain, s = stiffness/stretching sensation, NT = not tested)  Comments:    LOWER EXTREMITY MMT:    MMT Right eval Left eval  Hip flexion    Hip abduction (modified sitting)    Hip internal rotation    Hip external rotation    Knee flexion 5   Knee extension 5   Ankle dorsiflexion     (Blank rows = not tested) (Key: WFL = within functional limits not formally assessed, * = concordant pain, s =  stiffness/stretching sensation, NT = not tested)  Comments: LLE NT due to pain    FUNCTIONAL TESTS:   TUG 42 sec with RW   GAIT: Distance walked: within clinic Assistive device utilized: Environmental consultant - 2 wheeled Level of assistance: Modified independence Comments: step to pattern, discontinuous gait, reduced WB through surgical limb   TODAY'S TREATMENT:                                                                                                                              [facility] Adult PT Treatment:                                                DATE: 05/13/23 Therapeutic Exercise: Seated heel slide with cloth on floor x 10 Seated Qs 5 sec x 10 LAQ x 10, 5 ssec Seated hamstring stretch 10 sec x 3  Supine heel slide on sliding board x 5 Supine AA heel slide on sliding board x 5  SLR with strap assist x 10 Supine QS towel roll 5 sec x 10  Modalities: Vaso x 10 min low pressure,coldest setting Self Care: Benefit of LE elevation to reduce edema   [facility] Adult PT Treatment:                                                DATE: 05/05/23 Therapeutic Exercise: Knee flex AAROM x10 Knee ext AAROM x10 LAQ x5 (discontinued due to discomfort) Seated quad set x10 LLE HEP handout + education on appropriate performance  PATIENT EDUCATION:  Education details: Pt education on PT impairments, prognosis, and POC. Informed consent. Rationale for interventions, safe/appropriate HEP performance Person educated: Patient and spouse Education method: Explanation, Demonstration, Tactile cues, Verbal cues, and Handouts Education comprehension: verbalized understanding, returned demonstration, verbal cues required, tactile cues required, and needs further education    HOME EXERCISE PROGRAM: Access Code: [email or web address] URL: [email or web address] Date: 05/05/2023 Prepared by: Fransisco Hertz  Exercises - Seated Knee Flexion AAROM  - 2-3 x daily -  7 x weekly - 1 sets - 10 reps - Seated Knee  Extension AAROM  - 2-3 x daily - 7 x weekly - 1 sets - 10 reps - Seated Quad Set  - 2-3 x daily - 7 x weekly - 1 sets - 10 reps  ASSESSMENT:  CLINICAL IMPRESSION: Pt is a 57 year old woman who arrives to PT treatment s/p L TKA 04/23/23. She demonstrates improved ROM achieving 96 AROM knee flexion supine and min quad lag with LAQ.  She reports continued high levels of pain and continued edema. She is using ice at home although she is not elevating her leg due to discomfort. Discussed benefit of LE  elevation to reduce edema. Used bolster in clinic and she reported being comfortable using the bolster during vasopneumatic device treatment. Will benefit from continued skilled PT to address aforementioned deficits to improve functional independence/tolerance.    OBJECTIVE IMPAIRMENTS: Abnormal gait, decreased activity tolerance, decreased endurance, decreased mobility, difficulty walking, decreased ROM, decreased strength, increased edema, improper body mechanics, postural dysfunction, and pain.   ACTIVITY LIMITATIONS: carrying, lifting, bending, sitting, standing, squatting, sleeping, stairs, transfers, and locomotion level  PARTICIPATION LIMITATIONS: meal prep, cleaning, laundry, driving, shopping, community activity, and occupation  PERSONAL FACTORS: Time since onset of injury/illness/exacerbation are also affecting patient's functional outcome.   REHAB POTENTIAL: Good  CLINICAL DECISION MAKING: Stable/uncomplicated  EVALUATION COMPLEXITY: Low   GOALS: Goals reviewed with patient? No  SHORT TERM GOALS: Target date: 06/02/2023 Pt will demonstrate appropriate understanding and performance of initially prescribed HEP in order to facilitate improved independence with management of symptoms.  Baseline: HEP provided on eval Goal status: INITIAL   2. Pt will score greater than or equal to 24 on FOTO in order to demonstrate improved perception of function due to symptoms.  Baseline: 1  Goal  status: INITIAL   LONG TERM GOALS: Target date: 06/30/2023 Pt will score 47 or greater on FOTO in order to demonstrate improved perception of function due to symptoms.  Baseline: 1 Goal status: INITIAL  2.  Pt will demonstrate at least 0-110 degrees of knee AROM in order to facilitate improved tolerance to functional movements such as walking/stairs.  Baseline: see ROM chart above Goal status: INITIAL  3.  Pt will demonstrate grossly symmetrical knee flex/extension MMT in order to facilitate improved functional strength.  Baseline: see MMT chart above Goal status: INITIAL  4.  Pt will be able to perform TUG in less than or equal to 14 sec with LRAD in order to indicate reduced risk of falling (cutoff score for fall risk 13.5 sec in community dwelling older adults per [facility] et al, 2000)  Baseline: 42 sec RW  Goal status: INITIAL    5. Pt will demonstrate appropriate performance of final prescribed HEP in order to facilitate improved self-management of symptoms post-discharge.   Baseline: initial HEP prescribed  Goal status: INITIAL     PLAN:  PT FREQUENCY: 2x/week  PT DURATION: 8 weeks  PLANNED INTERVENTIONS: Therapeutic exercises, Therapeutic activity, Neuromuscular re-education, Balance training, Gait training, Patient/Family education, Self Care, Joint mobilization, Stair training, Aquatic Therapy, Dry Needling, Electrical stimulation, Cryotherapy, Moist heat, scar mobilization, Taping, Vasopneumatic device, Manual therapy, and Re-evaluation  PLAN FOR NEXT SESSION: Review/update HEP PRN. Work on knee flex/ext mobility as able/tolerated, quad activation. Functional mobility. Symptom modification strategies as indicated/appropriate.    Jannette Spanner, PTA 05/13/23 12:32 PM Phone: [phone] Fax: [phone]

## 2023-05-17 ENCOUNTER — Ambulatory Visit: Payer: PRIVATE HEALTH INSURANCE

## 2023-05-17 DIAGNOSIS — M6281 Muscle weakness (generalized): Secondary | ICD-10-CM

## 2023-05-17 DIAGNOSIS — M25562 Pain in left knee: Secondary | ICD-10-CM

## 2023-05-17 DIAGNOSIS — R2689 Other abnormalities of gait and mobility: Secondary | ICD-10-CM

## 2023-05-17 DIAGNOSIS — M25662 Stiffness of left knee, not elsewhere classified: Secondary | ICD-10-CM

## 2023-05-17 DIAGNOSIS — R600 Localized edema: Secondary | ICD-10-CM

## 2023-05-17 NOTE — Therapy (Signed)
OUTPATIENT PHYSICAL THERAPY LOWER EXTREMITY TREATMENT   Patient Name: Cheryl Bush MRN: [id number] DOB:[birth year], 57 y.o., female Today's Date: 05/17/2023  END OF SESSION:  PT End of Session - 05/17/23 1233     Visit Number 3    Number of Visits 17    Date for PT Re-Evaluation 06/30/23    Authorization Type [employer]    Authorization Time Period no auth til visit 40 per appt notes    PT Start Time 1232    PT Stop Time 1325    PT Time Calculation (min) 53 min              Past Medical History:  Diagnosis Date   High cholesterol    Inverted nipple left   abcess removed from areola, causing some scarring and inverted nipple   Past Surgical History:  Procedure Laterality Date   KNEE SURGERY     left- arthroscopy   TOTAL KNEE ARTHROPLASTY Left 04/23/2023   Procedure: LEFT TOTAL KNEE ARTHROPLASTY;  Surgeon: Tarry Kos, MD;  Location: [facility] OR;  Service: Orthopedics;  Laterality: Left;   Patient Active Problem List   Diagnosis Date Noted   Status post total left knee replacement 04/23/2023   Primary osteoarthritis of right knee 07/28/2022   Primary osteoarthritis of left knee 07/28/2022   Leukopenia 09/07/2016   Mastitis-left, medial aspect 03/15/2013    PCP: Norm Salt, PA  REFERRING PROVIDER: Tarry Kos, MD  REFERRING DIAG: [phone] (ICD-10-CM) - S/P total knee arthroplasty, left  THERAPY DIAG:  Left knee pain, unspecified chronicity  Muscle weakness (generalized)  Stiffness of left knee, not elsewhere classified  Localized edema  Other abnormalities of gait and mobility  Rationale for Evaluation and Treatment: Rehabilitation  ONSET DATE: 04/23/23 L TKA   SUBJECTIVE:   SUBJECTIVE STATEMENT:  "It's pain." She reports using the CPM for 1-2 hours at a time. Has been completing her HEP.    PERTINENT HISTORY: L TKA 04/23/23 PAIN:  Are you having pain: 9/10 Location/description: Lt anterior knee  - aggravating factors: standing/walking,  typical household activities, difficulty sleeping  - Easing factors: icing, rest    PRECAUTIONS: None  WEIGHT BEARING RESTRICTIONS: No  FALLS:  Has patient fallen in last 6 months? No  LIVING ENVIRONMENT: 4 steps to enter home no rail, 1 level. Lives with her husband and her son (35 y.o). Pt typically does majority of housework.   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 Has to do lifting, variable weights  PLOF: Independent  PATIENT GOALS: get back to normal   NEXT MD VISIT: Friday (July 12)  OBJECTIVE:   DIAGNOSTIC FINDINGS:  S/p L TKA 04/23/23 Unremarkable post op DVT work up  PATIENT SURVEYS:  FOTO 1 current, 47 predicted  COGNITION: Overall cognitive status: Within functional limits for tasks assessed     SENSATION: Denies sensory complaints  EDEMA:  Gross edema about L knee joint and bruising apparent - formal exam deferred given compression stocking/pt attire  MUSCLE LENGTH: deferred  POSTURE: tendency to hold knee in slightly bent position, anteriorly placed while sitting  PALPATION: Gross post surgical soreness as expected  LOWER EXTREMITY ROM:     Active  Right eval Left eval Left 05/13/23 05/17/23  Hip flexion      Hip extension      Hip internal rotation      Hip external rotation      Knee extension [facility] Lacking  5 deg     Knee flexion WFL 40 deg active supine   60 deg active seated  87 deg passive seated A 96 supine Left:90  (Blank rows = not tested) (Key: WFL = within functional limits not formally assessed, * = concordant pain, s = stiffness/stretching sensation, NT = not tested)  Comments:    LOWER EXTREMITY MMT:    MMT Right eval Left eval  Hip flexion    Hip abduction (modified sitting)    Hip internal rotation    Hip external rotation    Knee flexion 5   Knee extension 5   Ankle dorsiflexion     (Blank rows = not tested) (Key: WFL = within functional limits not formally  assessed, * = concordant pain, s = stiffness/stretching sensation, NT = not tested)  Comments: LLE NT due to pain    FUNCTIONAL TESTS:   TUG 42 sec with RW   GAIT: Distance walked: within clinic Assistive device utilized: Environmental consultant - 2 wheeled Level of assistance: Modified independence Comments: step to pattern, discontinuous gait, reduced WB through surgical limb   TODAY'S TREATMENT:                                                                                                                              [facility] Adult PT Treatment:                                                DATE: 05/17/23 Therapeutic Exercise: Supine heel slides x 10  Quad set x 10; 5 sec hold  Seated march 2 x 10  SLR attempted unable  Seated march 2 x 10  LAQ 2 x 10  Seated knee flexion AAROM x 10; 5 sec hold  Ankle pumps x 20  Updated HEP   Manual Therapy: Lt knee PROM to tolerance flexion/extension   Therapeutic Activity: Sit to supine transfer with use of RLE to support LLE  Self Care: Benefits of active movement vs. relying upon CPM for ROM.   Modalities: Game ready x 10 min low pressure,coldest setting  [facility] Adult PT Treatment:                                                DATE: 05/13/23 Therapeutic Exercise: Seated heel slide with cloth on floor x 10 Seated Qs 5 sec x 10 LAQ x 10, 5 ssec Seated hamstring stretch 10 sec x 3  Supine heel slide on sliding board x 5 Supine AA heel slide on sliding board x 5  SLR with strap assist x 10 Supine QS towel roll 5 sec x 10  Modalities: Vaso x 10 min low pressure,coldest setting Self Care:  Benefit of LE elevation to reduce edema   [facility] Adult PT Treatment:                                                DATE: 05/05/23 Therapeutic Exercise: Knee flex AAROM x10 Knee ext AAROM x10 LAQ x5 (discontinued due to discomfort) Seated quad set x10 LLE HEP handout + education on appropriate performance  PATIENT EDUCATION:  Education details: HEP  update Person educated: Patient and spouse Education method: Explanation, Demonstration, Tactile cues, Verbal cues, and Handouts Education comprehension: verbalized understanding, returned demonstration, verbal cues required, tactile cues required, and needs further education    HOME EXERCISE PROGRAM: Access Code: [email or web address] URL: [email or web address] Date: 05/05/2023 Prepared by: Fransisco Hertz  Exercises - Seated Knee Flexion AAROM  - 2-3 x daily - 7 x weekly - 1 sets - 10 reps - Seated Knee Extension AAROM  - 2-3 x daily - 7 x weekly - 1 sets - 10 reps - Seated Quad Set  - 2-3 x daily - 7 x weekly - 1 sets - 10 reps  ASSESSMENT:  CLINICAL IMPRESSION: Pt arrives with high pain levels about the Lt knee, though in NAD. She tolerates PROM fairly well with minimal guarding at available end range of flexion. Discussed necessity of completing ROM activity on her HEP and not solely relying upon CPM for ROM with patient verbalizing understanding. Focused on knee ROM and strengthening with good tolerance. She is unable to complete SLR secondary to weakness, but does exhibit good VMO activation with quad set. Instructed patient on utilizing RLE to assist in lifting/lowering the LLE with sit <> supine transfer to improve her independence/ease of transfers with patient able to perform independently. HEP was updated to include further strengthening/ROM.   OBJECTIVE IMPAIRMENTS: Abnormal gait, decreased activity tolerance, decreased endurance, decreased mobility, difficulty walking, decreased ROM, decreased strength, increased edema, improper body mechanics, postural dysfunction, and pain.   ACTIVITY LIMITATIONS: carrying, lifting, bending, sitting, standing, squatting, sleeping, stairs, transfers, and locomotion level  PARTICIPATION LIMITATIONS: meal prep, cleaning, laundry, driving, shopping, community activity, and occupation  PERSONAL FACTORS: Time since onset of  injury/illness/exacerbation are also affecting patient's functional outcome.   REHAB POTENTIAL: Good  CLINICAL DECISION MAKING: Stable/uncomplicated  EVALUATION COMPLEXITY: Low   GOALS: Goals reviewed with patient? No  SHORT TERM GOALS: Target date: 06/02/2023 Pt will demonstrate appropriate understanding and performance of initially prescribed HEP in order to facilitate improved independence with management of symptoms.  Baseline: HEP provided on eval Goal status: INITIAL   2. Pt will score greater than or equal to 24 on FOTO in order to demonstrate improved perception of function due to symptoms.  Baseline: 1  Goal status: INITIAL   LONG TERM GOALS: Target date: 06/30/2023 Pt will score 47 or greater on FOTO in order to demonstrate improved perception of function due to symptoms.  Baseline: 1 Goal status: INITIAL  2.  Pt will demonstrate at least 0-110 degrees of knee AROM in order to facilitate improved tolerance to functional movements such as walking/stairs.  Baseline: see ROM chart above Goal status: INITIAL  3.  Pt will demonstrate grossly symmetrical knee flex/extension MMT in order to facilitate improved functional strength.  Baseline: see MMT chart above Goal status: INITIAL  4.  Pt will be able to perform TUG in less than or equal to 14 sec with LRAD  in order to indicate reduced risk of falling (cutoff score for fall risk 13.5 sec in community dwelling older adults per [facility] et al, 2000)  Baseline: 42 sec RW  Goal status: INITIAL    5. Pt will demonstrate appropriate performance of final prescribed HEP in order to facilitate improved self-management of symptoms post-discharge.   Baseline: initial HEP prescribed  Goal status: INITIAL     PLAN:  PT FREQUENCY: 2x/week  PT DURATION: 8 weeks  PLANNED INTERVENTIONS: Therapeutic exercises, Therapeutic activity, Neuromuscular re-education, Balance training, Gait training, Patient/Family education, Self Care,  Joint mobilization, Stair training, Aquatic Therapy, Dry Needling, Electrical stimulation, Cryotherapy, Moist heat, scar mobilization, Taping, Vasopneumatic device, Manual therapy, and Re-evaluation  PLAN FOR NEXT SESSION: Review/update HEP PRN. Work on knee flex/ext mobility as able/tolerated, quad activation. Functional mobility. Symptom modification strategies as indicated/appropriate.   Letitia Libra, PT, DPT, ATC 05/17/23 1:29 PM

## 2023-05-18 ENCOUNTER — Telehealth: Payer: Self-pay

## 2023-05-18 NOTE — Telephone Encounter (Signed)
LVM asking patient to call back regarding insurance coverage for PT.   Letitia Libra, PT, DPT, ATC 05/18/23 1:47 PM

## 2023-05-19 ENCOUNTER — Ambulatory Visit: Payer: PRIVATE HEALTH INSURANCE

## 2023-05-19 ENCOUNTER — Telehealth: Payer: Self-pay

## 2023-05-19 NOTE — Telephone Encounter (Signed)
Advised patient to call her insurance company to determine co-pay amount at various in-network PT clinics. She requested to cancel her visit on 7/24, so she can look into co-payments at other clinics.   Letitia Libra, PT, DPT, ATC 05/19/23 12:02 PM

## 2023-05-24 ENCOUNTER — Ambulatory Visit: Payer: PRIVATE HEALTH INSURANCE

## 2023-05-24 DIAGNOSIS — R2689 Other abnormalities of gait and mobility: Secondary | ICD-10-CM

## 2023-05-24 DIAGNOSIS — M25562 Pain in left knee: Secondary | ICD-10-CM

## 2023-05-24 DIAGNOSIS — R600 Localized edema: Secondary | ICD-10-CM

## 2023-05-24 DIAGNOSIS — M25662 Stiffness of left knee, not elsewhere classified: Secondary | ICD-10-CM

## 2023-05-24 DIAGNOSIS — M6281 Muscle weakness (generalized): Secondary | ICD-10-CM

## 2023-05-24 NOTE — Therapy (Signed)
OUTPATIENT PHYSICAL THERAPY LOWER EXTREMITY TREATMENT   Patient Name: Cheryl Bush MRN: [id number] DOB:February 16, 1966, 57 y.o., female Today's Date: 05/24/2023  END OF SESSION:  PT End of Session - 05/24/23 1055     Visit Number 4    Number of Visits 17    Date for PT Re-Evaluation 06/30/23    Authorization Type [employer]    Authorization Time Period no auth til visit 40 per appt notes    PT Start Time 1058    PT Stop Time 1150    PT Time Calculation (min) 52 min    Activity Tolerance Patient tolerated treatment well    Behavior During Therapy WFL for tasks assessed/performed              Past Medical History:  Diagnosis Date   High cholesterol    Inverted nipple left   abcess removed from areola, causing some scarring and inverted nipple   Past Surgical History:  Procedure Laterality Date   KNEE SURGERY     left- arthroscopy   TOTAL KNEE ARTHROPLASTY Left 04/23/2023   Procedure: LEFT TOTAL KNEE ARTHROPLASTY;  Surgeon: Tarry Kos, MD;  Location: [facility] OR;  Service: Orthopedics;  Laterality: Left;   Patient Active Problem List   Diagnosis Date Noted   Status post total left knee replacement 04/23/2023   Primary osteoarthritis of right knee 07/28/2022   Primary osteoarthritis of left knee 07/28/2022   Leukopenia 09/07/2016   Mastitis-left, medial aspect 03/15/2013    PCP: Norm Salt, PA  REFERRING PROVIDER: Tarry Kos, MD  REFERRING DIAG: [phone] (ICD-10-CM) - S/P total knee arthroplasty, left  THERAPY DIAG:  Left knee pain, unspecified chronicity  Muscle weakness (generalized)  Stiffness of left knee, not elsewhere classified  Localized edema  Other abnormalities of gait and mobility  Rationale for Evaluation and Treatment: Rehabilitation  ONSET DATE: 04/23/23 L TKA   SUBJECTIVE:   SUBJECTIVE STATEMENT:  "It feels numb and hurts in here."(Points to the anterior aspect of the knee)   PERTINENT HISTORY: L TKA 04/23/23 PAIN:  Are you  having pain: 8/10 Location/description: Lt anterior knee  - aggravating factors: standing/walking, typical household activities, difficulty sleeping  - Easing factors: icing, rest    PRECAUTIONS: None  WEIGHT BEARING RESTRICTIONS: No  FALLS:  Has patient fallen in last 6 months? No  LIVING ENVIRONMENT: 4 steps to enter home no rail, 1 level. Lives with her husband and her son (29 y.o). Pt typically does majority of housework.   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 Has to do lifting, variable weights  PLOF: Independent  PATIENT GOALS: get back to normal   NEXT MD VISIT: Friday (July 12)  OBJECTIVE:   DIAGNOSTIC FINDINGS:  S/p L TKA 04/23/23 Unremarkable post op DVT work up  PATIENT SURVEYS:  FOTO 1 current, 47 predicted  COGNITION: Overall cognitive status: Within functional limits for tasks assessed     SENSATION: Denies sensory complaints  EDEMA:  Gross edema about L knee joint and bruising apparent - formal exam deferred given compression stocking/pt attire  MUSCLE LENGTH: deferred  POSTURE: tendency to hold knee in slightly bent position, anteriorly placed while sitting  PALPATION: Gross post surgical soreness as expected  LOWER EXTREMITY ROM:     Active  Right eval Left eval Left 05/13/23 05/17/23 05/24/23  Hip flexion       Hip extension       Hip internal rotation  Hip external rotation       Knee extension [facility] Lacking 5 deg      Knee flexion WFL 40 deg active supine   60 deg active seated  87 deg passive seated A 96 supine Left:90 Left: 100   (Blank rows = not tested) (Key: WFL = within functional limits not formally assessed, * = concordant pain, s = stiffness/stretching sensation, NT = not tested)  Comments:    LOWER EXTREMITY MMT:    MMT Right eval Left eval  Hip flexion    Hip abduction (modified sitting)    Hip internal rotation    Hip external rotation    Knee flexion 5    Knee extension 5   Ankle dorsiflexion     (Blank rows = not tested) (Key: WFL = within functional limits not formally assessed, * = concordant pain, s = stiffness/stretching sensation, NT = not tested)  Comments: LLE NT due to pain    FUNCTIONAL TESTS:   TUG 42 sec with RW   GAIT: Distance walked: within clinic Assistive device utilized: Environmental consultant - 2 wheeled Level of assistance: Modified independence Comments: step to pattern, discontinuous gait, reduced WB through surgical limb   TODAY'S TREATMENT:                                                                                                                              [facility] Adult PT Treatment:                                                DATE: 05/24/23 Therapeutic Exercise: Supine heel slides x 10; 5 sec hold  Quad sets x 10; 5 sec hold  SLR partial range 2 x 5  LAQ 2 x 10  Sit to stand raised height no UE support 1 x 10  Standing weight shifts lateral 2 x 10  Manual Therapy: Lt knee PROM to tolerance flexion/extension   Modalities: Game ready x 10 minutes Lt knee, 38 degrees     [facility] Adult PT Treatment:                                                DATE: 05/17/23 Therapeutic Exercise: Supine heel slides x 10  Quad set x 10; 5 sec hold  Seated march 2 x 10  SLR attempted unable  Seated march 2 x 10  LAQ 2 x 10  Seated knee flexion AAROM x 10; 5 sec hold  Ankle pumps x 20  Updated HEP   Manual Therapy: Lt knee PROM to tolerance flexion/extension   Therapeutic Activity: Sit to supine transfer with use of RLE to support LLE  Self Care: Benefits of active  movement vs. relying upon CPM for ROM.   Modalities: Game ready x 10 min low pressure,coldest setting  [facility] Adult PT Treatment:                                                DATE: 05/13/23 Therapeutic Exercise: Seated heel slide with cloth on floor x 10 Seated Qs 5 sec x 10 LAQ x 10, 5 ssec Seated hamstring stretch 10 sec x 3  Supine heel slide on  sliding board x 5 Supine AA heel slide on sliding board x 5  SLR with strap assist x 10 Supine QS towel roll 5 sec x 10  Modalities: Vaso x 10 min low pressure,coldest setting Self Care: Benefit of LE elevation to reduce edema PATIENT EDUCATION:  Education details: HEP review Person educated: Patient and spouse Education method: Explanation Education comprehension: verbalized understanding  HOME EXERCISE PROGRAM: Access Code: [email or web address] URL: [email or web address] Date: 05/05/2023 Prepared by: Fransisco Hertz  Exercises - Seated Knee Flexion AAROM  - 2-3 x daily - 7 x weekly - 1 sets - 10 reps - Seated Knee Extension AAROM  - 2-3 x daily - 7 x weekly - 1 sets - 10 reps - Seated Quad Set  - 2-3 x daily - 7 x weekly - 1 sets - 10 reps  ASSESSMENT:  CLINICAL IMPRESSION: Pt arrives with high pain levels about the Lt anterior knee, though in NAD. Her knee flexion AROM continues to improve achieving 100 degrees today. Focused on knee mobility,quad strengthening, and introduced standing activity today with good tolerance. She is able to complete partial range SLR without quad lag. With sit to stand she compensates with Rt hip shift during ascent/descent that is slightly improved with reps and cueing requiring continued emphasis at future sessions. No changes made to HEP this visit.   OBJECTIVE IMPAIRMENTS: Abnormal gait, decreased activity tolerance, decreased endurance, decreased mobility, difficulty walking, decreased ROM, decreased strength, increased edema, improper body mechanics, postural dysfunction, and pain.   ACTIVITY LIMITATIONS: carrying, lifting, bending, sitting, standing, squatting, sleeping, stairs, transfers, and locomotion level  PARTICIPATION LIMITATIONS: meal prep, cleaning, laundry, driving, shopping, community activity, and occupation  PERSONAL FACTORS: Time since onset of injury/illness/exacerbation are also affecting patient's functional outcome.    REHAB POTENTIAL: Good  CLINICAL DECISION MAKING: Stable/uncomplicated  EVALUATION COMPLEXITY: Low   GOALS: Goals reviewed with patient? No  SHORT TERM GOALS: Target date: 06/02/2023 Pt will demonstrate appropriate understanding and performance of initially prescribed HEP in order to facilitate improved independence with management of symptoms.  Baseline: HEP provided on eval Goal status: INITIAL   2. Pt will score greater than or equal to 24 on FOTO in order to demonstrate improved perception of function due to symptoms.  Baseline: 1  Goal status: INITIAL   LONG TERM GOALS: Target date: 06/30/2023 Pt will score 47 or greater on FOTO in order to demonstrate improved perception of function due to symptoms.  Baseline: 1 Goal status: INITIAL  2.  Pt will demonstrate at least 0-110 degrees of knee AROM in order to facilitate improved tolerance to functional movements such as walking/stairs.  Baseline: see ROM chart above Goal status: INITIAL  3.  Pt will demonstrate grossly symmetrical knee flex/extension MMT in order to facilitate improved functional strength.  Baseline: see MMT chart above Goal status: INITIAL  4.  Pt will be able to  perform TUG in less than or equal to 14 sec with LRAD in order to indicate reduced risk of falling (cutoff score for fall risk 13.5 sec in community dwelling older adults per [facility] et al, 2000)  Baseline: 42 sec RW  Goal status: INITIAL    5. Pt will demonstrate appropriate performance of final prescribed HEP in order to facilitate improved self-management of symptoms post-discharge.   Baseline: initial HEP prescribed  Goal status: INITIAL     PLAN:  PT FREQUENCY: 2x/week  PT DURATION: 8 weeks  PLANNED INTERVENTIONS: Therapeutic exercises, Therapeutic activity, Neuromuscular re-education, Balance training, Gait training, Patient/Family education, Self Care, Joint mobilization, Stair training, Aquatic Therapy, Dry Needling, Electrical  stimulation, Cryotherapy, Moist heat, scar mobilization, Taping, Vasopneumatic device, Manual therapy, and Re-evaluation  PLAN FOR NEXT SESSION: Review/update HEP PRN. Knee ROM; gait training (weight shifts), sit to stands, quad strengthening.   Letitia Libra, PT, DPT, ATC 05/24/23 11:41 AM

## 2023-05-26 ENCOUNTER — Ambulatory Visit: Payer: PRIVATE HEALTH INSURANCE

## 2023-05-26 DIAGNOSIS — M25562 Pain in left knee: Secondary | ICD-10-CM | POA: Diagnosis not present

## 2023-05-26 DIAGNOSIS — R2689 Other abnormalities of gait and mobility: Secondary | ICD-10-CM

## 2023-05-26 DIAGNOSIS — R600 Localized edema: Secondary | ICD-10-CM

## 2023-05-26 DIAGNOSIS — M6281 Muscle weakness (generalized): Secondary | ICD-10-CM

## 2023-05-26 DIAGNOSIS — M25662 Stiffness of left knee, not elsewhere classified: Secondary | ICD-10-CM

## 2023-05-26 NOTE — Therapy (Signed)
OUTPATIENT PHYSICAL THERAPY LOWER EXTREMITY TREATMENT   Patient Name: Cheryl Bush MRN: [id number] DOB:21-Jun-1966, 57 y.o., female Today's Date: 05/26/2023  END OF SESSION:  PT End of Session - 05/26/23 1040     Visit Number 5    Number of Visits 17    Date for PT Re-Evaluation 06/30/23    Authorization Type [employer]    Authorization Time Period no auth til visit 40 per appt notes    PT Start Time 1045    PT Stop Time 1130    PT Time Calculation (min) 45 min    Activity Tolerance Patient tolerated treatment well    Behavior During Therapy WFL for tasks assessed/performed               Past Medical History:  Diagnosis Date   High cholesterol    Inverted nipple left   abcess removed from areola, causing some scarring and inverted nipple   Past Surgical History:  Procedure Laterality Date   KNEE SURGERY     left- arthroscopy   TOTAL KNEE ARTHROPLASTY Left 04/23/2023   Procedure: LEFT TOTAL KNEE ARTHROPLASTY;  Surgeon: Tarry Kos, MD;  Location: [facility] OR;  Service: Orthopedics;  Laterality: Left;   Patient Active Problem List   Diagnosis Date Noted   Status post total left knee replacement 04/23/2023   Primary osteoarthritis of right knee 07/28/2022   Primary osteoarthritis of left knee 07/28/2022   Leukopenia 09/07/2016   Mastitis-left, medial aspect 03/15/2013    PCP: Norm Salt, PA  REFERRING PROVIDER: Tarry Kos, MD  REFERRING DIAG: [phone] (ICD-10-CM) - S/P total knee arthroplasty, left  THERAPY DIAG:  Left knee pain, unspecified chronicity  Muscle weakness (generalized)  Stiffness of left knee, not elsewhere classified  Localized edema  Other abnormalities of gait and mobility  Rationale for Evaluation and Treatment: Rehabilitation  ONSET DATE: 04/23/23 L TKA   SUBJECTIVE:   SUBJECTIVE STATEMENT: Patient reports her pain is very high today, stating that she felt ok after her last session, but yesterday and today her pain has been  bad.    PERTINENT HISTORY: L TKA 04/23/23 PAIN:  Are you having pain: 9/10 Location/description: Lt anterior knee  - aggravating factors: standing/walking, typical household activities, difficulty sleeping  - Easing factors: icing, rest    PRECAUTIONS: None  WEIGHT BEARING RESTRICTIONS: No  FALLS:  Has patient fallen in last 6 months? No  LIVING ENVIRONMENT: 4 steps to enter home no rail, 1 level. Lives with her husband and her son (39 y.o). Pt typically does majority of housework.   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 Has to do lifting, variable weights  PLOF: Independent  PATIENT GOALS: get back to normal   NEXT MD VISIT: Friday (July 12)  OBJECTIVE:   DIAGNOSTIC FINDINGS:  S/p L TKA 04/23/23 Unremarkable post op DVT work up  PATIENT SURVEYS:  FOTO 1 current, 47 predicted  COGNITION: Overall cognitive status: Within functional limits for tasks assessed     SENSATION: Denies sensory complaints  EDEMA:  Gross edema about L knee joint and bruising apparent - formal exam deferred given compression stocking/pt attire  MUSCLE LENGTH: deferred  POSTURE: tendency to hold knee in slightly bent position, anteriorly placed while sitting  PALPATION: Gross post surgical soreness as expected  LOWER EXTREMITY ROM:     Active  Right eval Left eval Left 05/13/23 05/17/23 05/24/23 05/26/23  Hip flexion  Hip extension        Hip internal rotation        Hip external rotation        Knee extension [facility] Lacking 5 deg       Knee flexion WFL 40 deg active supine   60 deg active seated  87 deg passive seated A 96 supine Left:90 Left: 100  106  (Blank rows = not tested) (Key: WFL = within functional limits not formally assessed, * = concordant pain, s = stiffness/stretching sensation, NT = not tested)  Comments:    LOWER EXTREMITY MMT:    MMT Right eval Left eval  Hip flexion    Hip abduction (modified  sitting)    Hip internal rotation    Hip external rotation    Knee flexion 5   Knee extension 5   Ankle dorsiflexion     (Blank rows = not tested) (Key: WFL = within functional limits not formally assessed, * = concordant pain, s = stiffness/stretching sensation, NT = not tested)  Comments: LLE NT due to pain    FUNCTIONAL TESTS:   TUG 42 sec with RW   GAIT: Distance walked: within clinic Assistive device utilized: Environmental consultant - 2 wheeled Level of assistance: Modified independence Comments: step to pattern, discontinuous gait, reduced WB through surgical limb   TODAY'S TREATMENT:     [facility] Adult PT Treatment:                                                DATE: 05/26/23 Therapeutic Exercise: Supine heel slides x 10; 5 sec hold  Quad sets x 10; 5 sec hold  SLR partial range x10, x5 Sit to stand raised height no UE support 1 x 10 - cues for weight shift Step tap with weight shift 4" x10 Knee flexion stretch 10" stacked steps x10 Manual Therapy: Lt knee PROM to tolerance flexion/extension  Modalities: Game ready x 10 minutes Lt knee, 38 degrees  Self Care: Benefits of elevation, icing, HEP, discussion of recovery timeline                                                                                                                            [facility] Adult PT Treatment:                                                DATE: 05/24/23 Therapeutic Exercise: Supine heel slides x 10; 5 sec hold  Quad sets x 10; 5 sec hold  SLR partial range 2 x 5  LAQ 2 x 10  Sit to stand raised height no UE support 1 x 10  Standing weight shifts lateral 2 x 10  Manual Therapy: Lt knee PROM to tolerance flexion/extension   Modalities: Game ready x 10 minutes Lt knee, 38 degrees     [facility] Adult PT Treatment:                                                DATE: 05/17/23 Therapeutic Exercise: Supine heel slides x 10  Quad set x 10; 5 sec hold  Seated march 2 x 10  SLR attempted unable  Seated  march 2 x 10  LAQ 2 x 10  Seated knee flexion AAROM x 10; 5 sec hold  Ankle pumps x 20  Updated HEP   Manual Therapy: Lt knee PROM to tolerance flexion/extension   Therapeutic Activity: Sit to supine transfer with use of RLE to support LLE  Self Care: Benefits of active movement vs. relying upon CPM for ROM.   Modalities: Game ready x 10 min low pressure,coldest setting   PATIENT EDUCATION:  Education details: HEP review Person educated: Patient and spouse Education method: Explanation Education comprehension: verbalized understanding  HOME EXERCISE PROGRAM: Access Code: [email or web address] URL: [email or web address] Date: 05/05/2023 Prepared by: Fransisco Hertz  Exercises - Seated Knee Flexion AAROM  - 2-3 x daily - 7 x weekly - 1 sets - 10 reps - Seated Knee Extension AAROM  - 2-3 x daily - 7 x weekly - 1 sets - 10 reps - Seated Quad Set  - 2-3 x daily - 7 x weekly - 1 sets - 10 reps  ASSESSMENT:  CLINICAL IMPRESSION: Patient presents to PT reporting high levels of pain in her Lt knee, stating yesterday and today have been particularly challenging. She expresses some frustration with timeline of recovery and not being informed prior to surgery how long and painful the recovery would be. Encourage patient to continue with pain and swelling management techniques as well as home exercises to improve ROM and function. She wants to be able to get on the floor to pray. Knee flexion ROM continues to improve with 106 AROM. She would like to progress to harder exercises, including more standing, next session.      OBJECTIVE IMPAIRMENTS: Abnormal gait, decreased activity tolerance, decreased endurance, decreased mobility, difficulty walking, decreased ROM, decreased strength, increased edema, improper body mechanics, postural dysfunction, and pain.   ACTIVITY LIMITATIONS: carrying, lifting, bending, sitting, standing, squatting, sleeping, stairs, transfers, and locomotion  level  PARTICIPATION LIMITATIONS: meal prep, cleaning, laundry, driving, shopping, community activity, and occupation  PERSONAL FACTORS: Time since onset of injury/illness/exacerbation are also affecting patient's functional outcome.   REHAB POTENTIAL: Good  CLINICAL DECISION MAKING: Stable/uncomplicated  EVALUATION COMPLEXITY: Low   GOALS: Goals reviewed with patient? No  SHORT TERM GOALS: Target date: 06/02/2023 Pt will demonstrate appropriate understanding and performance of initially prescribed HEP in order to facilitate improved independence with management of symptoms.  Baseline: HEP provided on eval Goal status: INITIAL   2. Pt will score greater than or equal to 24 on FOTO in order to demonstrate improved perception of function due to symptoms.  Baseline: 1  Goal status: INITIAL   LONG TERM GOALS: Target date: 06/30/2023 Pt will score 47 or greater on FOTO in order to demonstrate improved perception of function due to symptoms.  Baseline: 1 Goal status: INITIAL  2.  Pt will demonstrate at least 0-110 degrees of knee AROM in order to facilitate improved tolerance to  functional movements such as walking/stairs.  Baseline: see ROM chart above Goal status: INITIAL  3.  Pt will demonstrate grossly symmetrical knee flex/extension MMT in order to facilitate improved functional strength.  Baseline: see MMT chart above Goal status: INITIAL  4.  Pt will be able to perform TUG in less than or equal to 14 sec with LRAD in order to indicate reduced risk of falling (cutoff score for fall risk 13.5 sec in community dwelling older adults per [facility] et al, 2000)  Baseline: 42 sec RW  Goal status: INITIAL    5. Pt will demonstrate appropriate performance of final prescribed HEP in order to facilitate improved self-management of symptoms post-discharge.   Baseline: initial HEP prescribed  Goal status: INITIAL     PLAN:  PT FREQUENCY: 2x/week  PT DURATION: 8 weeks  PLANNED  INTERVENTIONS: Therapeutic exercises, Therapeutic activity, Neuromuscular re-education, Balance training, Gait training, Patient/Family education, Self Care, Joint mobilization, Stair training, Aquatic Therapy, Dry Needling, Electrical stimulation, Cryotherapy, Moist heat, scar mobilization, Taping, Vasopneumatic device, Manual therapy, and Re-evaluation  PLAN FOR NEXT SESSION: Review/update HEP PRN. Knee ROM; gait training (weight shifts), sit to stands, quad strengthening.   Berta Minor PTA 05/26/23 11:20 AM

## 2023-05-31 ENCOUNTER — Ambulatory Visit: Payer: PRIVATE HEALTH INSURANCE | Attending: Orthopaedic Surgery | Admitting: Physical Therapy

## 2023-05-31 ENCOUNTER — Encounter: Payer: Self-pay | Admitting: Physical Therapy

## 2023-05-31 DIAGNOSIS — R600 Localized edema: Secondary | ICD-10-CM | POA: Insufficient documentation

## 2023-05-31 DIAGNOSIS — M25662 Stiffness of left knee, not elsewhere classified: Secondary | ICD-10-CM | POA: Insufficient documentation

## 2023-05-31 DIAGNOSIS — R2689 Other abnormalities of gait and mobility: Secondary | ICD-10-CM | POA: Diagnosis present

## 2023-05-31 DIAGNOSIS — M6281 Muscle weakness (generalized): Secondary | ICD-10-CM | POA: Diagnosis present

## 2023-05-31 DIAGNOSIS — M25562 Pain in left knee: Secondary | ICD-10-CM | POA: Insufficient documentation

## 2023-05-31 NOTE — Therapy (Signed)
OUTPATIENT PHYSICAL THERAPY LOWER EXTREMITY TREATMENT   Patient Name: Cheryl Bush MRN: [id number] DOB:08-21-1966, 57 y.o., female Today's Date: 05/31/2023  END OF SESSION:  PT End of Session - 05/31/23 0932     Visit Number 6    Number of Visits 17    Date for PT Re-Evaluation 06/30/23    Authorization Type [employer]    Authorization Time Period no auth til visit 40 per appt notes    PT Start Time 0932    PT Stop Time 1020    PT Time Calculation (min) 48 min               Past Medical History:  Diagnosis Date   High cholesterol    Inverted nipple left   abcess removed from areola, causing some scarring and inverted nipple   Past Surgical History:  Procedure Laterality Date   KNEE SURGERY     left- arthroscopy   TOTAL KNEE ARTHROPLASTY Left 04/23/2023   Procedure: LEFT TOTAL KNEE ARTHROPLASTY;  Surgeon: Tarry Kos, MD;  Location: [facility] OR;  Service: Orthopedics;  Laterality: Left;   Patient Active Problem List   Diagnosis Date Noted   Status post total left knee replacement 04/23/2023   Primary osteoarthritis of right knee 07/28/2022   Primary osteoarthritis of left knee 07/28/2022   Leukopenia 09/07/2016   Mastitis-left, medial aspect 03/15/2013    PCP: Norm Salt, PA  REFERRING PROVIDER: Tarry Kos, MD  REFERRING DIAG: [phone] (ICD-10-CM) - S/P total knee arthroplasty, left  THERAPY DIAG:  Left knee pain, unspecified chronicity  Muscle weakness (generalized)  Rationale for Evaluation and Treatment: Rehabilitation  ONSET DATE: 04/23/23 L TKA   SUBJECTIVE:   SUBJECTIVE STATEMENT: Pt reports her pain feels similar to the pain she had before surgery. She feels sharp pain at the inside of her knee.    PERTINENT HISTORY: L TKA 04/23/23 PAIN:  Are you having pain: 9/10 Location/description: Lt anterior knee /medial  - aggravating factors: standing/walking, typical household activities, difficulty sleeping  - Easing factors: icing, rest     PRECAUTIONS: None  WEIGHT BEARING RESTRICTIONS: No  FALLS:  Has patient fallen in last 6 months? No  LIVING ENVIRONMENT: 4 steps to enter home no rail, 1 level. Lives with her husband and her son (75 y.o). Pt typically does majority of housework.   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 Has to do lifting, variable weights  PLOF: Independent  PATIENT GOALS: get back to normal   NEXT MD VISIT: Friday (July 12), Aug 17  OBJECTIVE:   DIAGNOSTIC FINDINGS:  S/p L TKA 04/23/23 Unremarkable post op DVT work up  PATIENT SURVEYS:  FOTO 1 current, 47 predicted  COGNITION: Overall cognitive status: Within functional limits for tasks assessed     SENSATION: Denies sensory complaints  EDEMA:  Gross edema about L knee joint and bruising apparent - formal exam deferred given compression stocking/pt attire  MUSCLE LENGTH: deferred  POSTURE: tendency to hold knee in slightly bent position, anteriorly placed while sitting  PALPATION: Gross post surgical soreness as expected  LOWER EXTREMITY ROM:     Active  Right eval Left eval Left 05/13/23 05/17/23 05/24/23 05/26/23   Hip flexion         Hip extension         Hip internal rotation         Hip external rotation  Knee extension [facility] Lacking 5 deg        Knee flexion WFL 40 deg active supine   60 deg active seated  87 deg passive seated A 96 supine Left:90 Left: 100  106 100  (Blank rows = not tested) (Key: WFL = within functional limits not formally assessed, * = concordant pain, s = stiffness/stretching sensation, NT = not tested)  Comments:    LOWER EXTREMITY MMT:    MMT Right eval Left eval  Hip flexion    Hip abduction (modified sitting)    Hip internal rotation    Hip external rotation    Knee flexion 5   Knee extension 5   Ankle dorsiflexion     (Blank rows = not tested) (Key: WFL = within functional limits not formally assessed, * =  concordant pain, s = stiffness/stretching sensation, NT = not tested)  Comments: LLE NT due to pain    FUNCTIONAL TESTS:   TUG 42 sec with RW   GAIT: Distance walked: within clinic Assistive device utilized: Environmental consultant - 2 wheeled Level of assistance: Modified independence Comments: step to pattern, discontinuous gait, reduced WB through surgical limb   TODAY'S TREATMENT:     [facility] Adult PT Treatment:                                                DATE: 05/31/23 Therapeutic Exercise: Seated knee ext  Seated heel slide Supine SLR x 10- min quad lag Supine SAQ x 10 Supine heel slide with strap Supine Bridge x 10 Nustep L1 UE/LE x 5 minutes  Manual Therapy: Retro massage with legs elevated  Modalities: Game ready x 10 minutes Lt knee, 38 degrees     [facility] Adult PT Treatment:                                                DATE: 05/26/23 Therapeutic Exercise: Supine heel slides x 10; 5 sec hold  Quad sets x 10; 5 sec hold  SLR partial range x10, x5 Sit to stand raised height no UE support 1 x 10 - cues for weight shift Step tap with weight shift 4" x10 Knee flexion stretch 10" stacked steps x10 Manual Therapy: Lt knee PROM to tolerance flexion/extension  Modalities: Game ready x 10 minutes Lt knee, 38 degrees  Self Care: Benefits of elevation, icing, HEP, discussion of recovery timeline                                                                                                                            [facility] Adult PT Treatment:  DATE: 05/24/23 Therapeutic Exercise: Supine heel slides x 10; 5 sec hold  Quad sets x 10; 5 sec hold  SLR partial range 2 x 5  LAQ 2 x 10  Sit to stand raised height no UE support 1 x 10  Standing weight shifts lateral 2 x 10  Manual Therapy: Lt knee PROM to tolerance flexion/extension   Modalities: Game ready x 10 minutes Lt knee, 38 degrees     [facility] Adult PT Treatment:                                                 DATE: 05/17/23 Therapeutic Exercise: Supine heel slides x 10  Quad set x 10; 5 sec hold  Seated march 2 x 10  SLR attempted unable  Seated march 2 x 10  LAQ 2 x 10  Seated knee flexion AAROM x 10; 5 sec hold  Ankle pumps x 20  Updated HEP   Manual Therapy: Lt knee PROM to tolerance flexion/extension   Therapeutic Activity: Sit to supine transfer with use of RLE to support LLE  Self Care: Benefits of active movement vs. relying upon CPM for ROM.   Modalities: Game ready x 10 min low pressure,coldest setting   PATIENT EDUCATION:  Education details: HEP review Person educated: Patient and spouse Education method: Explanation Education comprehension: verbalized understanding  HOME EXERCISE PROGRAM: Access Code: [email or web address] URL: [email or web address] Date: 05/05/2023 Prepared by: Fransisco Hertz  Exercises - Seated Knee Flexion AAROM  - 2-3 x daily - 7 x weekly - 1 sets - 10 reps - Seated Knee Extension AAROM  - 2-3 x daily - 7 x weekly - 1 sets - 10 reps - Seated Quad Set  - 2-3 x daily - 7 x weekly - 1 sets - 10 reps  ASSESSMENT:  CLINICAL IMPRESSION: Patient presents to PT reporting high levels of pain in her Lt knee, stating yesterday and today have been particularly challenging with stabbing pain at medial knee. She did add increased time to her normal walk which may have contributed. Able to begin Nustep today with good tolerance. Performed retro massage to educate and decrease edema. She will see MD this week to discuss her concerns with pain and swelling. She could be late to her next appt.        OBJECTIVE IMPAIRMENTS: Abnormal gait, decreased activity tolerance, decreased endurance, decreased mobility, difficulty walking, decreased ROM, decreased strength, increased edema, improper body mechanics, postural dysfunction, and pain.   ACTIVITY LIMITATIONS: carrying, lifting, bending, sitting, standing, squatting, sleeping, stairs,  transfers, and locomotion level  PARTICIPATION LIMITATIONS: meal prep, cleaning, laundry, driving, shopping, community activity, and occupation  PERSONAL FACTORS: Time since onset of injury/illness/exacerbation are also affecting patient's functional outcome.   REHAB POTENTIAL: Good  CLINICAL DECISION MAKING: Stable/uncomplicated  EVALUATION COMPLEXITY: Low   GOALS: Goals reviewed with patient? No  SHORT TERM GOALS: Target date: 06/02/2023 Pt will demonstrate appropriate understanding and performance of initially prescribed HEP in order to facilitate improved independence with management of symptoms.  Baseline: HEP provided on eval Goal status: INITIAL   2. Pt will score greater than or equal to 24 on FOTO in order to demonstrate improved perception of function due to symptoms.  Baseline: 1  Goal status: INITIAL   LONG TERM GOALS: Target date: 06/30/2023 Pt will score 47 or greater on FOTO in order  to demonstrate improved perception of function due to symptoms.  Baseline: 1 Goal status: INITIAL  2.  Pt will demonstrate at least 0-110 degrees of knee AROM in order to facilitate improved tolerance to functional movements such as walking/stairs.  Baseline: see ROM chart above Goal status: INITIAL  3.  Pt will demonstrate grossly symmetrical knee flex/extension MMT in order to facilitate improved functional strength.  Baseline: see MMT chart above Goal status: INITIAL  4.  Pt will be able to perform TUG in less than or equal to 14 sec with LRAD in order to indicate reduced risk of falling (cutoff score for fall risk 13.5 sec in community dwelling older adults per [facility] et al, 2000)  Baseline: 42 sec RW  Goal status: INITIAL    5. Pt will demonstrate appropriate performance of final prescribed HEP in order to facilitate improved self-management of symptoms post-discharge.   Baseline: initial HEP prescribed  Goal status: INITIAL     PLAN:  PT FREQUENCY: 2x/week  PT  DURATION: 8 weeks  PLANNED INTERVENTIONS: Therapeutic exercises, Therapeutic activity, Neuromuscular re-education, Balance training, Gait training, Patient/Family education, Self Care, Joint mobilization, Stair training, Aquatic Therapy, Dry Needling, Electrical stimulation, Cryotherapy, Moist heat, scar mobilization, Taping, Vasopneumatic device, Manual therapy, and Re-evaluation  PLAN FOR NEXT SESSION: How was MD appt? Review/update HEP PRN. Knee ROM; gait training (weight shifts), sit to stands, quad strengthening.   Anderson Malta Teruko Joswick PTA 05/31/23 3:24 PM

## 2023-06-01 ENCOUNTER — Encounter: Payer: PRIVATE HEALTH INSURANCE | Admitting: Orthopaedic Surgery

## 2023-06-02 ENCOUNTER — Ambulatory Visit (INDEPENDENT_AMBULATORY_CARE_PROVIDER_SITE_OTHER): Payer: PRIVATE HEALTH INSURANCE | Admitting: Orthopaedic Surgery

## 2023-06-02 ENCOUNTER — Ambulatory Visit (INDEPENDENT_AMBULATORY_CARE_PROVIDER_SITE_OTHER): Payer: PRIVATE HEALTH INSURANCE

## 2023-06-02 ENCOUNTER — Ambulatory Visit: Payer: PRIVATE HEALTH INSURANCE | Admitting: Physical Therapy

## 2023-06-02 ENCOUNTER — Encounter: Payer: Self-pay | Admitting: Orthopaedic Surgery

## 2023-06-02 ENCOUNTER — Encounter: Payer: Self-pay | Admitting: Physical Therapy

## 2023-06-02 DIAGNOSIS — Z96652 Presence of left artificial knee joint: Secondary | ICD-10-CM | POA: Diagnosis not present

## 2023-06-02 DIAGNOSIS — M6281 Muscle weakness (generalized): Secondary | ICD-10-CM

## 2023-06-02 DIAGNOSIS — M25562 Pain in left knee: Secondary | ICD-10-CM

## 2023-06-02 MED ORDER — CELECOXIB 200 MG PO CAPS: 200.0000 mg | ORAL_CAPSULE | Freq: Two times a day (BID) | ORAL | 3 refills | Status: DC

## 2023-06-02 NOTE — Progress Notes (Signed)
   Post-Op Visit Note   Patient: Cheryl Bush           Date of Birth: 07-Dec-1965           MRN: [id number] Visit Date: 06/02/2023 PCP: Norm Salt, PA   Assessment & Plan:  Chief Complaint:  Chief Complaint  Patient presents with   Left Knee - Follow-up    Left TKA 04/23/2023   Visit Diagnoses:  1. Status post total left knee replacement     Plan: Patient is now 6 weeks postop from a left total knee replacement.  Her main complaint is swelling.  She is progressing well through physical therapy.  Examination left knee shows a fully healed surgical scar.  She has moderate diffuse swelling of the lower extremity.  Negative DVT signs.  Range of motion is progressing well.  Ambulating with a walker although she can ambulate without 1.  The x-rays demonstrate a stable press-fit total knee replacement without any loosening or subsidence.  Overall I am happy with her progress but she does need to be out of work until she sees me back again in about 6 weeks.  Instructions reviewed.  Dental prophylaxis reinforced.  Celebrex prescribed for the inflammation.  Ace bandages provided to help with compression.  Follow-Up Instructions: Return in about 6 weeks (around 07/14/2023).   Orders:  Orders Placed This Encounter  Procedures   XR Knee 1-2 Views Left   Meds ordered this encounter  Medications   celecoxib (CELEBREX) 200 MG capsule    Sig: Take 1 capsule (200 mg total) by mouth 2 (two) times daily.    Dispense:  30 capsule    Refill:  3    Imaging: XR Knee 1-2 Views Left  Result Date: 06/02/2023 Stable left total knee replacement in good alignment.    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   Mastitis-left, medial aspect 03/15/2013   Past Medical History:  Diagnosis Date    High cholesterol    Inverted nipple left   abcess removed from areola, causing some scarring and inverted nipple    Family History  Problem Relation Age of Onset   Heart disease Brother     Past Surgical History:  Procedure Laterality Date   KNEE SURGERY     left- arthroscopy   TOTAL KNEE ARTHROPLASTY Left 04/23/2023   Procedure: LEFT TOTAL KNEE ARTHROPLASTY;  Surgeon: Tarry Kos, MD;  Location: [facility] OR;  Service: Orthopedics;  Laterality: Left;   Social History   Occupational History   Not on file  Tobacco Use   Smoking status: Never   Smokeless tobacco: Never  Vaping Use   Vaping status: Never Used  Substance and Sexual Activity   Alcohol use: No   Drug use: No   Sexual activity: Never

## 2023-06-02 NOTE — Therapy (Signed)
OUTPATIENT PHYSICAL THERAPY LOWER EXTREMITY TREATMENT   Patient Name: Cheryl Bush MRN: [id number] DOB:1966/09/12, 57 y.o., female Today's Date: 06/02/2023  END OF SESSION:  PT End of Session - 06/02/23 1108     Visit Number 7    Number of Visits 17    Date for PT Re-Evaluation 06/30/23    Authorization Type [employer]    Authorization Time Period no auth til visit 40 per appt notes    PT Start Time 1105    PT Stop Time 1155    PT Time Calculation (min) 50 min               Past Medical History:  Diagnosis Date   High cholesterol    Inverted nipple left   abcess removed from areola, causing some scarring and inverted nipple   Past Surgical History:  Procedure Laterality Date   KNEE SURGERY     left- arthroscopy   TOTAL KNEE ARTHROPLASTY Left 04/23/2023   Procedure: LEFT TOTAL KNEE ARTHROPLASTY;  Surgeon: Tarry Kos, MD;  Location: [facility] OR;  Service: Orthopedics;  Laterality: Left;   Patient Active Problem List   Diagnosis Date Noted   Status post total left knee replacement 04/23/2023   Primary osteoarthritis of right knee 07/28/2022   Primary osteoarthritis of left knee 07/28/2022   Leukopenia 09/07/2016   Mastitis-left, medial aspect 03/15/2013    PCP: Norm Salt, PA  REFERRING PROVIDER: Tarry Kos, MD  REFERRING DIAG: [id number] (ICD-10-CM) - S/P total knee arthroplasty, left  THERAPY DIAG:  Left knee pain, unspecified chronicity  Muscle weakness (generalized)  Rationale for Evaluation and Treatment: Rehabilitation  ONSET DATE: 04/23/23 L TKA   SUBJECTIVE:   SUBJECTIVE STATEMENT: Pt just saw her surgeon for follow up this morning. She was told her post op knee xray looks good and was given celebrex for inflammation. Her pain level is 8/10 on arrival    PERTINENT HISTORY: L TKA 04/23/23 PAIN:  Are you having pain: 8/10 Location/description: Lt anterior knee /medial  - aggravating factors: standing/walking, typical household activities,  difficulty sleeping  - Easing factors: icing, rest    PRECAUTIONS: None  WEIGHT BEARING RESTRICTIONS: No  FALLS:  Has patient fallen in last 6 months? No  LIVING ENVIRONMENT: 4 steps to enter home no rail, 1 level. Lives with her husband and her son (68 y.o). Pt typically does majority of housework.   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 Has to do lifting, variable weights  PLOF: Independent  PATIENT GOALS: get back to normal   NEXT MD VISIT: Friday (July 12), Aug 17  OBJECTIVE:   DIAGNOSTIC FINDINGS:  S/p L TKA 04/23/23 Unremarkable post op DVT work up  PATIENT SURVEYS:  FOTO 1 current, 47 predicted  COGNITION: Overall cognitive status: Within functional limits for tasks assessed     SENSATION: Denies sensory complaints  EDEMA:  Gross edema about L knee joint and bruising apparent - formal exam deferred given compression stocking/pt attire  MUSCLE LENGTH: deferred  POSTURE: tendency to hold knee in slightly bent position, anteriorly placed while sitting  PALPATION: Gross post surgical soreness as expected  LOWER EXTREMITY ROM:     Active  Right eval Left eval Left 05/13/23 05/17/23 05/24/23 05/26/23 05/31/23  Hip flexion         Hip extension         Hip internal rotation  Hip external rotation         Knee extension [facility] Lacking 5 deg        Knee flexion WFL 40 deg active supine   60 deg active seated  87 deg passive seated A 96 supine Left:90 Left: 100  106 100  (Blank rows = not tested) (Key: WFL = within functional limits not formally assessed, * = concordant pain, s = stiffness/stretching sensation, NT = not tested)  Comments:    LOWER EXTREMITY MMT:    MMT Right eval Left eval  Hip flexion    Hip abduction (modified sitting)    Hip internal rotation    Hip external rotation    Knee flexion 5   Knee extension 5   Ankle dorsiflexion     (Blank rows = not tested) (Key: WFL =  within functional limits not formally assessed, * = concordant pain, s = stiffness/stretching sensation, NT = not tested)  Comments: LLE NT due to pain    FUNCTIONAL TESTS:   TUG 42 sec with RW   GAIT: Distance walked: within clinic Assistive device utilized: Environmental consultant - 2 wheeled Level of assistance: Modified independence Comments: step to pattern, discontinuous gait, reduced WB through surgical limb   TODAY'S TREATMENT:     [facility] Adult PT Treatment:                                                DATE: 06/02/23 Therapeutic Exercise: Nustep L2 UE/LE x 5 minutes  LAQ 5# x 10 LAQ green  band x 10 +HEP Seated h/s curls green band  x10, and x 10 with husband assist+ HEP 4 inch forward step up bilat UE on bar- difficulty with TKE TKE standing blue band + HEP Updated HEP issued  Therapeutic Activity: Gait with SPC 85 feet CGA Modalities: Game ready x 10 minutes Lt knee, 38 degrees    [facility] Adult PT Treatment:                                                DATE: 05/31/23 Therapeutic Exercise: Seated knee ext  Seated heel slide Supine SLR x 10- min quad lag Supine SAQ x 10 Supine heel slide with strap Supine Bridge x 10 Nustep L1 UE/LE x 5 minutes  Manual Therapy: Retro massage with legs elevated  Modalities: Game ready x 10 minutes Lt knee, 38 degrees     [facility] Adult PT Treatment:                                                DATE: 05/26/23 Therapeutic Exercise: Supine heel slides x 10; 5 sec hold  Quad sets x 10; 5 sec hold  SLR partial range x10, x5 Sit to stand raised height no UE support 1 x 10 - cues for weight shift Step tap with weight shift 4" x10 Knee flexion stretch 10" stacked steps x10 Manual Therapy: Lt knee PROM to tolerance flexion/extension  Modalities: Game ready x 10 minutes Lt knee, 38 degrees  Self Care: Benefits of elevation, icing, HEP, discussion of recovery timeline  [facility] Adult PT Treatment:                                                DATE: 05/24/23 Therapeutic Exercise: Supine heel slides x 10; 5 sec hold  Quad sets x 10; 5 sec hold  SLR partial range 2 x 5  LAQ 2 x 10  Sit to stand raised height no UE support 1 x 10  Standing weight shifts lateral 2 x 10  Manual Therapy: Lt knee PROM to tolerance flexion/extension   Modalities: Game ready x 10 minutes Lt knee, 38 degrees        PATIENT EDUCATION:  Education details: HEP review Person educated: Patient and spouse Education method: Explanation Education comprehension: verbalized understanding  HOME EXERCISE PROGRAM: Access Code: [email or web address] URL: [email or web address] Date: 05/05/2023 Prepared by: Fransisco Hertz  Exercises - Seated Knee Flexion AAROM  - 2-3 x daily - 7 x weekly - 1 sets - 10 reps - Seated Knee Extension AAROM  - 2-3 x daily - 7 x weekly - 1 sets - 10 reps - Seated Quad Set  - 2-3 x daily - 7 x weekly - 1 sets - 10 reps  ASSESSMENT:  CLINICAL IMPRESSION: Patient presents to PT after leaving surgeon's appointment for 6 week follow up L TKA.  She was told that her xray looked good and was given meds for inflammation. Her pain level is 8/10 on arrival. She continues with RW. Trial of SPC in clinic which she did well with, no safety concerns, encouraged in home use. Added theraband resistance to knee flex, ext and TKE for HEP. With forward step up she has difficulty with TKE.   OBJECTIVE IMPAIRMENTS: Abnormal gait, decreased activity tolerance, decreased endurance, decreased mobility, difficulty walking, decreased ROM, decreased strength, increased edema, improper body mechanics, postural dysfunction, and pain.   ACTIVITY LIMITATIONS: carrying, lifting, bending, sitting, standing, squatting, sleeping, stairs, transfers, and locomotion level  PARTICIPATION LIMITATIONS: meal prep, cleaning, laundry, driving, shopping,  community activity, and occupation  PERSONAL FACTORS: Time since onset of injury/illness/exacerbation are also affecting patient's functional outcome.   REHAB POTENTIAL: Good  CLINICAL DECISION MAKING: Stable/uncomplicated  EVALUATION COMPLEXITY: Low   GOALS: Goals reviewed with patient? No  SHORT TERM GOALS: Target date: 06/02/2023 Pt will demonstrate appropriate understanding and performance of initially prescribed HEP in order to facilitate improved independence with management of symptoms.  Baseline: HEP provided on eval 06/02/23: demonstrates understanding Goal status: MET  2. Pt will score greater than or equal to 24 on FOTO in order to demonstrate improved perception of function due to symptoms.  Baseline: 1  Goal status: ONGOING  LONG TERM GOALS: Target date: 06/30/2023 Pt will score 47 or greater on FOTO in order to demonstrate improved perception of function due to symptoms.  Baseline: 1 Goal status: INITIAL  2.  Pt will demonstrate at least 0-110 degrees of knee AROM in order to facilitate improved tolerance to functional movements such as walking/stairs.  Baseline: see ROM chart above Goal status: INITIAL  3.  Pt will demonstrate grossly symmetrical knee flex/extension MMT in order to facilitate improved functional strength.  Baseline: see MMT chart above Goal status: INITIAL  4.  Pt will be able to perform TUG in less than or equal to 14 sec with LRAD in order to indicate reduced risk of falling (cutoff score for fall risk  13.5 sec in community dwelling older adults per [facility] et al, 2000)  Baseline: 42 sec RW  Goal status: INITIAL    5. Pt will demonstrate appropriate performance of final prescribed HEP in order to facilitate improved self-management of symptoms post-discharge.   Baseline: initial HEP prescribed  Goal status: INITIAL     PLAN:  PT FREQUENCY: 2x/week  PT DURATION: 8 weeks  PLANNED INTERVENTIONS: Therapeutic exercises, Therapeutic  activity, Neuromuscular re-education, Balance training, Gait training, Patient/Family education, Self Care, Joint mobilization, Stair training, Aquatic Therapy, Dry Needling, Electrical stimulation, Cryotherapy, Moist heat, scar mobilization, Taping, Vasopneumatic device, Manual therapy, and Re-evaluation  PLAN FOR NEXT SESSION: How was MD appt? Review/update HEP PRN. Knee ROM; gait training (weight shifts), sit to stands, quad strengthening. NEEDS FOTO status  Royden Purl PTA 06/02/23 11:52 AM

## 2023-06-07 ENCOUNTER — Ambulatory Visit: Payer: PRIVATE HEALTH INSURANCE

## 2023-06-07 DIAGNOSIS — R2689 Other abnormalities of gait and mobility: Secondary | ICD-10-CM

## 2023-06-07 DIAGNOSIS — M25562 Pain in left knee: Secondary | ICD-10-CM

## 2023-06-07 DIAGNOSIS — M25662 Stiffness of left knee, not elsewhere classified: Secondary | ICD-10-CM

## 2023-06-07 DIAGNOSIS — R600 Localized edema: Secondary | ICD-10-CM

## 2023-06-07 DIAGNOSIS — M6281 Muscle weakness (generalized): Secondary | ICD-10-CM

## 2023-06-07 NOTE — Therapy (Signed)
OUTPATIENT PHYSICAL THERAPY LOWER EXTREMITY TREATMENT   Patient Name: Cheryl Bush MRN: [id number] DOB:28-Aug-1966, 57 y.o., female Today's Date: 06/07/2023  END OF SESSION:  PT End of Session - 06/07/23 1058     Visit Number 8    Number of Visits 17    Date for PT Re-Evaluation 06/30/23    Authorization Type [employer]    Authorization Time Period no auth til visit 40 per appt notes    PT Start Time 1058    PT Stop Time 1152    PT Time Calculation (min) 54 min                Past Medical History:  Diagnosis Date   High cholesterol    Inverted nipple left   abcess removed from areola, causing some scarring and inverted nipple   Past Surgical History:  Procedure Laterality Date   KNEE SURGERY     left- arthroscopy   TOTAL KNEE ARTHROPLASTY Left 04/23/2023   Procedure: LEFT TOTAL KNEE ARTHROPLASTY;  Surgeon: Tarry Kos, MD;  Location: [facility] OR;  Service: Orthopedics;  Laterality: Left;   Patient Active Problem List   Diagnosis Date Noted   Status post total left knee replacement 04/23/2023   Primary osteoarthritis of right knee 07/28/2022   Primary osteoarthritis of left knee 07/28/2022   Leukopenia 09/07/2016   Mastitis-left, medial aspect 03/15/2013    PCP: Norm Salt, PA  REFERRING PROVIDER: Tarry Kos, MD  REFERRING DIAG: [phone] (ICD-10-CM) - S/P total knee arthroplasty, left  THERAPY DIAG:  Left knee pain, unspecified chronicity  Muscle weakness (generalized)  Stiffness of left knee, not elsewhere classified  Localized edema  Other abnormalities of gait and mobility  Rationale for Evaluation and Treatment: Rehabilitation  ONSET DATE: 04/23/23 L TKA   SUBJECTIVE:   SUBJECTIVE STATEMENT: "It is better." She has been using her SPC for the past 2 days.    PERTINENT HISTORY: L TKA 04/23/23 PAIN:  Are you having pain: 7/10 Location/description: Lt anterior knee /medial  - aggravating factors: standing/walking, typical household  activities, difficulty sleeping  - Easing factors: icing, rest    PRECAUTIONS: None  WEIGHT BEARING RESTRICTIONS: No  FALLS:  Has patient fallen in last 6 months? No  LIVING ENVIRONMENT: 4 steps to enter home no rail, 1 level. Lives with her husband and her son (11 y.o). Pt typically does majority of housework.   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 Has to do lifting, variable weights  PLOF: Independent  PATIENT GOALS: get back to normal   NEXT MD VISIT: Friday (July 12), Aug 17  OBJECTIVE:   DIAGNOSTIC FINDINGS:  S/p L TKA 04/23/23 Unremarkable post op DVT work up  PATIENT SURVEYS:  FOTO 1 current, 47 predicted 06/07/23: 25% function   COGNITION: Overall cognitive status: Within functional limits for tasks assessed     SENSATION: Denies sensory complaints  EDEMA:  Gross edema about L knee joint and bruising apparent - formal exam deferred given compression stocking/pt attire  MUSCLE LENGTH: deferred  POSTURE: tendency to hold knee in slightly bent position, anteriorly placed while sitting  PALPATION: Gross post surgical soreness as expected  LOWER EXTREMITY ROM:     Active  Right eval Left eval Left 05/13/23 05/17/23 05/24/23 05/26/23 05/31/23  Hip flexion         Hip extension         Hip internal rotation  Hip external rotation         Knee extension [facility] Lacking 5 deg        Knee flexion WFL 40 deg active supine   60 deg active seated  87 deg passive seated A 96 supine Left:90 Left: 100  106 100  (Blank rows = not tested) (Key: WFL = within functional limits not formally assessed, * = concordant pain, s = stiffness/stretching sensation, NT = not tested)  Comments:    LOWER EXTREMITY MMT:    MMT Right eval Left eval  Hip flexion    Hip abduction (modified sitting)    Hip internal rotation    Hip external rotation    Knee flexion 5   Knee extension 5   Ankle dorsiflexion      (Blank rows = not tested) (Key: WFL = within functional limits not formally assessed, * = concordant pain, s = stiffness/stretching sensation, NT = not tested)  Comments: LLE NT due to pain    FUNCTIONAL TESTS:   TUG 42 sec with RW   GAIT: Distance walked: within clinic Assistive device utilized: Environmental consultant - 2 wheeled Level of assistance: Modified independence Comments: step to pattern, discontinuous gait, reduced WB through surgical limb   TODAY'S TREATMENT:     [facility] Adult PT Treatment:                                                DATE: 06/07/23 Therapeutic Exercise: NuStep level 4 x 5 minutes UE/LE  DL calf raise x 10  SL calf raise LLE x 10   Gait training: Pre-gait: forward step overs half foam roller working on heel strike and knee extension x 10 each  With SPC focusing on sequencing with SPC and heel strike Modalities: Game ready x 10 minutes Lt knee, 38 degrees     [facility] Adult PT Treatment:                                                DATE: 06/02/23 Therapeutic Exercise: Nustep L2 UE/LE x 5 minutes  LAQ 5# x 10 LAQ green  band x 10 +HEP Seated h/s curls green band  x10, and x 10 with husband assist+ HEP 4 inch forward step up bilat UE on bar- difficulty with TKE TKE standing blue band + HEP Updated HEP issued  Therapeutic Activity: Gait with SPC 85 feet CGA Modalities: Game ready x 10 minutes Lt knee, 38 degrees    [facility] Adult PT Treatment:                                                DATE: 05/31/23 Therapeutic Exercise: Seated knee ext  Seated heel slide Supine SLR x 10- min quad lag Supine SAQ x 10 Supine heel slide with strap Supine Bridge x 10 Nustep L1 UE/LE x 5 minutes  Manual Therapy: Retro massage with legs elevated  Modalities: Game ready x 10 minutes Lt knee, 38 degrees     [facility] Adult PT Treatment:  DATE: 05/26/23 Therapeutic Exercise: Supine heel slides x 10; 5 sec hold  Quad sets x 10; 5  sec hold  SLR partial range x10, x5 Sit to stand raised height no UE support 1 x 10 - cues for weight shift Step tap with weight shift 4" x10 Knee flexion stretch 10" stacked steps x10 Manual Therapy: Lt knee PROM to tolerance flexion/extension  Modalities: Game ready x 10 minutes Lt knee, 38 degrees  Self Care: Benefits of elevation, icing, HEP, discussion of recovery timeline      PATIENT EDUCATION:  Education details: HEP review Person educated: Patient and spouse Education method: Explanation Education comprehension: verbalized understanding  HOME EXERCISE PROGRAM: Access Code: [email or web address] URL: [email or web address] Date: 05/05/2023 Prepared by: Fransisco Hertz  Exercises - Seated Knee Flexion AAROM  - 2-3 x daily - 7 x weekly - 1 sets - 10 reps - Seated Knee Extension AAROM  - 2-3 x daily - 7 x weekly - 1 sets - 10 reps - Seated Quad Set  - 2-3 x daily - 7 x weekly - 1 sets - 10 reps  ASSESSMENT:  CLINICAL IMPRESSION: Patient arrives with moderate Lt knee pain. Worked on gait training with her [facility] with patient requiring heavy cues initially for proper sequencing with SPC. With continued practice and cues she demonstrates proper step through pattern with SPC without signs of instability. She quickly fatigues with standing strengthening/gait training requiring frequent seated rest break. No increase in knee pain reported throughout session. Her FOTO score has significantly improved compared to baseline, having met this STG.   OBJECTIVE IMPAIRMENTS: Abnormal gait, decreased activity tolerance, decreased endurance, decreased mobility, difficulty walking, decreased ROM, decreased strength, increased edema, improper body mechanics, postural dysfunction, and pain.   ACTIVITY LIMITATIONS: carrying, lifting, bending, sitting, standing, squatting, sleeping, stairs, transfers, and locomotion level  PARTICIPATION LIMITATIONS: meal prep, cleaning, laundry, driving,  shopping, community activity, and occupation  PERSONAL FACTORS: Time since onset of injury/illness/exacerbation are also affecting patient's functional outcome.   REHAB POTENTIAL: Good  CLINICAL DECISION MAKING: Stable/uncomplicated  EVALUATION COMPLEXITY: Low   GOALS: Goals reviewed with patient? No  SHORT TERM GOALS: Target date: 06/02/2023 Pt will demonstrate appropriate understanding and performance of initially prescribed HEP in order to facilitate improved independence with management of symptoms.  Baseline: HEP provided on eval 06/02/23: demonstrates understanding Goal status: MET  2. Pt will score greater than or equal to 24 on FOTO in order to demonstrate improved perception of function due to symptoms.  Baseline: 1  Goal status: MET  LONG TERM GOALS: Target date: 06/30/2023 Pt will score 47 or greater on FOTO in order to demonstrate improved perception of function due to symptoms.  Baseline: 1 Goal status: INITIAL  2.  Pt will demonstrate at least 0-110 degrees of knee AROM in order to facilitate improved tolerance to functional movements such as walking/stairs.  Baseline: see ROM chart above Goal status: INITIAL  3.  Pt will demonstrate grossly symmetrical knee flex/extension MMT in order to facilitate improved functional strength.  Baseline: see MMT chart above Goal status: INITIAL  4.  Pt will be able to perform TUG in less than or equal to 14 sec with LRAD in order to indicate reduced risk of falling (cutoff score for fall risk 13.5 sec in community dwelling older adults per [facility] et al, 2000)  Baseline: 42 sec RW  Goal status: INITIAL    5. Pt will demonstrate appropriate performance of final prescribed HEP in order to facilitate improved self-management of  symptoms post-discharge.   Baseline: initial HEP prescribed  Goal status: INITIAL     PLAN:  PT FREQUENCY: 2x/week  PT DURATION: 8 weeks  PLANNED INTERVENTIONS: Therapeutic exercises, Therapeutic  activity, Neuromuscular re-education, Balance training, Gait training, Patient/Family education, Self Care, Joint mobilization, Stair training, Aquatic Therapy, Dry Needling, Electrical stimulation, Cryotherapy, Moist heat, scar mobilization, Taping, Vasopneumatic device, Manual therapy, and Re-evaluation  PLAN FOR NEXT SESSION: Review/update HEP PRN. Knee ROM; gait training  sit to stands, quad strengthening. Stair training   Letitia Libra, PT, DPT, ATC 06/07/23 11:55 AM

## 2023-06-09 ENCOUNTER — Ambulatory Visit: Payer: PRIVATE HEALTH INSURANCE | Admitting: Physical Therapy

## 2023-06-09 ENCOUNTER — Encounter: Payer: Self-pay | Admitting: Physical Therapy

## 2023-06-09 DIAGNOSIS — M25562 Pain in left knee: Secondary | ICD-10-CM | POA: Diagnosis not present

## 2023-06-09 DIAGNOSIS — M6281 Muscle weakness (generalized): Secondary | ICD-10-CM

## 2023-06-09 DIAGNOSIS — M25662 Stiffness of left knee, not elsewhere classified: Secondary | ICD-10-CM

## 2023-06-09 DIAGNOSIS — R600 Localized edema: Secondary | ICD-10-CM

## 2023-06-09 NOTE — Therapy (Signed)
OUTPATIENT PHYSICAL THERAPY LOWER EXTREMITY TREATMENT   Patient Name: Cheryl Bush MRN: [id number] DOB:06-19-1966, 57 y.o., female Today's Date: 06/09/2023  END OF SESSION:  PT End of Session - 06/09/23 1105     Visit Number 9    Number of Visits 17    Date for PT Re-Evaluation 06/30/23    Authorization Type [employer]    Authorization Time Period no auth til visit 40 per appt notes    PT Start Time 1103    PT Stop Time 1155    PT Time Calculation (min) 52 min                Past Medical History:  Diagnosis Date   High cholesterol    Inverted nipple left   abcess removed from areola, causing some scarring and inverted nipple   Past Surgical History:  Procedure Laterality Date   KNEE SURGERY     left- arthroscopy   TOTAL KNEE ARTHROPLASTY Left 04/23/2023   Procedure: LEFT TOTAL KNEE ARTHROPLASTY;  Surgeon: Tarry Kos, MD;  Location: [facility] OR;  Service: Orthopedics;  Laterality: Left;   Patient Active Problem List   Diagnosis Date Noted   Status post total left knee replacement 04/23/2023   Primary osteoarthritis of right knee 07/28/2022   Primary osteoarthritis of left knee 07/28/2022   Leukopenia 09/07/2016   Mastitis-left, medial aspect 03/15/2013    PCP: Norm Salt, PA  REFERRING PROVIDER: Tarry Kos, MD  REFERRING DIAG: [phone] (ICD-10-CM) - S/P total knee arthroplasty, left  THERAPY DIAG:  Left knee pain, unspecified chronicity  Stiffness of left knee, not elsewhere classified  Muscle weakness (generalized)  Localized edema  Rationale for Evaluation and Treatment: Rehabilitation  ONSET DATE: 04/23/23 L TKA   SUBJECTIVE:   SUBJECTIVE STATEMENT: "Pain is 8/10. I've been trying to use the cane. "    PERTINENT HISTORY: L TKA 04/23/23 PAIN:  Are you having pain: 8/10 Location/description: Lt anterior knee /medial  - aggravating factors: standing/walking, typical household activities, difficulty sleeping  - Easing factors: icing,  rest    PRECAUTIONS: None  WEIGHT BEARING RESTRICTIONS: No  FALLS:  Has patient fallen in last 6 months? No  LIVING ENVIRONMENT: 4 steps to enter home no rail, 1 level. Lives with her husband and her son (58 y.o). Pt typically does majority of housework.   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 Has to do lifting, variable weights  PLOF: Independent  PATIENT GOALS: get back to normal   NEXT MD VISIT: Friday (July 12), Aug 17  OBJECTIVE:   DIAGNOSTIC FINDINGS:  S/p L TKA 04/23/23 Unremarkable post op DVT work up  PATIENT SURVEYS:  FOTO 1 current, 47 predicted 06/07/23: 25% function   COGNITION: Overall cognitive status: Within functional limits for tasks assessed     SENSATION: Denies sensory complaints  EDEMA:  Gross edema about L knee joint and bruising apparent - formal exam deferred given compression stocking/pt attire  MUSCLE LENGTH: deferred  POSTURE: tendency to hold knee in slightly bent position, anteriorly placed while sitting  PALPATION: Gross post surgical soreness as expected  LOWER EXTREMITY ROM:     Active  Right eval Left eval Left 05/13/23 05/17/23 05/24/23 05/26/23 05/31/23 06/09/23  Hip flexion          Hip extension          Hip internal rotation          Hip external  rotation          Knee extension [facility] Lacking 5 deg         Knee flexion WFL 40 deg active supine   60 deg active seated  87 deg passive seated A 96 supine Left:90 Left: 100  106 100 113  (Blank rows = not tested) (Key: WFL = within functional limits not formally assessed, * = concordant pain, s = stiffness/stretching sensation, NT = not tested)  Comments:    LOWER EXTREMITY MMT:    MMT Right eval Left eval  Hip flexion    Hip abduction (modified sitting)    Hip internal rotation    Hip external rotation    Knee flexion 5   Knee extension 5   Ankle dorsiflexion     (Blank rows = not tested) (Key: WFL = within  functional limits not formally assessed, * = concordant pain, s = stiffness/stretching sensation, NT = not tested)  Comments: LLE NT due to pain    FUNCTIONAL TESTS:   TUG 42 sec with RW ; 06/09/23: 16.6 without AD  GAIT: Distance walked: within clinic Assistive device utilized: Environmental consultant - 2 wheeled Level of assistance: Modified independence Comments: step to pattern, discontinuous gait, reduced WB through surgical limb   TODAY'S TREATMENT:     [facility] Adult PT Treatment:                                                DATE: 06/09/23 Therapeutic Exercise: Rec Bike Low seat L2 x 5 minutes Hip flexor stretch EOM- added strap 10 sec x 5  Hamstring stretch supine with strap 10 sec x 5  SLR 2# x 10 6 inch step up RUE support and LUE SPC x 10 Seated Leg press SL 15# x 15  Tandem stance trials SLS trials  Bilateral heel raises   Therapeutic Activity: TUG  16.6 sec without AD  Modalities: Game ready x 10 minutes Lt knee, 38 degrees     [facility] Adult PT Treatment:                                                DATE: 06/07/23 Therapeutic Exercise: NuStep level 4 x 5 minutes UE/LE  DL calf raise x 10  SL calf raise LLE x 10   Gait training: Pre-gait: forward step overs half foam roller working on heel strike and knee extension x 10 each  With SPC focusing on sequencing with SPC and heel strike Modalities: Game ready x 10 minutes Lt knee, 38 degrees     [facility] Adult PT Treatment:                                                DATE: 06/02/23 Therapeutic Exercise: Nustep L2 UE/LE x 5 minutes  LAQ 5# x 10 LAQ green  band x 10 +HEP Seated h/s curls green band  x10, and x 10 with husband assist+ HEP 4 inch forward step up bilat UE on bar- difficulty with TKE TKE standing blue band + HEP Updated HEP issued  Therapeutic Activity: Gait with SPC  85 feet CGA Modalities: Game ready x 10 minutes Lt knee, 38 degrees    [facility] Adult PT Treatment:                                                 DATE: 05/31/23 Therapeutic Exercise: Seated knee ext  Seated heel slide Supine SLR x 10- min quad lag Supine SAQ x 10 Supine heel slide with strap Supine Bridge x 10 Nustep L1 UE/LE x 5 minutes  Manual Therapy: Retro massage with legs elevated  Modalities: Game ready x 10 minutes Lt knee, 38 degrees     [facility] Adult PT Treatment:                                                DATE: 05/26/23 Therapeutic Exercise: Supine heel slides x 10; 5 sec hold  Quad sets x 10; 5 sec hold  SLR partial range x10, x5 Sit to stand raised height no UE support 1 x 10 - cues for weight shift Step tap with weight shift 4" x10 Knee flexion stretch 10" stacked steps x10 Manual Therapy: Lt knee PROM to tolerance flexion/extension  Modalities: Game ready x 10 minutes Lt knee, 38 degrees  Self Care: Benefits of elevation, icing, HEP, discussion of recovery timeline      PATIENT EDUCATION:  Education details: HEP review Person educated: Patient and spouse Education method: Explanation Education comprehension: verbalized understanding  HOME EXERCISE PROGRAM: Access Code: [email or web address] URL: [email or web address] Date: 05/05/2023 Prepared by: Fransisco Hertz  Exercises - Seated Knee Flexion AAROM  - 2-3 x daily - 7 x weekly - 1 sets - 10 reps - Seated Knee Extension AAROM  - 2-3 x daily - 7 x weekly - 1 sets - 10 reps - Seated Quad Set  - 2-3 x daily - 7 x weekly - 1 sets - 10 reps  ASSESSMENT:  CLINICAL IMPRESSION: Patient arrives with 8/10 knee pain. She has improved PSC sequencing without cues. Her AROM knee flexion is improved. Her TUG score has improved, progressing toward LTGs. Improved forward step up mechanics but with instability with return down from step. Began static balance with good tolerance.  No increase in knee pain reported throughout session.    OBJECTIVE IMPAIRMENTS: Abnormal gait, decreased activity tolerance, decreased endurance, decreased mobility, difficulty  walking, decreased ROM, decreased strength, increased edema, improper body mechanics, postural dysfunction, and pain.   ACTIVITY LIMITATIONS: carrying, lifting, bending, sitting, standing, squatting, sleeping, stairs, transfers, and locomotion level  PARTICIPATION LIMITATIONS: meal prep, cleaning, laundry, driving, shopping, community activity, and occupation  PERSONAL FACTORS: Time since onset of injury/illness/exacerbation are also affecting patient's functional outcome.   REHAB POTENTIAL: Good  CLINICAL DECISION MAKING: Stable/uncomplicated  EVALUATION COMPLEXITY: Low   GOALS: Goals reviewed with patient? No  SHORT TERM GOALS: Target date: 06/02/2023 Pt will demonstrate appropriate understanding and performance of initially prescribed HEP in order to facilitate improved independence with management of symptoms.  Baseline: HEP provided on eval 06/02/23: demonstrates understanding Goal status: MET  2. Pt will score greater than or equal to 24 on FOTO in order to demonstrate improved perception of function due to symptoms.  Baseline: 1  Goal status: MET  LONG TERM GOALS: Target date: 06/30/2023 Pt  will score 47 or greater on FOTO in order to demonstrate improved perception of function due to symptoms.  Baseline: 1 Goal status: INITIAL  2.  Pt will demonstrate at least 0-110 degrees of knee AROM in order to facilitate improved tolerance to functional movements such as walking/stairs.  Baseline: see ROM chart above Goal status: INITIAL  3.  Pt will demonstrate grossly symmetrical knee flex/extension MMT in order to facilitate improved functional strength.  Baseline: see MMT chart above Goal status: INITIAL  4.  Pt will be able to perform TUG in less than or equal to 14 sec with LRAD in order to indicate reduced risk of falling (cutoff score for fall risk 13.5 sec in community dwelling older adults per [facility] et al, 2000)  Baseline: 42 sec RW  06/09/23: 16.6 without AD  Goal  status: ONGOING  5. Pt will demonstrate appropriate performance of final prescribed HEP in order to facilitate improved self-management of symptoms post-discharge.   Baseline: initial HEP prescribed  Goal status: INITIAL     PLAN:  PT FREQUENCY: 2x/week  PT DURATION: 8 weeks  PLANNED INTERVENTIONS: Therapeutic exercises, Therapeutic activity, Neuromuscular re-education, Balance training, Gait training, Patient/Family education, Self Care, Joint mobilization, Stair training, Aquatic Therapy, Dry Needling, Electrical stimulation, Cryotherapy, Moist heat, scar mobilization, Taping, Vasopneumatic device, Manual therapy, and Re-evaluation  PLAN FOR NEXT SESSION: Review/update HEP PRN. Knee ROM; gait training  sit to stands, quad strengthening. Stair training   Jannette Spanner, PTA 06/09/23 1:05 PM Phone: [phone] Fax: [phone]

## 2023-06-14 ENCOUNTER — Ambulatory Visit: Payer: PRIVATE HEALTH INSURANCE

## 2023-06-14 DIAGNOSIS — M6281 Muscle weakness (generalized): Secondary | ICD-10-CM

## 2023-06-14 DIAGNOSIS — M25662 Stiffness of left knee, not elsewhere classified: Secondary | ICD-10-CM

## 2023-06-14 DIAGNOSIS — M25562 Pain in left knee: Secondary | ICD-10-CM | POA: Diagnosis not present

## 2023-06-14 DIAGNOSIS — R2689 Other abnormalities of gait and mobility: Secondary | ICD-10-CM

## 2023-06-14 DIAGNOSIS — R600 Localized edema: Secondary | ICD-10-CM

## 2023-06-14 NOTE — Therapy (Signed)
OUTPATIENT PHYSICAL THERAPY LOWER EXTREMITY TREATMENT   Patient Name: Cheryl Bush MRN: [id number] DOB:August 21, 1966, 57 y.o.,, female Today's Date: 06/14/2023  END OF SESSION:  PT End of Session - 06/14/23 0845     Visit Number 10    Number of Visits 17    Date for PT Re-Evaluation 06/30/23    Authorization Type [employer]    Authorization Time Period no auth til visit 40 per appt notes    PT Start Time 0845    PT Stop Time 0936    PT Time Calculation (min) 51 min                Past Medical History:  Diagnosis Date   High cholesterol    Inverted nipple left   abcess removed from areola, causing some scarring and inverted nipple   Past Surgical History:  Procedure Laterality Date   KNEE SURGERY     left- arthroscopy   TOTAL KNEE ARTHROPLASTY Left 04/23/2023   Procedure: LEFT TOTAL KNEE ARTHROPLASTY;  Surgeon: Tarry Kos, MD;  Location: [facility] OR;  Service: Orthopedics;  Laterality: Left;   Patient Active Problem List   Diagnosis Date Noted   Status post total left knee replacement 04/23/2023   Primary osteoarthritis of right knee 07/28/2022   Primary osteoarthritis of left knee 07/28/2022   Leukopenia 09/07/2016   Mastitis-left, medial aspect 03/15/2013    PCP: Norm Salt, PA  REFERRING PROVIDER: Tarry Kos, MD  REFERRING DIAG: [phone] (ICD-10-CM) - S/P total knee arthroplasty, left  THERAPY DIAG:  Left knee pain, unspecified chronicity  Stiffness of left knee, not elsewhere classified  Muscle weakness (generalized)  Localized edema  Other abnormalities of gait and mobility  Rationale for Evaluation and Treatment: Rehabilitation  ONSET DATE: 04/23/23 L TKA   SUBJECTIVE:   SUBJECTIVE STATEMENT: "It is bad. I stood in the kitchen a lot yesterday, so it is hurting."   PERTINENT HISTORY: L TKA 04/23/23 PAIN:  Are you having pain: 9/10 Location/description: Lt anterior knee /medial  - aggravating factors: standing/walking, typical  household activities, difficulty sleeping  - Easing factors: icing, rest    PRECAUTIONS: None  WEIGHT BEARING RESTRICTIONS: No  FALLS:  Has patient fallen in last 6 months? No  LIVING ENVIRONMENT: 4 steps to enter home no rail, 1 level. Lives with her husband and her son (5 y.o). Pt typically does majority of housework.   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 Has to do lifting, variable weights  PLOF: Independent  PATIENT GOALS: get back to normal   NEXT MD VISIT: Friday (July 12), Aug 17  OBJECTIVE:   DIAGNOSTIC FINDINGS:  S/p L TKA 04/23/23 Unremarkable post op DVT work up  PATIENT SURVEYS:  FOTO 1 current, 47 predicted 06/07/23: 25% function   COGNITION: Overall cognitive status: Within functional limits for tasks assessed     SENSATION: Denies sensory complaints  EDEMA:  Gross edema about L knee joint and bruising apparent - formal exam deferred given compression stocking/pt attire  MUSCLE LENGTH: deferred  POSTURE: tendency to hold knee in slightly bent position, anteriorly placed while sitting  PALPATION: Gross post surgical soreness as expected  LOWER EXTREMITY ROM:     Active  Right eval Left eval Left 05/13/23 05/17/23 05/24/23 05/26/23 05/31/23 06/09/23  Hip flexion          Hip extension          Hip internal rotation  Hip external rotation          Knee extension [facility] Lacking 5 deg         Knee flexion WFL 40 deg active supine   60 deg active seated  87 deg passive seated A 96 supine Left:90 Left: 100  106 100 113  (Blank rows = not tested) (Key: WFL = within functional limits not formally assessed, * = concordant pain, s = stiffness/stretching sensation, NT = not tested)  Comments:    LOWER EXTREMITY MMT:    MMT Right eval Left eval  Hip flexion    Hip abduction (modified sitting)    Hip internal rotation    Hip external rotation    Knee flexion 5   Knee extension 5    Ankle dorsiflexion     (Blank rows = not tested) (Key: WFL = within functional limits not formally assessed, * = concordant pain, s = stiffness/stretching sensation, NT = not tested)  Comments: LLE NT due to pain    FUNCTIONAL TESTS:   TUG 42 sec with RW ; 06/09/23: 16.6 without AD  GAIT: Distance walked: within clinic Assistive device utilized: Environmental consultant - 2 wheeled Level of assistance: Modified independence Comments: step to pattern, discontinuous gait, reduced WB through surgical limb   TODAY'S TREATMENT:      [facility] Adult PT Treatment:                                                DATE: 06/14/23 Therapeutic Exercise: Recumbent bike no resistance full revolutions x 5 minutes  LAQ 2 x 10  Sit to stand 2 x 10 Step taps 8 inch step 2 x 10; RUE support  Step ups 4 inch and 6 inch 2 x 10; RUE support  Calf raises 2 x 10 Reviewed and updated HEP   Modalities: Game ready x 10 minutes Lt knee, 38 degrees    [facility] Adult PT Treatment:                                                DATE: 06/09/23 Therapeutic Exercise: Rec Bike Low seat L2 x 5 minutes Hip flexor stretch EOM- added strap 10 sec x 5  Hamstring stretch supine with strap 10 sec x 5  SLR 2# x 10 6 inch step up RUE support and LUE SPC x 10 Seated Leg press SL 15# x 15  Tandem stance trials SLS trials  Bilateral heel raises   Therapeutic Activity: TUG  16.6 sec without AD  Modalities: Game ready x 10 minutes Lt knee, 38 degrees     [facility] Adult PT Treatment:                                                DATE: 06/07/23 Therapeutic Exercise: NuStep level 4 x 5 minutes UE/LE  DL calf raise x 10  SL calf raise LLE x 10   Gait training: Pre-gait: forward step overs half foam roller working on heel strike and knee extension x 10 each  With SPC focusing on sequencing with SPC and heel strike Modalities:  Game ready x 10 minutes Lt knee, 38 degrees      PATIENT EDUCATION:  Education details: HEP update Person  educated: Patient and spouse Education method: Explanation, demo, cues, handout Education comprehension: verbalized understanding, returned demo, cues   HOME EXERCISE PROGRAM: Access Code: [email or web address] URL: [email or web address] Date: 05/05/2023 Prepared by: Fransisco Hertz  Exercises - Seated Knee Flexion AAROM  - 2-3 x daily - 7 x weekly - 1 sets - 10 reps - Seated Knee Extension AAROM  - 2-3 x daily - 7 x weekly - 1 sets - 10 reps - Seated Quad Set  - 2-3 x daily - 7 x weekly - 1 sets - 10 reps  ASSESSMENT:  CLINICAL IMPRESSION: Patient arrives with 9/10 knee pain, attributed to prolonged standing yesterday, though in NAD. Introduced recumbent bike today with patient able to complete full forward revolutions without resistance with ease. Continued with progression of knee strengthening with good tolerance. She demonstrates good form and control with sit to stand without UE support. Able to progress stepping activity with patient demonstrating good stability with 4 inch and 6 inch step up on the LLE with single UE support. HEP was updated to include further strengthening.   OBJECTIVE IMPAIRMENTS: Abnormal gait, decreased activity tolerance, decreased endurance, decreased mobility, difficulty walking, decreased ROM, decreased strength, increased edema, improper body mechanics, postural dysfunction, and pain.   ACTIVITY LIMITATIONS: carrying, lifting, bending, sitting, standing, squatting, sleeping, stairs, transfers, and locomotion level  PARTICIPATION LIMITATIONS: meal prep, cleaning, laundry, driving, shopping, community activity, and occupation  PERSONAL FACTORS: Time since onset of injury/illness/exacerbation are also affecting patient's functional outcome.   REHAB POTENTIAL: Good  CLINICAL DECISION MAKING: Stable/uncomplicated  EVALUATION COMPLEXITY: Low   GOALS: Goals reviewed with patient? No  SHORT TERM GOALS: Target date: 06/02/2023 Pt will demonstrate  appropriate understanding and performance of initially prescribed HEP in order to facilitate improved independence with management of symptoms.  Baseline: HEP provided on eval 06/02/23: demonstrates understanding Goal status: MET  2. Pt will score greater than or equal to 24 on FOTO in order to demonstrate improved perception of function due to symptoms.  Baseline: 1  Goal status: MET  LONG TERM GOALS: Target date: 06/30/2023 Pt will score 47 or greater on FOTO in order to demonstrate improved perception of function due to symptoms.  Baseline: 1 Goal status: INITIAL  2.  Pt will demonstrate at least 0-110 degrees of knee AROM in order to facilitate improved tolerance to functional movements such as walking/stairs.  Baseline: see ROM chart above Goal status: INITIAL  3.  Pt will demonstrate grossly symmetrical knee flex/extension MMT in order to facilitate improved functional strength.  Baseline: see MMT chart above Goal status: INITIAL  4.  Pt will be able to perform TUG in less than or equal to 14 sec with LRAD in order to indicate reduced risk of falling (cutoff score for fall risk 13.5 sec in community dwelling older adults per [facility] et al, 2000)  Baseline: 42 sec RW  06/09/23: 16.6 without AD  Goal status: ONGOING  5. Pt will demonstrate appropriate performance of final prescribed HEP in order to facilitate improved self-management of symptoms post-discharge.   Baseline: initial HEP prescribed  Goal status: INITIAL     PLAN:  PT FREQUENCY: 2x/week  PT DURATION: 8 weeks  PLANNED INTERVENTIONS: Therapeutic exercises, Therapeutic activity, Neuromuscular re-education, Balance training, Gait training, Patient/Family education, Self Care, Joint mobilization, Stair training, Aquatic Therapy, Dry Needling, Electrical stimulation, Cryotherapy, Moist heat, scar mobilization, Taping,  Vasopneumatic device, Manual therapy, and Re-evaluation  PLAN FOR NEXT SESSION: Review/update HEP  PRN. Knee ROM; gait training  sit to stands, quad strengthening. Stair training  Letitia Libra, PT, DPT, ATC 06/14/23 9:28 AM

## 2023-06-16 ENCOUNTER — Ambulatory Visit: Payer: PRIVATE HEALTH INSURANCE

## 2023-06-16 DIAGNOSIS — R2689 Other abnormalities of gait and mobility: Secondary | ICD-10-CM

## 2023-06-16 DIAGNOSIS — R600 Localized edema: Secondary | ICD-10-CM

## 2023-06-16 DIAGNOSIS — M6281 Muscle weakness (generalized): Secondary | ICD-10-CM

## 2023-06-16 DIAGNOSIS — M25562 Pain in left knee: Secondary | ICD-10-CM | POA: Diagnosis not present

## 2023-06-16 DIAGNOSIS — M25662 Stiffness of left knee, not elsewhere classified: Secondary | ICD-10-CM

## 2023-06-16 NOTE — Therapy (Signed)
OUTPATIENT PHYSICAL THERAPY LOWER EXTREMITY TREATMENT   Patient Name: Cheryl Bush MRN: [id number] DOB:27-Mar-1966, 57 y.o., female Today's Date: 06/16/2023  END OF SESSION:  PT End of Session - 06/16/23 0846     Visit Number 11    Number of Visits 17    Date for PT Re-Evaluation 06/30/23    Authorization Type [employer]    Authorization Time Period no auth til visit 40 per appt notes    PT Start Time 0846    PT Stop Time 0938    PT Time Calculation (min) 52 min                Past Medical History:  Diagnosis Date   High cholesterol    Inverted nipple left   abcess removed from areola, causing some scarring and inverted nipple   Past Surgical History:  Procedure Laterality Date   KNEE SURGERY     left- arthroscopy   TOTAL KNEE ARTHROPLASTY Left 04/23/2023   Procedure: LEFT TOTAL KNEE ARTHROPLASTY;  Surgeon: Tarry Kos, MD;  Location: [facility] OR;  Service: Orthopedics;  Laterality: Left;   Patient Active Problem List   Diagnosis Date Noted   Status post total left knee replacement 04/23/2023   Primary osteoarthritis of right knee 07/28/2022   Primary osteoarthritis of left knee 07/28/2022   Leukopenia 09/07/2016   Mastitis-left, medial aspect 03/15/2013    PCP: Norm Salt, PA  REFERRING PROVIDER: Tarry Kos, MD  REFERRING DIAG: [phone] (ICD-10-CM) - S/P total knee arthroplasty, left  THERAPY DIAG:  Left knee pain, unspecified chronicity  Stiffness of left knee, not elsewhere classified  Muscle weakness (generalized)  Localized edema  Other abnormalities of gait and mobility  Rationale for Evaluation and Treatment: Rehabilitation  ONSET DATE: 04/23/23 L TKA   SUBJECTIVE:   SUBJECTIVE STATEMENT: "It's so, so."   PERTINENT HISTORY: L TKA 04/23/23 PAIN:  Are you having pain: 8/10 Location/description: Lt anterior knee /medial  - aggravating factors: standing/walking, typical household activities, difficulty sleeping  - Easing factors:  icing, rest    PRECAUTIONS: None  WEIGHT BEARING RESTRICTIONS: No  FALLS:  Has patient fallen in last 6 months? No  LIVING ENVIRONMENT: 4 steps to enter home no rail, 1 level. Lives with her husband and her son (80 y.o). Pt typically does majority of housework.   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 Has to do lifting, variable weights  PLOF: Independent  PATIENT GOALS: get back to normal   NEXT MD VISIT: Friday (July 12), Aug 17  OBJECTIVE:   DIAGNOSTIC FINDINGS:  S/p L TKA 04/23/23 Unremarkable post op DVT work up  PATIENT SURVEYS:  FOTO 1 current, 47 predicted 06/07/23: 25% function   COGNITION: Overall cognitive status: Within functional limits for tasks assessed     SENSATION: Denies sensory complaints  EDEMA:  Gross edema about L knee joint and bruising apparent - formal exam deferred given compression stocking/pt attire  MUSCLE LENGTH: deferred  POSTURE: tendency to hold knee in slightly bent position, anteriorly placed while sitting  PALPATION: Gross post surgical soreness as expected  LOWER EXTREMITY ROM:     Active  Right eval Left eval Left 05/13/23 05/17/23 05/24/23 05/26/23 05/31/23 06/09/23  Hip flexion          Hip extension          Hip internal rotation          Hip external rotation  Knee extension [facility] Lacking 5 deg         Knee flexion WFL 40 deg active supine   60 deg active seated  87 deg passive seated A 96 supine Left:90 Left: 100  106 100 113  (Blank rows = not tested) (Key: WFL = within functional limits not formally assessed, * = concordant pain, s = stiffness/stretching sensation, NT = not tested)  Comments:    LOWER EXTREMITY MMT:    MMT Right eval Left eval  Hip flexion    Hip abduction (modified sitting)    Hip internal rotation    Hip external rotation    Knee flexion 5   Knee extension 5   Ankle dorsiflexion     (Blank rows = not tested) (Key: WFL =  within functional limits not formally assessed, * = concordant pain, s = stiffness/stretching sensation, NT = not tested)  Comments: LLE NT due to pain    FUNCTIONAL TESTS:   TUG 42 sec with RW ; 06/09/23: 16.6 without AD  06/16/23 TUG: 16 seconds with SPC   GAIT: Distance walked: within clinic Assistive device utilized: Environmental consultant - 2 wheeled Level of assistance: Modified independence Comments: step to pattern, discontinuous gait, reduced WB through surgical limb   TODAY'S TREATMENT:     [facility] Adult PT Treatment:                                                DATE: 06/16/23 Therapeutic Exercise: Recumbent bike no resistance full revolutions x 5 minutes  Sit to stand 1 x 10  Squats with UE support 2 x 10  Resisted knee extension 2 x 10 @ 5 lbs  Resisted HS curl 2 x 10 @ 10 lbs   Therapeutic Activity: Stair negotiation working on reciprocal step up with cane; step to with step down Modalities: Game ready x 10 minutes Lt knee, 38 degrees    [facility] Adult PT Treatment:                                                DATE: 06/14/23 Therapeutic Exercise: Recumbent bike no resistance full revolutions x 5 minutes  LAQ 2 x 10  Sit to stand 2 x 10 Step taps 8 inch step 2 x 10; RUE support  Step ups 4 inch and 6 inch 2 x 10; RUE support  Calf raises 2 x 10 Reviewed and updated HEP   Modalities: Game ready x 10 minutes Lt knee, 38 degrees    [facility] Adult PT Treatment:                                                DATE: 06/09/23 Therapeutic Exercise: Rec Bike Low seat L2 x 5 minutes Hip flexor stretch EOM- added strap 10 sec x 5  Hamstring stretch supine with strap 10 sec x 5  SLR 2# x 10 6 inch step up RUE support and LUE SPC x 10 Seated Leg press SL 15# x 15  Tandem stance trials SLS trials  Bilateral heel raises   Therapeutic Activity: TUG  16.6 sec  without AD  Modalities: Game ready x 10 minutes Lt knee, 38 degrees     [facility] Adult PT Treatment:                                                 DATE: 06/07/23 Therapeutic Exercise: NuStep level 4 x 5 minutes UE/LE  DL calf raise x 10  SL calf raise LLE x 10   Gait training: Pre-gait: forward step overs half foam roller working on heel strike and knee extension x 10 each  With SPC focusing on sequencing with SPC and heel strike Modalities: Game ready x 10 minutes Lt knee, 38 degrees      PATIENT EDUCATION:  Education details: HEP review Person educated: Patient and spouse Education method: Explanation Education comprehension: verbalized understanding  HOME EXERCISE PROGRAM: Access Code: [email or web address] URL: [email or web address] Date: 05/05/2023 Prepared by: Fransisco Hertz  Exercises - Seated Knee Flexion AAROM  - 2-3 x daily - 7 x weekly - 1 sets - 10 reps - Seated Knee Extension AAROM  - 2-3 x daily - 7 x weekly - 1 sets - 10 reps - Seated Quad Set  - 2-3 x daily - 7 x weekly - 1 sets - 10 reps  ASSESSMENT:  CLINICAL IMPRESSION: Patient arrives with 8/10 knee pain, though in NAD. Completed stair negotiation working on reciprocal step up with patient able to perform utilizing SPC with no signs of instability. She is unable to complete reciprocal step down due to pain and weakness at this time. Able to introduce machine strengthening today with patient demonstrating good control with resisted knee extension and flexion. She demonstrates good squat form with BUE support.   OBJECTIVE IMPAIRMENTS: Abnormal gait, decreased activity tolerance, decreased endurance, decreased mobility, difficulty walking, decreased ROM, decreased strength, increased edema, improper body mechanics, postural dysfunction, and pain.   ACTIVITY LIMITATIONS: carrying, lifting, bending, sitting, standing, squatting, sleeping, stairs, transfers, and locomotion level  PARTICIPATION LIMITATIONS: meal prep, cleaning, laundry, driving, shopping, community activity, and occupation  PERSONAL FACTORS: Time since onset of  injury/illness/exacerbation are also affecting patient's functional outcome.   REHAB POTENTIAL: Good  CLINICAL DECISION MAKING: Stable/uncomplicated  EVALUATION COMPLEXITY: Low   GOALS: Goals reviewed with patient? No  SHORT TERM GOALS: Target date: 06/02/2023 Pt will demonstrate appropriate understanding and performance of initially prescribed HEP in order to facilitate improved independence with management of symptoms.  Baseline: HEP provided on eval 06/02/23: demonstrates understanding Goal status: MET  2. Pt will score greater than or equal to 24 on FOTO in order to demonstrate improved perception of function due to symptoms.  Baseline: 1  Goal status: MET  LONG TERM GOALS: Target date: 06/30/2023 Pt will score 47 or greater on FOTO in order to demonstrate improved perception of function due to symptoms.  Baseline: 1 Goal status: INITIAL  2.  Pt will demonstrate at least 0-110 degrees of knee AROM in order to facilitate improved tolerance to functional movements such as walking/stairs.  Baseline: see ROM chart above Goal status: INITIAL  3.  Pt will demonstrate grossly symmetrical knee flex/extension MMT in order to facilitate improved functional strength.  Baseline: see MMT chart above Goal status: INITIAL  4.  Pt will be able to perform TUG in less than or equal to 14 sec with LRAD in order to indicate reduced risk of falling (cutoff score for fall  risk 13.5 sec in community dwelling older adults per [facility] et al, 2000)  Baseline: 42 sec RW  06/09/23: 16.6 without AD  Goal status: ONGOING  5. Pt will demonstrate appropriate performance of final prescribed HEP in order to facilitate improved self-management of symptoms post-discharge.   Baseline: initial HEP prescribed  Goal status: INITIAL     PLAN:  PT FREQUENCY: 2x/week  PT DURATION: 8 weeks  PLANNED INTERVENTIONS: Therapeutic exercises, Therapeutic activity, Neuromuscular re-education, Balance training, Gait  training, Patient/Family education, Self Care, Joint mobilization, Stair training, Aquatic Therapy, Dry Needling, Electrical stimulation, Cryotherapy, Moist heat, scar mobilization, Taping, Vasopneumatic device, Manual therapy, and Re-evaluation  PLAN FOR NEXT SESSION: Review/update HEP PRN. Knee ROM; gait training  sit to stands, quad strengthening. Stair training   Letitia Libra, PT, DPT, ATC 06/16/23 9:33 AM

## 2023-06-23 ENCOUNTER — Ambulatory Visit: Payer: PRIVATE HEALTH INSURANCE | Admitting: Physical Therapy

## 2023-06-23 ENCOUNTER — Encounter: Payer: Self-pay | Admitting: Physical Therapy

## 2023-06-23 DIAGNOSIS — M25662 Stiffness of left knee, not elsewhere classified: Secondary | ICD-10-CM

## 2023-06-23 DIAGNOSIS — M25562 Pain in left knee: Secondary | ICD-10-CM | POA: Diagnosis not present

## 2023-06-23 NOTE — Therapy (Signed)
OUTPATIENT PHYSICAL THERAPY LOWER EXTREMITY TREATMENT   Patient Name: Cheryl Bush MRN: [id number] DOB:23-May-1966, 57 y.o., female Today's Date: 06/23/2023  END OF SESSION:  PT End of Session - 06/23/23 0717     Visit Number 12    Number of Visits 17    Date for PT Re-Evaluation 06/30/23    Authorization Type [employer]    Authorization Time Period no auth til visit 40 per appt notes    PT Start Time 0716    PT Stop Time 0806    PT Time Calculation (min) 50 min                Past Medical History:  Diagnosis Date   High cholesterol    Inverted nipple left   abcess removed from areola, causing some scarring and inverted nipple   Past Surgical History:  Procedure Laterality Date   KNEE SURGERY     left- arthroscopy   TOTAL KNEE ARTHROPLASTY Left 04/23/2023   Procedure: LEFT TOTAL KNEE ARTHROPLASTY;  Surgeon: Tarry Kos, MD;  Location: [facility] OR;  Service: Orthopedics;  Laterality: Left;   Patient Active Problem List   Diagnosis Date Noted   Status post total left knee replacement 04/23/2023   Primary osteoarthritis of right knee 07/28/2022   Primary osteoarthritis of left knee 07/28/2022   Leukopenia 09/07/2016   Mastitis-left, medial aspect 03/15/2013    PCP: Norm Salt, PA  REFERRING PROVIDER: Tarry Kos, MD  REFERRING DIAG: [phone] (ICD-10-CM) - S/P total knee arthroplasty, left  THERAPY DIAG:  Left knee pain, unspecified chronicity  Stiffness of left knee, not elsewhere classified  Rationale for Evaluation and Treatment: Rehabilitation  ONSET DATE: 04/23/23 L TKA   SUBJECTIVE:   SUBJECTIVE STATEMENT: "It's so, so."   PERTINENT HISTORY: L TKA 04/23/23 PAIN:  Are you having pain: 8/10 Location/description: Lt anterior knee /medial  - aggravating factors: standing/walking, typical household activities, difficulty sleeping  - Easing factors: icing, rest    PRECAUTIONS: None  WEIGHT BEARING RESTRICTIONS: No  FALLS:  Has patient  fallen in last 6 months? No  LIVING ENVIRONMENT: 4 steps to enter home no rail, 1 level. Lives with her husband and her son (2 y.o). Pt typically does majority of housework.   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 Has to do lifting, variable weights  PLOF: Independent  PATIENT GOALS: get back to normal   NEXT MD VISIT: Friday (July 12), Aug 17  OBJECTIVE:   DIAGNOSTIC FINDINGS:  S/p L TKA 04/23/23 Unremarkable post op DVT work up  PATIENT SURVEYS:  FOTO 1 current, 47 predicted 06/07/23: 25% function   COGNITION: Overall cognitive status: Within functional limits for tasks assessed     SENSATION: Denies sensory complaints  EDEMA:  Gross edema about L knee joint and bruising apparent - formal exam deferred given compression stocking/pt attire  MUSCLE LENGTH: deferred  POSTURE: tendency to hold knee in slightly bent position, anteriorly placed while sitting  PALPATION: Gross post surgical soreness as expected  LOWER EXTREMITY ROM:     Active  Right eval Left eval Left 05/13/23 05/17/23 05/24/23 05/26/23 05/31/23 06/09/23  Hip flexion          Hip extension          Hip internal rotation          Hip external rotation          Knee extension [facility] Lacking 5 deg  Knee flexion WFL 40 deg active supine   60 deg active seated  87 deg passive seated A 96 supine Left:90 Left: 100  106 100 113  (Blank rows = not tested) (Key: WFL = within functional limits not formally assessed, * = concordant pain, s = stiffness/stretching sensation, NT = not tested)  Comments:    LOWER EXTREMITY MMT:    MMT Right eval Left eval  Hip flexion    Hip abduction (modified sitting)    Hip internal rotation    Hip external rotation    Knee flexion 5   Knee extension 5   Ankle dorsiflexion     (Blank rows = not tested) (Key: WFL = within functional limits not formally assessed, * = concordant pain, s = stiffness/stretching  sensation, NT = not tested)  Comments: LLE NT due to pain    FUNCTIONAL TESTS:   TUG 42 sec with RW ; 06/09/23: 16.6 without AD  06/16/23 TUG: 16 seconds with SPC   GAIT: Distance walked: within clinic Assistive device utilized: Environmental consultant - 2 wheeled Level of assistance: Modified independence Comments: step to pattern, discontinuous gait, reduced WB through surgical limb   TODAY'S TREATMENT:     [facility] Adult PT Treatment:                                                DATE: 06/23/23 Therapeutic Exercise: Rec Bike full revolutions  Leg press 25# , 45# bilat  Knee ext 5# x 10 Knee flexion 15# x 10  4 inch step down front 4 inch lateral step down Heel raise  gastroc stretch  SLR 2# x 10 SAQ 5# x 10 Manual Therapy: PROM knee flexion and ext   Modalities: Game ready x 10 minutes Lt knee, 38 degrees     [facility] Adult PT Treatment:                                                DATE: 06/16/23 Therapeutic Exercise: Recumbent bike no resistance full revolutions x 5 minutes  Sit to stand 1 x 10  Squats with UE support 2 x 10  Resisted knee extension 2 x 10 @ 5 lbs  Resisted HS curl 2 x 10 @ 10 lbs   Therapeutic Activity: Stair negotiation working on reciprocal step up with cane; step to with step down Modalities: Game ready x 10 minutes Lt knee, 38 degrees    [facility] Adult PT Treatment:                                                DATE: 06/14/23 Therapeutic Exercise: Recumbent bike no resistance full revolutions x 5 minutes  LAQ 2 x 10  Sit to stand 2 x 10 Step taps 8 inch step 2 x 10; RUE support  Step ups 4 inch and 6 inch 2 x 10; RUE support  Calf raises 2 x 10 Reviewed and updated HEP   Modalities: Game ready x 10 minutes Lt knee, 38 degrees    [facility] Adult PT Treatment:  DATE: 06/09/23 Therapeutic Exercise: Rec Bike Low seat L2 x 5 minutes Hip flexor stretch EOM- added strap 10 sec x 5  Hamstring stretch supine with strap  10 sec x 5  SLR 2# x 10 6 inch step up RUE support and LUE SPC x 10 Seated Leg press SL 15# x 15  Tandem stance trials SLS trials  Bilateral heel raises   Therapeutic Activity: TUG  16.6 sec without AD  Modalities: Game ready x 10 minutes Lt knee, 38 degrees     [facility] Adult PT Treatment:                                                DATE: 06/07/23 Therapeutic Exercise: NuStep level 4 x 5 minutes UE/LE  DL calf raise x 10  SL calf raise LLE x 10   Gait training: Pre-gait: forward step overs half foam roller working on heel strike and knee extension x 10 each  With SPC focusing on sequencing with SPC and heel strike Modalities: Game ready x 10 minutes Lt knee, 38 degrees      PATIENT EDUCATION:  Education details: HEP review Person educated: Patient and spouse Education method: Explanation Education comprehension: verbalized understanding  HOME EXERCISE PROGRAM: Access Code: [email or web address] URL: [email or web address] Date: 05/05/2023 Prepared by: Fransisco Hertz  Exercises - Seated Knee Flexion AAROM  - 2-3 x daily - 7 x weekly - 1 sets - 10 reps - Seated Knee Extension AAROM  - 2-3 x daily - 7 x weekly - 1 sets - 10 reps - Seated Quad Set  - 2-3 x daily - 7 x weekly - 1 sets - 10 reps  ASSESSMENT:  CLINICAL IMPRESSION: Patient arrives with 8/10 knee pain, though in NAD. Reports that the bike at home made her sore. She has some edema visible in left knee. Continued machine strengthening today with patient demonstrating good control with resisted knee extension and flexion. Continued with step ups and began eccentric step dows with 1 UE support. She would like to kneel to pray. We will plan to work on kneeling at next session.   OBJECTIVE IMPAIRMENTS: Abnormal gait, decreased activity tolerance, decreased endurance, decreased mobility, difficulty walking, decreased ROM, decreased strength, increased edema, improper body mechanics, postural dysfunction, and pain.    ACTIVITY LIMITATIONS: carrying, lifting, bending, sitting, standing, squatting, sleeping, stairs, transfers, and locomotion level  PARTICIPATION LIMITATIONS: meal prep, cleaning, laundry, driving, shopping, community activity, and occupation  PERSONAL FACTORS: Time since onset of injury/illness/exacerbation are also affecting patient's functional outcome.   REHAB POTENTIAL: Good  CLINICAL DECISION MAKING: Stable/uncomplicated  EVALUATION COMPLEXITY: Low   GOALS: Goals reviewed with patient? No  SHORT TERM GOALS: Target date: 06/02/2023 Pt will demonstrate appropriate understanding and performance of initially prescribed HEP in order to facilitate improved independence with management of symptoms.  Baseline: HEP provided on eval 06/02/23: demonstrates understanding Goal status: MET  2. Pt will score greater than or equal to 24 on FOTO in order to demonstrate improved perception of function due to symptoms.  Baseline: 1  Goal status: MET  LONG TERM GOALS: Target date: 06/30/2023 Pt will score 47 or greater on FOTO in order to demonstrate improved perception of function due to symptoms.  Baseline: 1 Goal status: INITIAL  2.  Pt will demonstrate at least 0-110 degrees of knee AROM in order to facilitate improved tolerance  to functional movements such as walking/stairs.  Baseline: see ROM chart above Goal status: INITIAL  3.  Pt will demonstrate grossly symmetrical knee flex/extension MMT in order to facilitate improved functional strength.  Baseline: see MMT chart above Goal status: INITIAL  4.  Pt will be able to perform TUG in less than or equal to 14 sec with LRAD in order to indicate reduced risk of falling (cutoff score for fall risk 13.5 sec in community dwelling older adults per [facility] et al, 2000)  Baseline: 42 sec RW  06/09/23: 16.6 without AD  Goal status: ONGOING  5. Pt will demonstrate appropriate performance of final prescribed HEP in order to facilitate  improved self-management of symptoms post-discharge.   Baseline: initial HEP prescribed  Goal status: INITIAL     PLAN:  PT FREQUENCY: 2x/week  PT DURATION: 8 weeks  PLANNED INTERVENTIONS: Therapeutic exercises, Therapeutic activity, Neuromuscular re-education, Balance training, Gait training, Patient/Family education, Self Care, Joint mobilization, Stair training, Aquatic Therapy, Dry Needling, Electrical stimulation, Cryotherapy, Moist heat, scar mobilization, Taping, Vasopneumatic device, Manual therapy, and Re-evaluation  PLAN FOR NEXT SESSION: Review/update HEP PRN. Knee ROM; gait training  sit to stands, quad strengthening. Stair training   Jannette Spanner, PTA 06/23/23 9:17 AM Phone: [phone] Fax: [phone]

## 2023-06-25 ENCOUNTER — Ambulatory Visit: Payer: PRIVATE HEALTH INSURANCE | Admitting: Physical Therapy

## 2023-06-25 ENCOUNTER — Encounter: Payer: Self-pay | Admitting: Physical Therapy

## 2023-06-25 DIAGNOSIS — M25562 Pain in left knee: Secondary | ICD-10-CM

## 2023-06-25 DIAGNOSIS — M25662 Stiffness of left knee, not elsewhere classified: Secondary | ICD-10-CM

## 2023-06-25 NOTE — Therapy (Signed)
OUTPATIENT PHYSICAL THERAPY LOWER EXTREMITY TREATMENT   Patient Name: Cheryl Bush MRN: [id number] DOB:1966/09/24, 57 y.o., female Today's Date: 06/25/2023  END OF SESSION:  PT End of Session - 06/25/23 0809     Visit Number 13    Number of Visits 17    Date for PT Re-Evaluation 06/30/23    Authorization Type [employer]    Authorization Time Period no auth til visit 40 per appt notes    PT Start Time 0805    PT Stop Time 0858    PT Time Calculation (min) 53 min                Past Medical History:  Diagnosis Date   High cholesterol    Inverted nipple left   abcess removed from areola, causing some scarring and inverted nipple   Past Surgical History:  Procedure Laterality Date   KNEE SURGERY     left- arthroscopy   TOTAL KNEE ARTHROPLASTY Left 04/23/2023   Procedure: LEFT TOTAL KNEE ARTHROPLASTY;  Surgeon: Tarry Kos, MD;  Location: [facility] OR;  Service: Orthopedics;  Laterality: Left;   Patient Active Problem List   Diagnosis Date Noted   Status post total left knee replacement 04/23/2023   Primary osteoarthritis of right knee 07/28/2022   Primary osteoarthritis of left knee 07/28/2022   Leukopenia 09/07/2016   Mastitis-left, medial aspect 03/15/2013    PCP: Norm Salt, PA  REFERRING PROVIDER: Tarry Kos, MD  REFERRING DIAG: [phone] (ICD-10-CM) - S/P total knee arthroplasty, left  THERAPY DIAG:  Left knee pain, unspecified chronicity  Stiffness of left knee, not elsewhere classified  Rationale for Evaluation and Treatment: Rehabilitation  ONSET DATE: 04/23/23 L TKA   SUBJECTIVE:   SUBJECTIVE STATEMENT: "I was walking some yesterday and the knee gave away. I am scared I might fall."    PERTINENT HISTORY: L TKA 04/23/23 PAIN:  Are you having pain: 8.5/10 Location/description: Lt anterior knee /medial  - aggravating factors: standing/walking, typical household activities, difficulty sleeping  - Easing factors: icing, rest     PRECAUTIONS: None  WEIGHT BEARING RESTRICTIONS: No  FALLS:  Has patient fallen in last 6 months? No  LIVING ENVIRONMENT: 4 steps to enter home no rail, 1 level. Lives with her husband and her son (69 y.o). Pt typically does majority of housework.   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 Has to do lifting, variable weights  PLOF: Independent  PATIENT GOALS: get back to normal   NEXT MD VISIT: Friday (July 12), Aug 17  OBJECTIVE:   DIAGNOSTIC FINDINGS:  S/p L TKA 04/23/23 Unremarkable post op DVT work up  PATIENT SURVEYS:  FOTO 1 current, 47 predicted 06/07/23: 25% function   COGNITION: Overall cognitive status: Within functional limits for tasks assessed     SENSATION: Denies sensory complaints  EDEMA:  Gross edema about L knee joint and bruising apparent - formal exam deferred given compression stocking/pt attire  MUSCLE LENGTH: deferred  POSTURE: tendency to hold knee in slightly bent position, anteriorly placed while sitting  PALPATION: Gross post surgical soreness as expected  LOWER EXTREMITY ROM:     Active  Right eval Left eval Left 05/13/23 05/17/23 05/24/23 05/26/23 05/31/23 06/09/23  Hip flexion          Hip extension          Hip internal rotation          Hip external rotation  Knee extension [facility] Lacking 5 deg         Knee flexion WFL 40 deg active supine   60 deg active seated  87 deg passive seated A 96 supine Left:90 Left: 100  106 100 113  (Blank rows = not tested) (Key: WFL = within functional limits not formally assessed, * = concordant pain, s = stiffness/stretching sensation, NT = not tested)  Comments:    LOWER EXTREMITY MMT:    MMT Right eval Left eval  Hip flexion    Hip abduction (modified sitting)    Hip internal rotation    Hip external rotation    Knee flexion 5   Knee extension 5   Ankle dorsiflexion     (Blank rows = not tested) (Key: WFL = within  functional limits not formally assessed, * = concordant pain, s = stiffness/stretching sensation, NT = not tested)  Comments: LLE NT due to pain    FUNCTIONAL TESTS:   TUG 42 sec with RW ; 06/09/23: 16.6 without AD  06/16/23 TUG: 16 seconds with SPC   GAIT: Distance walked: within clinic Assistive device utilized: Environmental consultant - 2 wheeled Level of assistance: Modified independence Comments: step to pattern, discontinuous gait, reduced WB through surgical limb   TODAY'S TREATMENT:     [facility] Adult PT Treatment:                                                DATE: 06/25/23 Therapeutic Exercise: Rec Bike full revolutions  45# bilat x15, Left only 25# x15  Knee ext bilat 10# x 10,  Left only 5# x 10 Knee flexion 25# x 10 bilat  10# Sit -stand  STS with LLE back x 10 TKE with Blue band standing   Therapeutic Activity: Qped Lateral weight shifting and A/P rocking for desensitization and practice for kneeling to pray Kneel down onto AIREX pad with 1 UE assist on mat table , then rocking into childs pose for prayer simulation Modalities: Game ready x 15 minutes Lt knee, 38 degrees , medium compression   [facility] Adult PT Treatment:                                                DATE: 06/23/23 Therapeutic Exercise: Rec Bike full revolutions  Leg press 25# , 45# bilat  Knee ext 5# x 10 Knee flexion 15# x 10  4 inch step down front 4 inch lateral step down Heel raise  gastroc stretch  SLR 2# x 10 SAQ 5# x 10 Manual Therapy: PROM knee flexion and ext   Modalities: Game ready x 10 minutes Lt knee, 38 degrees     [facility] Adult PT Treatment:                                                DATE: 06/16/23 Therapeutic Exercise: Recumbent bike no resistance full revolutions x 5 minutes  Sit to stand 1 x 10  Squats with UE support 2 x 10  Resisted knee extension 2 x 10 @ 5 lbs  Resisted HS curl 2 x 10 @  10 lbs   Therapeutic Activity: Stair negotiation working on reciprocal step up with cane;  step to with step down Modalities: Game ready x 10 minutes Lt knee, 38 degrees    [facility] Adult PT Treatment:                                                DATE: 06/14/23 Therapeutic Exercise: Recumbent bike no resistance full revolutions x 5 minutes  LAQ 2 x 10  Sit to stand 2 x 10 Step taps 8 inch step 2 x 10; RUE support  Step ups 4 inch and 6 inch 2 x 10; RUE support  Calf raises 2 x 10 Reviewed and updated HEP   Modalities: Game ready x 10 minutes Lt knee, 38 degrees    [facility] Adult PT Treatment:                                                DATE: 06/09/23 Therapeutic Exercise: Rec Bike Low seat L2 x 5 minutes Hip flexor stretch EOM- added strap 10 sec x 5  Hamstring stretch supine with strap 10 sec x 5  SLR 2# x 10 6 inch step up RUE support and LUE SPC x 10 Seated Leg press SL 15# x 15  Tandem stance trials SLS trials  Bilateral heel raises   Therapeutic Activity: TUG  16.6 sec without AD  Modalities: Game ready x 10 minutes Lt knee, 38 degrees     [facility] Adult PT Treatment:                                                DATE: 06/07/23 Therapeutic Exercise: NuStep level 4 x 5 minutes UE/LE  DL calf raise x 10  SL calf raise LLE x 10   Gait training: Pre-gait: forward step overs half foam roller working on heel strike and knee extension x 10 each  With SPC focusing on sequencing with SPC and heel strike Modalities: Game ready x 10 minutes Lt knee, 38 degrees      PATIENT EDUCATION:  Education details: HEP review Person educated: Patient and spouse Education method: Explanation Education comprehension: verbalized understanding  HOME EXERCISE PROGRAM: Access Code: [email or web address] URL: [email or web address] Date: 05/05/2023 Prepared by: Fransisco Hertz  Exercises - Seated Knee Flexion AAROM  - 2-3 x daily - 7 x weekly - 1 sets - 10 reps - Seated Knee Extension AAROM  - 2-3 x daily - 7 x weekly - 1 sets - 10 reps - Seated Quad Set  - 2-3 x daily - 7  x weekly - 1 sets - 10 reps  ASSESSMENT:  CLINICAL IMPRESSION: Patient arrives with 8.5/10 knee pain, though in NAD. Reports knee feels like it will give away intermittently with walking.  Progressed machine strengthening today with patient demonstrating good control with resisted knee extension and flexion. Reviewed TKE with band for HEP, as pt was placing band in the incorrect position. Pt also voiced that she wants to try kneeling to pray. She was able to get into quadruped and weight shift as well as kneel down onto  AIREX pad in floor using mat table assist with 1 UE, get into childs pose and rise back up MOD I. Increased VASO compression to moderate today, as patient does demo continued visual edema.   OBJECTIVE IMPAIRMENTS: Abnormal gait, decreased activity tolerance, decreased endurance, decreased mobility, difficulty walking, decreased ROM, decreased strength, increased edema, improper body mechanics, postural dysfunction, and pain.   ACTIVITY LIMITATIONS: carrying, lifting, bending, sitting, standing, squatting, sleeping, stairs, transfers, and locomotion level  PARTICIPATION LIMITATIONS: meal prep, cleaning, laundry, driving, shopping, community activity, and occupation  PERSONAL FACTORS: Time since onset of injury/illness/exacerbation are also affecting patient's functional outcome.   REHAB POTENTIAL: Good  CLINICAL DECISION MAKING: Stable/uncomplicated  EVALUATION COMPLEXITY: Low   GOALS: Goals reviewed with patient? No  SHORT TERM GOALS: Target date: 06/02/2023 Pt will demonstrate appropriate understanding and performance of initially prescribed HEP in order to facilitate improved independence with management of symptoms.  Baseline: HEP provided on eval 06/02/23: demonstrates understanding Goal status: MET  2. Pt will score greater than or equal to 24 on FOTO in order to demonstrate improved perception of function due to symptoms.  Baseline: 1  Goal status: MET  LONG TERM  GOALS: Target date: 06/30/2023 Pt will score 47 or greater on FOTO in order to demonstrate improved perception of function due to symptoms.  Baseline: 1 Goal status: INITIAL  2.  Pt will demonstrate at least 0-110 degrees of knee AROM in order to facilitate improved tolerance to functional movements such as walking/stairs.  Baseline: see ROM chart above Goal status: MET  3.  Pt will demonstrate grossly symmetrical knee flex/extension MMT in order to facilitate improved functional strength.  Baseline: see MMT chart above Goal status: INITIAL  4.  Pt will be able to perform TUG in less than or equal to 14 sec with LRAD in order to indicate reduced risk of falling (cutoff score for fall risk 13.5 sec in community dwelling older adults per [facility] et al, 2000)  Baseline: 42 sec RW  06/09/23: 16.6 without AD  Goal status: ONGOING  5. Pt will demonstrate appropriate performance of final prescribed HEP in order to facilitate improved self-management of symptoms post-discharge.   Baseline: initial HEP prescribed  Goal status: INITIAL     PLAN:  PT FREQUENCY: 2x/week  PT DURATION: 8 weeks  PLANNED INTERVENTIONS: Therapeutic exercises, Therapeutic activity, Neuromuscular re-education, Balance training, Gait training, Patient/Family education, Self Care, Joint mobilization, Stair training, Aquatic Therapy, Dry Needling, Electrical stimulation, Cryotherapy, Moist heat, scar mobilization, Taping, Vasopneumatic device, Manual therapy, and Re-evaluation  PLAN FOR NEXT SESSION: Review/update HEP PRN. Knee ROM; gait training  sit to stands, quad strengthening. Stair training   Jannette Spanner, PTA 06/25/23 8:49 AM Phone: [phone] Fax: [phone]

## 2023-07-02 ENCOUNTER — Ambulatory Visit: Payer: PRIVATE HEALTH INSURANCE | Attending: Orthopaedic Surgery | Admitting: Physical Therapy

## 2023-07-02 ENCOUNTER — Encounter: Payer: Self-pay | Admitting: Physical Therapy

## 2023-07-02 DIAGNOSIS — M6281 Muscle weakness (generalized): Secondary | ICD-10-CM | POA: Insufficient documentation

## 2023-07-02 DIAGNOSIS — R600 Localized edema: Secondary | ICD-10-CM | POA: Insufficient documentation

## 2023-07-02 DIAGNOSIS — M25662 Stiffness of left knee, not elsewhere classified: Secondary | ICD-10-CM | POA: Diagnosis present

## 2023-07-02 DIAGNOSIS — M25562 Pain in left knee: Secondary | ICD-10-CM | POA: Diagnosis present

## 2023-07-02 NOTE — Therapy (Signed)
OUTPATIENT PHYSICAL THERAPY LOWER EXTREMITY TREATMENT   Patient Name: Cheryl Bush MRN: [id number] DOB:[birth year], 57 y.o., female Today's Date: 07/02/2023  END OF SESSION:  PT End of Session - 07/02/23 0807     Visit Number 14    Number of Visits 17    Date for PT Re-Evaluation 06/30/23    Authorization Type [employer]    Authorization Time Period no auth til visit 40 per appt notes    PT Start Time 0802    PT Stop Time 0900    PT Time Calculation (min) 58 min                Past Medical History:  Diagnosis Date   High cholesterol    Inverted nipple left   abcess removed from areola, causing some scarring and inverted nipple   Past Surgical History:  Procedure Laterality Date   KNEE SURGERY     left- arthroscopy   TOTAL KNEE ARTHROPLASTY Left 04/23/2023   Procedure: LEFT TOTAL KNEE ARTHROPLASTY;  Surgeon: Tarry Kos, MD;  Location: [facility] OR;  Service: Orthopedics;  Laterality: Left;   Patient Active Problem List   Diagnosis Date Noted   Status post total left knee replacement 04/23/2023   Primary osteoarthritis of right knee 07/28/2022   Primary osteoarthritis of left knee 07/28/2022   Leukopenia 09/07/2016   Mastitis-left, medial aspect 03/15/2013    PCP: Norm Salt, PA  REFERRING PROVIDER: Tarry Kos, MD  REFERRING DIAG: [phone] (ICD-10-CM) - S/P total knee arthroplasty, left  THERAPY DIAG:  Left knee pain, unspecified chronicity  Stiffness of left knee, not elsewhere classified  Rationale for Evaluation and Treatment: Rehabilitation  ONSET DATE: 04/23/23 L TKA   SUBJECTIVE:   SUBJECTIVE STATEMENT: "I am having more pain on the inside of the knee. It feels like it will move or give away when I walk. I see Dr on Tuesday."     PERTINENT HISTORY: L TKA 04/23/23 PAIN:  Are you having pain: 9/10 Location/description: Lt anterior knee /medial  - aggravating factors: standing/walking, typical household activities, difficulty sleeping  -  Easing factors: icing, rest    PRECAUTIONS: None  WEIGHT BEARING RESTRICTIONS: No  FALLS:  Has patient fallen in last 6 months? No  LIVING ENVIRONMENT: 4 steps to enter home no rail, 1 level. Lives with her husband and her son (39 y.o). Pt typically does majority of housework.   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 Has to do lifting, variable weights  PLOF: Independent  PATIENT GOALS: get back to normal   NEXT MD VISIT: Friday (July 12), Aug 17, Sep 10  OBJECTIVE:   DIAGNOSTIC FINDINGS:  S/p L TKA 04/23/23 Unremarkable post op DVT work up  PATIENT SURVEYS:  FOTO 1 current, 47 predicted 06/07/23: 25% function  07/02/23: 38%   COGNITION: Overall cognitive status: Within functional limits for tasks assessed     SENSATION: Denies sensory complaints  EDEMA:  Gross edema about L knee joint and bruising apparent - formal exam deferred given compression stocking/pt attire  MUSCLE LENGTH: deferred  POSTURE: tendency to hold knee in slightly bent position, anteriorly placed while sitting  PALPATION: Gross post surgical soreness as expected  LOWER EXTREMITY ROM:     Active  Right eval Left eval Left 05/13/23 05/17/23 05/24/23 05/26/23 05/31/23 06/09/23 07/02/23  Hip flexion           Hip extension  Hip internal rotation           Hip external rotation           Knee extension [facility] Lacking 5 deg          Knee flexion WFL 40 deg active supine   60 deg active seated  87 deg passive seated A 96 supine Left:90 Left: 100  106 100 113 120 AA  (Blank rows = not tested) (Key: WFL = within functional limits not formally assessed, * = concordant pain, s = stiffness/stretching sensation, NT = not tested)  Comments:    LOWER EXTREMITY MMT:    MMT Right eval Left eval Left 07/02/23  Hip flexion     Hip abduction (modified sitting)     Hip internal rotation     Hip external rotation     Knee flexion 5  4  Knee  extension 5  4  Ankle dorsiflexion      (Blank rows = not tested) (Key: WFL = within functional limits not formally assessed, * = concordant pain, s = stiffness/stretching sensation, NT = not tested)  Comments: LLE NT due to pain    FUNCTIONAL TESTS:   TUG 42 sec with RW ; 06/09/23: 16.6 without AD  06/16/23 TUG: 16 seconds with SPC   GAIT: Distance walked: within clinic Assistive device utilized: Environmental consultant - 2 wheeled Level of assistance: Modified independence Comments: step to pattern, discontinuous gait, reduced WB through surgical limb   TODAY'S TREATMENT:     [facility] Adult PT Treatment:                                                DATE: 07/02/23 Therapeutic Exercise: Nustep L4 x 5 min H/s stretch supine with strap  QS SLR SAQ Heel slides with strap to 120   Therapeutic Activity: TUG 15.8 sec without AD Modalities: Vaso medium pressure, coldest setting x 15 minutes     [facility] Adult PT Treatment:                                                DATE: 06/25/23 Therapeutic Exercise: Rec Bike full revolutions  45# bilat x15, Left only 25# x15  Knee ext bilat 10# x 10,  Left only 5# x 10 Knee flexion 25# x 10 bilat  10# Sit -stand  STS with LLE back x 10 TKE with Blue band standing   Therapeutic Activity: Qped Lateral weight shifting and A/P rocking for desensitization and practice for kneeling to pray Kneel down onto AIREX pad with 1 UE assist on mat table , then rocking into childs pose for prayer simulation Modalities: Game ready x 15 minutes Lt knee, 38 degrees , medium compression   [facility] Adult PT Treatment:                                                DATE: 06/23/23 Therapeutic Exercise: Rec Bike full revolutions  Leg press 25# , 45# bilat  Knee ext 5# x 10 Knee flexion 15# x 10  4 inch step down front 4 inch lateral  step down Heel raise  gastroc stretch  SLR 2# x 10 SAQ 5# x 10 Manual Therapy: PROM knee flexion and ext   Modalities: Game ready x 10  minutes Lt knee, 38 degrees     [facility] Adult PT Treatment:                                                DATE: 06/16/23 Therapeutic Exercise: Recumbent bike no resistance full revolutions x 5 minutes  Sit to stand 1 x 10  Squats with UE support 2 x 10  Resisted knee extension 2 x 10 @ 5 lbs  Resisted HS curl 2 x 10 @ 10 lbs   Therapeutic Activity: Stair negotiation working on reciprocal step up with cane; step to with step down Modalities: Game ready x 10 minutes Lt knee, 38 degrees    [facility] Adult PT Treatment:                                                DATE: 06/14/23 Therapeutic Exercise: Recumbent bike no resistance full revolutions x 5 minutes  LAQ 2 x 10  Sit to stand 2 x 10 Step taps 8 inch step 2 x 10; RUE support  Step ups 4 inch and 6 inch 2 x 10; RUE support  Calf raises 2 x 10 Reviewed and updated HEP   Modalities: Game ready x 10 minutes Lt knee, 38 degrees         PATIENT EDUCATION:  Education details: HEP review Person educated: Patient and spouse Education method: Explanation Education comprehension: verbalized understanding  HOME EXERCISE PROGRAM: Access Code: [email or web address] URL: [email or web address] Date: 05/05/2023 Prepared by: Fransisco Hertz  Exercises - Seated Knee Flexion AAROM  - 2-3 x daily - 7 x weekly - 1 sets - 10 reps - Seated Knee Extension AAROM  - 2-3 x daily - 7 x weekly - 1 sets - 10 reps - Seated Quad Set  - 2-3 x daily - 7 x weekly - 1 sets - 10 reps  ASSESSMENT:  CLINICAL IMPRESSION: Patient arrives with 9/10 knee pain, increased antalgic pattern. She is 10 weeks S/P Left TKA.  Reports knee has been more painful over the last several days. She has been getting down to pray in prayer position 1 x per day and this could be the source of her increased pain. She feels like the medial knee shifts, has c/o this before and MD assured her that her prosthetic looks good on xrays. She will see MD Tuesday for f/u.  She continues  to have edema in left leg, wears her compression stockings. Pt has grossly 4/5 knee strength and improved TUG and FOTO score. She has progressed well with ROM and function however struggles with continued pain and edema. Will see PT for re-assessment next visit and consider extension of POC after she sees MD next week.   OBJECTIVE IMPAIRMENTS: Abnormal gait, decreased activity tolerance, decreased endurance, decreased mobility, difficulty walking, decreased ROM, decreased strength, increased edema, improper body mechanics, postural dysfunction, and pain.   ACTIVITY LIMITATIONS: carrying, lifting, bending, sitting, standing, squatting, sleeping, stairs, transfers, and locomotion level  PARTICIPATION LIMITATIONS: meal prep, cleaning, laundry, driving, shopping, community activity, and occupation  PERSONAL FACTORS: Time  since onset of injury/illness/exacerbation are also affecting patient's functional outcome.   REHAB POTENTIAL: Good  CLINICAL DECISION MAKING: Stable/uncomplicated  EVALUATION COMPLEXITY: Low   GOALS: Goals reviewed with patient? No  SHORT TERM GOALS: Target date: 06/02/2023 Pt will demonstrate appropriate understanding and performance of initially prescribed HEP in order to facilitate improved independence with management of symptoms.  Baseline: HEP provided on eval 06/02/23: demonstrates understanding Goal status: MET  2. Pt will score greater than or equal to 24 on FOTO in order to demonstrate improved perception of function due to symptoms.  Baseline: 1  Goal status: MET  LONG TERM GOALS: Target date: 06/30/2023 Pt will score 47 or greater on FOTO in order to demonstrate improved perception of function due to symptoms.  Baseline: 1 07/02/23: 38% Goal status: ONGOING  2.  Pt will demonstrate at least 0-110 degrees of knee AROM in order to facilitate improved tolerance to functional movements such as walking/stairs.  Baseline: see ROM chart above Goal status: MET  3.   Pt will demonstrate grossly symmetrical knee flex/extension MMT in order to facilitate improved functional strength.  Baseline: see MMT chart above 07/02/23: 4/5 left knee Goal status: ONGOING  4.  Pt will be able to perform TUG in less than or equal to 14 sec with LRAD in order to indicate reduced risk of falling (cutoff score for fall risk 13.5 sec in community dwelling older adults per [facility] et al, 2000)  Baseline: 42 sec RW  06/09/23: 16.6 without AD  07/02/23: 15.8 sec without AD   Goal status: ONGOING  5. Pt will demonstrate appropriate performance of final prescribed HEP in order to facilitate improved self-management of symptoms post-discharge.   Baseline: initial HEP prescribed  Goal status: ONGOING   PLAN:  PT FREQUENCY: 2x/week  PT DURATION: 8 weeks  PLANNED INTERVENTIONS: Therapeutic exercises, Therapeutic activity, Neuromuscular re-education, Balance training, Gait training, Patient/Family education, Self Care, Joint mobilization, Stair training, Aquatic Therapy, Dry Needling, Electrical stimulation, Cryotherapy, Moist heat, scar mobilization, Taping, Vasopneumatic device, Manual therapy, and Re-evaluation  PLAN FOR NEXT SESSION: Review/update HEP PRN. Knee ROM; gait training  sit to stands, quad strengthening. Stair training   Jannette Spanner, PTA 07/02/23 11:32 AM Phone: [phone] Fax: [phone]

## 2023-07-06 ENCOUNTER — Telehealth: Payer: Self-pay | Admitting: Orthopaedic Surgery

## 2023-07-06 ENCOUNTER — Ambulatory Visit: Payer: PRIVATE HEALTH INSURANCE

## 2023-07-06 ENCOUNTER — Other Ambulatory Visit: Payer: Self-pay

## 2023-07-06 ENCOUNTER — Ambulatory Visit (INDEPENDENT_AMBULATORY_CARE_PROVIDER_SITE_OTHER): Payer: PRIVATE HEALTH INSURANCE | Admitting: Physician Assistant

## 2023-07-06 DIAGNOSIS — Z96652 Presence of left artificial knee joint: Secondary | ICD-10-CM

## 2023-07-06 MED ORDER — BUPIVACAINE HCL 0.25 % IJ SOLN
2.0000 mL | INTRAMUSCULAR | Status: AC | PRN
Start: 2023-07-06 — End: 2023-07-06
  Administered 2023-07-06: 2 mL via INTRA_ARTICULAR

## 2023-07-06 MED ORDER — LIDOCAINE HCL 1 % IJ SOLN
2.0000 mL | INTRAMUSCULAR | Status: AC | PRN
Start: 2023-07-06 — End: 2023-07-06
  Administered 2023-07-06: 2 mL

## 2023-07-06 MED ORDER — METHYLPREDNISOLONE ACETATE 40 MG/ML IJ SUSP
40.0000 mg | INTRAMUSCULAR | Status: AC | PRN
Start: 2023-07-06 — End: 2023-07-06
  Administered 2023-07-06: 40 mg via INTRA_ARTICULAR

## 2023-07-06 NOTE — Telephone Encounter (Signed)
[employer] forms received. To [employer]. 

## 2023-07-06 NOTE — Progress Notes (Signed)
Post-Op Visit Note   Patient: Cheryl Bush           Date of Birth: 12-May-1966           MRN: [id number] Visit Date: 07/06/2023  Procedure Note  Patient: Cheryl Bush             Date of Birth: August 30, 1966           MRN: [id number]             Visit Date: 07/06/2023  Procedures: Visit Diagnoses:  1. Status post total left knee replacement     Large Joint Inj: L knee on 07/06/2023 10:43 AM Indications: pain Details: 22 G needle, anterolateral approach Medications: 2 mL lidocaine 1 %; 2 mL bupivacaine 0.25 %; 40 mg methylPREDNISolone acetate 40 MG/ML        Assessment & Plan:  Chief Complaint:  Chief Complaint  Patient presents with   Left Knee - Routine Post Op   Visit Diagnoses:  1. Status post total left knee replacement     Plan: Patient is a pleasant 57 year old female who comes in today close to 3 months status post left total knee replacement 04/23/2023.  She has been in constant pain since surgery.  All of her pain is the medial aspect and is worse with physical therapy.  She has been taking Celebrex for pain.  No fevers or chills or any other constitutional symptoms.  Examination of her left knee reveals a well-healed surgical incision without complication.  She has tenderness to the medial joint line as well as over the pes bursa.  Range of motion from about 5 degrees of extension to 100 degrees of flexion.  She is stable to valgus and varus stress.  She is neurovascularly intact distally.  At this point, I am not concerned for infection.  Her implant is stable without any evidence of lucency on x-ray.  I believe some of her pain is coming from the pes bursa for which we will inject today with cortisone.  She is agreeable to this plan.  She will back off activity is much as possible and continue to ice as needed.  Continue with physical therapy.  With discussed that we would extend her out of work note for another 4 weeks.  She will follow-up with [place] in 3 months for  repeat evaluation.  She will call with concerns or questions in the meantime.  Follow-Up Instructions: Return in about 3 months (around 10/05/2023).   Orders:  Orders Placed This Encounter  Procedures   XR Knee 1-2 Views Left   No orders of the defined types were placed in this encounter.   Imaging: No results found.  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   Mastitis-left, medial aspect 03/15/2013   Past Medical History:  Diagnosis Date   High cholesterol    Inverted nipple left   abcess removed from areola, causing some scarring and inverted nipple    Family History  Problem Relation Age of Onset   Heart disease Brother     Past Surgical History:  Procedure Laterality Date   KNEE SURGERY     left- arthroscopy   TOTAL KNEE ARTHROPLASTY Left 04/23/2023   Procedure: LEFT TOTAL KNEE ARTHROPLASTY;  Surgeon: Tarry Kos, MD;  Location: [facility] OR;  Service: Orthopedics;  Laterality: Left;   Social History   Occupational History   Not on file  Tobacco Use   Smoking status: Never   Smokeless tobacco: Never  Vaping Use   Vaping status: Never Used  Substance and Sexual Activity   Alcohol use: No   Drug use: No   Sexual activity: Never

## 2023-07-07 ENCOUNTER — Ambulatory Visit
Admission: RE | Admit: 2023-07-07 | Discharge: 2023-07-07 | Disposition: A | Discharge status: Home / Self Care | Payer: PRIVATE HEALTH INSURANCE | Source: Ambulatory Visit | Attending: Physician Assistant | Admitting: Physician Assistant

## 2023-07-07 DIAGNOSIS — Z1231 Encounter for screening mammogram for malignant neoplasm of breast: Secondary | ICD-10-CM

## 2023-07-07 NOTE — Therapy (Signed)
OUTPATIENT PHYSICAL THERAPY LOWER EXTREMITY TREATMENT   Patient Name: Cheryl Bush MRN: [id number] DOB:05/15/1966, 57 y.o., female Today's Date: 07/07/2023  END OF SESSION:       Past Medical History:  Diagnosis Date   High cholesterol    Inverted nipple left   abcess removed from areola, causing some scarring and inverted nipple   Past Surgical History:  Procedure Laterality Date   KNEE SURGERY     left- arthroscopy   TOTAL KNEE ARTHROPLASTY Left 04/23/2023   Procedure: LEFT TOTAL KNEE ARTHROPLASTY;  Surgeon: Tarry Kos, MD;  Location: [facility] OR;  Service: Orthopedics;  Laterality: Left;   Patient Active Problem List   Diagnosis Date Noted   Status post total left knee replacement 04/23/2023   Primary osteoarthritis of right knee 07/28/2022   Primary osteoarthritis of left knee 07/28/2022   Leukopenia 09/07/2016   Mastitis-left, medial aspect 03/15/2013    PCP: Norm Salt, PA  REFERRING PROVIDER: Tarry Kos, MD  REFERRING DIAG: [phone] (ICD-10-CM) - S/P total knee arthroplasty, left  THERAPY DIAG:  No diagnosis found.  Rationale for Evaluation and Treatment: Rehabilitation  ONSET DATE: 04/23/23 L TKA   SUBJECTIVE:   SUBJECTIVE STATEMENT: ***  *** "I am having more pain on the inside of the knee. It feels like it will move or give away when I walk. I see Dr on Tuesday."     PERTINENT HISTORY: L TKA 04/23/23 PAIN:  Are you having pain: 9/10 ***  Location/description: Lt anterior knee /medial  - aggravating factors: standing/walking, typical household activities, difficulty sleeping  - Easing factors: icing, rest    PRECAUTIONS: None  WEIGHT BEARING RESTRICTIONS: No  FALLS:  Has patient fallen in last 6 months? No  LIVING ENVIRONMENT: 4 steps to enter home no rail, 1 level. Lives with her husband and her son (14 y.o). Pt typically does majority of housework.   OCCUPATION: working prior to surgery - on her feet a lot, has to go upstairs  and downstairs. Has to do lifting, variable weights  PLOF: Independent  PATIENT GOALS: get back to normal   NEXT MD VISIT: Friday (July 12), Aug 17, Sep 10  OBJECTIVE: (objective measures completed at initial evaluation unless otherwise dated)   DIAGNOSTIC FINDINGS:  S/p L TKA 04/23/23 Unremarkable post op DVT work up  PATIENT SURVEYS:  FOTO 1 current, 47 predicted 06/07/23: 25% function  07/02/23: 38%   COGNITION: Overall cognitive status: Within functional limits for tasks assessed     SENSATION: Denies sensory complaints  EDEMA:  Gross edema about L knee joint and bruising apparent - formal exam deferred given compression stocking/pt attire  MUSCLE LENGTH: deferred  POSTURE: tendency to hold knee in slightly bent position, anteriorly placed while sitting  PALPATION: Gross post surgical soreness as expected  LOWER EXTREMITY ROM:     Active  Right eval Left eval Left 05/13/23 05/17/23 05/24/23 05/26/23 05/31/23 06/09/23 07/02/23 Left 07/08/23 ***   Hip flexion            Hip extension            Hip internal rotation            Hip external rotation            Knee extension [facility] Lacking 5 deg           Knee flexion WFL 40 deg active supine   60 deg active seated  87 deg passive seated A 96 supine Left:90 Left:  100  106 100 113 120 AA   (Blank rows = not tested) (Key: WFL = within functional limits not formally assessed, * = concordant pain, s = stiffness/stretching sensation, NT = not tested)  Comments:    LOWER EXTREMITY MMT:    MMT Right eval Left eval Left 07/02/23  Hip flexion     Hip abduction (modified sitting)     Hip internal rotation     Hip external rotation     Knee flexion 5  4  Knee extension 5  4  Ankle dorsiflexion      (Blank rows = not tested) (Key: WFL = within functional limits not formally assessed, * = concordant pain, s = stiffness/stretching sensation, NT = not tested)  Comments: LLE NT due to pain    FUNCTIONAL TESTS:   TUG 42 sec  with RW ; 06/09/23: 16.6 without AD  06/16/23 TUG: 16 seconds with SPC    07/08/23:  TUG ***  GAIT: Distance walked: within clinic Assistive device utilized: Environmental consultant - 2 wheeled Level of assistance: Modified independence Comments: step to pattern, discontinuous gait, reduced WB through surgical limb   TODAY'S TREATMENT:     [facility] Adult PT Treatment:                                                DATE: 07/08/23 Therapeutic Exercise: *** Manual Therapy: *** Neuromuscular re-ed: *** Therapeutic Activity: *** Modalities: *** Self Care: ***    [facility] Adult PT Treatment:                                                DATE: 07/02/23 Therapeutic Exercise: Nustep L4 x 5 min H/s stretch supine with strap  QS SLR SAQ Heel slides with strap to 120   Therapeutic Activity: TUG 15.8 sec without AD Modalities: Vaso medium pressure, coldest setting x 15 minutes     [facility] Adult PT Treatment:                                                DATE: 06/25/23 Therapeutic Exercise: Rec Bike full revolutions  45# bilat x15, Left only 25# x15  Knee ext bilat 10# x 10,  Left only 5# x 10 Knee flexion 25# x 10 bilat  10# Sit -stand  STS with LLE back x 10 TKE with Blue band standing   Therapeutic Activity: Qped Lateral weight shifting and A/P rocking for desensitization and practice for kneeling to pray Kneel down onto AIREX pad with 1 UE assist on mat table , then rocking into childs pose for prayer simulation Modalities: Game ready x 15 minutes Lt knee, 38 degrees , medium compression   [facility] Adult PT Treatment:                                                DATE: 06/23/23 Therapeutic Exercise: Rec Bike full revolutions  Leg press 25# , 45# bilat  Knee ext 5#  x 10 Knee flexion 15# x 10  4 inch step down front 4 inch lateral step down Heel raise  gastroc stretch  SLR 2# x 10 SAQ 5# x 10 Manual Therapy: PROM knee flexion and ext   Modalities: Game ready x 10 minutes Lt knee, 38  degrees     [facility] Adult PT Treatment:                                                DATE: 06/16/23 Therapeutic Exercise: Recumbent bike no resistance full revolutions x 5 minutes  Sit to stand 1 x 10  Squats with UE support 2 x 10  Resisted knee extension 2 x 10 @ 5 lbs  Resisted HS curl 2 x 10 @ 10 lbs   Therapeutic Activity: Stair negotiation working on reciprocal step up with cane; step to with step down Modalities: Game ready x 10 minutes Lt knee, 38 degrees    [facility] Adult PT Treatment:                                                DATE: 06/14/23 Therapeutic Exercise: Recumbent bike no resistance full revolutions x 5 minutes  LAQ 2 x 10  Sit to stand 2 x 10 Step taps 8 inch step 2 x 10; RUE support  Step ups 4 inch and 6 inch 2 x 10; RUE support  Calf raises 2 x 10 Reviewed and updated HEP   Modalities: Game ready x 10 minutes Lt knee, 38 degrees         PATIENT EDUCATION:  Education details: HEP, progress with PT Person educated: Patient and spouse Education method: Explanation Education comprehension: verbalized understanding  HOME EXERCISE PROGRAM: Access Code: [email or web address] URL: [email or web address] Date: 05/05/2023 Prepared by: Fransisco Hertz  Exercises - Seated Knee Flexion AAROM  - 2-3 x daily - 7 x weekly - 1 sets - 10 reps - Seated Knee Extension AAROM  - 2-3 x daily - 7 x weekly - 1 sets - 10 reps - Seated Quad Set  - 2-3 x daily - 7 x weekly - 1 sets - 10 reps  ASSESSMENT:  CLINICAL IMPRESSION: 07/07/2023 ***  *** Patient arrives with 9/10 knee pain, increased antalgic pattern. She is 10 weeks S/P Left TKA.  Reports knee has been more painful over the last several days. She has been getting down to pray in prayer position 1 x per day and this could be the source of her increased pain. She feels like the medial knee shifts, has c/o this before and MD assured her that her prosthetic looks good on xrays. She will see MD Tuesday for f/u.   She continues to have edema in left leg, wears her compression stockings. Pt has grossly 4/5 knee strength and improved TUG and FOTO score. She has progressed well with ROM and function however struggles with continued pain and edema. Will see PT for re-assessment next visit and consider extension of POC after she sees MD next week.   OBJECTIVE IMPAIRMENTS: Abnormal gait, decreased activity tolerance, decreased endurance, decreased mobility, difficulty walking, decreased ROM, decreased strength, increased edema, improper body mechanics, postural dysfunction, and pain.   ACTIVITY LIMITATIONS: carrying, lifting, bending, sitting, standing, squatting, sleeping,  stairs, transfers, and locomotion level  PARTICIPATION LIMITATIONS: meal prep, cleaning, laundry, driving, shopping, community activity, and occupation  PERSONAL FACTORS: Time since onset of injury/illness/exacerbation are also affecting patient's functional outcome.   REHAB POTENTIAL: Good  CLINICAL DECISION MAKING: Stable/uncomplicated  EVALUATION COMPLEXITY: Low   GOALS: Goals reviewed with patient? No  SHORT TERM GOALS: Target date: 06/02/2023 Pt will demonstrate appropriate understanding and performance of initially prescribed HEP in order to facilitate improved independence with management of symptoms.  Baseline: HEP provided on eval 06/02/23: demonstrates understanding Goal status: MET  2. Pt will score greater than or equal to 24 on FOTO in order to demonstrate improved perception of function due to symptoms.  Baseline: 1  Goal status: MET  LONG TERM GOALS: Target date: 06/30/2023 Pt will score 47 or greater on FOTO in order to demonstrate improved perception of function due to symptoms.  Baseline: 1 07/02/23: 38% Goal status: ONGOING  2.  Pt will demonstrate at least 0-110 degrees of knee AROM in order to facilitate improved tolerance to functional movements such as walking/stairs.  Baseline: see ROM chart above Goal  status: MET  3.  Pt will demonstrate grossly symmetrical knee flex/extension MMT in order to facilitate improved functional strength.  Baseline: see MMT chart above 07/02/23: 4/5 left knee 07/08/23: ***  Goal status: ***  4.  Pt will be able to perform TUG in less than or equal to 14 sec with LRAD in order to indicate reduced risk of falling (cutoff score for fall risk 13.5 sec in community dwelling older adults per [facility] et al, 2000)  Baseline: 42 sec RW  06/09/23: 16.6 without AD  07/02/23: 15.8 sec without AD   07/08/23: ***   Goal status: ***  5. Pt will demonstrate appropriate performance of final prescribed HEP in order to facilitate improved self-management of symptoms post-discharge.   Baseline: initial HEP prescribed  07/08/23: ***   Goal status: ***    PLAN: updated 07/08/23 ***   PT FREQUENCY: 2x/week ***   PT DURATION: 8 weeks ***   PLANNED INTERVENTIONS: Therapeutic exercises, Therapeutic activity, Neuromuscular re-education, Balance training, Gait training, Patient/Family education, Self Care, Joint mobilization, Stair training, Aquatic Therapy, Dry Needling, Electrical stimulation, Cryotherapy, Moist heat, scar mobilization, Taping, Vasopneumatic device, Manual therapy, and Re-evaluation  PLAN FOR NEXT SESSION: Review/update HEP PRN. Knee ROM; gait training  sit to stands, quad strengthening. Stair training ***   Ashley Murrain PT, DPT 07/07/2023 10:08 AM

## 2023-07-08 ENCOUNTER — Encounter: Payer: Self-pay | Admitting: Physical Therapy

## 2023-07-08 ENCOUNTER — Ambulatory Visit: Payer: PRIVATE HEALTH INSURANCE | Admitting: Physical Therapy

## 2023-07-08 DIAGNOSIS — M25562 Pain in left knee: Secondary | ICD-10-CM | POA: Diagnosis not present

## 2023-07-08 DIAGNOSIS — M6281 Muscle weakness (generalized): Secondary | ICD-10-CM

## 2023-07-08 DIAGNOSIS — R600 Localized edema: Secondary | ICD-10-CM

## 2023-07-08 DIAGNOSIS — M25662 Stiffness of left knee, not elsewhere classified: Secondary | ICD-10-CM

## 2023-07-15 ENCOUNTER — Ambulatory Visit: Payer: PRIVATE HEALTH INSURANCE | Admitting: Physician Assistant

## 2023-07-21 ENCOUNTER — Ambulatory Visit: Payer: PRIVATE HEALTH INSURANCE

## 2023-07-21 DIAGNOSIS — R600 Localized edema: Secondary | ICD-10-CM

## 2023-07-21 DIAGNOSIS — M6281 Muscle weakness (generalized): Secondary | ICD-10-CM

## 2023-07-21 DIAGNOSIS — M25662 Stiffness of left knee, not elsewhere classified: Secondary | ICD-10-CM

## 2023-07-21 DIAGNOSIS — M25562 Pain in left knee: Secondary | ICD-10-CM

## 2023-07-21 NOTE — Therapy (Signed)
OUTPATIENT PHYSICAL THERAPY NOTE   Patient Name: Cheryl Bush MRN: [id number] DOB:[birth year], 57 y.o., female Today's Date: 07/21/2023     END OF SESSION:  PT End of Session - 07/21/23 1701     Visit Number 16    Number of Visits 18    Date for PT Re-Evaluation 08/19/23    Authorization Type [employer]    Authorization Time Period no auth til visit 40 per appt notes    PT Start Time 1702    PT Stop Time 1745    PT Time Calculation (min) 43 min    Activity Tolerance No increased pain;Patient limited by pain    Behavior During Therapy [facility] for tasks assessed/performed                  Past Medical History:  Diagnosis Date   High cholesterol    Inverted nipple left   abcess removed from areola, causing some scarring and inverted nipple   Past Surgical History:  Procedure Laterality Date   KNEE SURGERY     left- arthroscopy   TOTAL KNEE ARTHROPLASTY Left 04/23/2023   Procedure: LEFT TOTAL KNEE ARTHROPLASTY;  Surgeon: Tarry Kos, MD;  Location: [facility] OR;  Service: Orthopedics;  Laterality: Left;   Patient Active Problem List   Diagnosis Date Noted   Status post total left knee replacement 04/23/2023   Primary osteoarthritis of right knee 07/28/2022   Primary osteoarthritis of left knee 07/28/2022   Leukopenia 09/07/2016   Mastitis-left, medial aspect 03/15/2013    PCP: Norm Salt, PA  REFERRING PROVIDER: Tarry Kos, MD  REFERRING DIAG: [phone] (ICD-10-CM) - S/P total knee arthroplasty, left  THERAPY DIAG:  Left knee pain, unspecified chronicity  Stiffness of left knee, not elsewhere classified  Muscle weakness (generalized)  Localized edema  Rationale for Evaluation and Treatment: Rehabilitation  ONSET DATE: 04/23/23 L TKA   SUBJECTIVE:   SUBJECTIVE STATEMENT: Patient reporting that she is supposed to return to work on 08/03/23 and would like to hold off on PT until she follows up with her medical provider.    PERTINENT HISTORY: L TKA  04/23/23  PAIN:  Are you having pain: 9/10   Location/description: Lt anterior knee /medial  - aggravating factors: standing/walking, typical household activities, difficulty sleeping  - Easing factors: icing, rest    PRECAUTIONS: None  WEIGHT BEARING RESTRICTIONS: No  FALLS:  Has patient fallen in last 6 months? No  LIVING ENVIRONMENT: 4 steps to enter home no rail, 1 level. Lives with her husband and her son (22 y.o). Pt typically does majority of housework.   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 Has to do lifting, variable weights  PLOF: Independent  PATIENT GOALS: get back to normal   NEXT MD VISIT: Friday (July 12), Aug 17, Sep 10  OBJECTIVE: (objective measures completed at initial evaluation unless otherwise dated)   DIAGNOSTIC FINDINGS:  S/p L TKA 04/23/23 Unremarkable post op DVT work up  PATIENT SURVEYS:  FOTO 1 current, 47 predicted 06/07/23: 25% function  07/02/23: 38%   COGNITION: Overall cognitive status: Within functional limits for tasks assessed     SENSATION: Denies sensory complaints  EDEMA:  Gross edema about L knee joint and bruising apparent - formal exam deferred given compression stocking/pt attire  MUSCLE LENGTH: deferred  POSTURE: tendency to hold knee in slightly bent position, anteriorly placed while sitting  PALPATION: Gross post surgical soreness as expected  LOWER EXTREMITY ROM:     Active  Right eval Left eval Left 05/13/23 05/17/23 05/24/23 05/26/23 05/31/23 06/09/23 07/02/23 Left 07/08/23    Hip flexion            Hip extension            Hip internal rotation            Hip external rotation            Knee extension [facility] Lacking 5 deg         Lacking 4 deg   Knee flexion WFL 40 deg active supine   60 deg active seated  87 deg passive seated A 96 supine Left:90 Left: 100  106 100 113 120 AA A: 119 deg  (Blank rows = not tested) (Key: WFL = within functional limits not  formally assessed, * = concordant pain, s = stiffness/stretching sensation, NT = not tested)  Comments:    LOWER EXTREMITY MMT:    MMT Right eval Left eval Left 07/02/23 R/L 07/08/23  Hip flexion      Hip abduction (modified sitting)      Hip internal rotation      Hip external rotation      Knee flexion 5  4 5/4+  Knee extension 5  4 5/4+  Ankle dorsiflexion       (Blank rows = not tested) (Key: WFL = within functional limits not formally assessed, * = concordant pain, s = stiffness/stretching sensation, NT = not tested)  Comments: LLE NT due to pain    FUNCTIONAL TESTS:   TUG 42 sec with RW ; 06/09/23: 16.6 without AD  06/16/23 TUG: 16 seconds with SPC    07/08/23:  TUG 11.68sec without AD 5xSTS 21.53 sec no UE support standard chair  GAIT: Distance walked: within clinic Assistive device utilized: Environmental consultant - 2 wheeled Level of assistance: Modified independence Comments: step to pattern, discontinuous gait, reduced WB through surgical limb   TODAY'S TREATMENT:     [facility] Adult PT Treatment:                                                DATE: 07/21/2023  Therapeutic Exercise: Nustep L4 x 5 min Seated knee flexion with green TB LAQ with 2# ankle weight Standing hamstring curls, 2# x10 LLE focus on controlled ROM and pacing Standing heel/toe raises, 2# x 20  Modalities: Vaso medium pressure, 34 deg, 10 min L knee w/ LE elevated; good relief no adverse events    [facility] Adult PT Treatment:                                                DATE: 07/08/23 Therapeutic Exercise: SR x10 LLE Standing hamstring curls, BW x10 LLE focus on controlled ROM and pacing Standing heel/toe raises x10  HEP update + education  Therapeutic Activity: TUG + education MSK assessment + education 5xSTS + education Significant time with education/discussion re: progress with PT, symptom behavior as it affects activity tolerance, PT goals/POC, activity modification strategies  Modalities: Vaso  medium pressure, 34 deg, 10 min L knee w/ LE elevated; good relief no adverse events   [facility] Adult PT Treatment:  DATE: 07/02/23 Therapeutic Exercise: Nustep L4 x 5 min H/s stretch supine with strap  QS SLR SAQ Heel slides with strap to 120   Therapeutic Activity: TUG 15.8 sec without AD Modalities: Vaso medium pressure, coldest setting x 15 minutes     [facility] Adult PT Treatment:                                                DATE: 06/25/23 Therapeutic Exercise: Rec Bike full revolutions  45# bilat x15, Left only 25# x15  Knee ext bilat 10# x 10,  Left only 5# x 10 Knee flexion 25# x 10 bilat  10# Sit -stand  STS with LLE back x 10 TKE with Blue band standing   Therapeutic Activity: Qped Lateral weight shifting and A/P rocking for desensitization and practice for kneeling to pray Kneel down onto AIREX pad with 1 UE assist on mat table , then rocking into childs pose for prayer simulation Modalities: Game ready x 15 minutes Lt knee, 38 degrees , medium compression   [facility] Adult PT Treatment:                                                DATE: 06/23/23 Therapeutic Exercise: Rec Bike full revolutions  Leg press 25# , 45# bilat  Knee ext 5# x 10 Knee flexion 15# x 10  4 inch step down front 4 inch lateral step down Heel raise  gastroc stretch  SLR 2# x 10 SAQ 5# x 10 Manual Therapy: PROM knee flexion and ext   Modalities: Game ready x 10 minutes Lt knee, 38 degrees     [facility] Adult PT Treatment:                                                DATE: 06/16/23 Therapeutic Exercise: Recumbent bike no resistance full revolutions x 5 minutes  Sit to stand 1 x 10  Squats with UE support 2 x 10  Resisted knee extension 2 x 10 @ 5 lbs  Resisted HS curl 2 x 10 @ 10 lbs   Therapeutic Activity: Stair negotiation working on reciprocal step up with cane; step to with step down Modalities: Game ready x 10 minutes Lt knee, 38 degrees     [facility] Adult PT Treatment:                                                DATE: 06/14/23 Therapeutic Exercise: Recumbent bike no resistance full revolutions x 5 minutes  LAQ 2 x 10  Sit to stand 2 x 10 Step taps 8 inch step 2 x 10; RUE support  Step ups 4 inch and 6 inch 2 x 10; RUE support  Calf raises 2 x 10 Reviewed and updated HEP   Modalities: Game ready x 10 minutes Lt knee, 38 degrees    PATIENT EDUCATION:  Education details: HEP, progress with PT, PT goals/POC, activity modification strategies Person educated: Patient  and spouse Education method: Explanation Education comprehension: verbalized understanding  HOME EXERCISE PROGRAM: Access Code: [email or web address] URL: [email or web address] Date: 07/08/2023 Prepared by: Fransisco Hertz  Exercises - Supine Active Straight Leg Raise  - 2-3 x daily - 7 x weekly - 1 sets - 10 reps - Standing Knee Flexion with Counter Support  - 2-3 x daily - 7 x weekly - 1 sets - 10 reps - Sit to Stand Without Arm Support  - 2-3 x daily - 7 x weekly - 1 sets - 10 reps - Heel Toe Raises with Counter Support  - 2-3 x daily - 7 x weekly - 1 sets - 10 reps  ASSESSMENT:  CLINICAL IMPRESSION: 07/21/2023 Patient had some pain with resisted knee flexion today. However, she was able to tolerate resisted hamstring curls and standing heel toe raises. She will benefit from ongoing LE strengthening, including progression towards functional activities, including kneeling tolerance. However, we will hold from PT until patient next follow up with medical provider.    OBJECTIVE IMPAIRMENTS: Abnormal gait, decreased activity tolerance, decreased endurance, decreased mobility, difficulty walking, decreased ROM, decreased strength, increased edema, improper body mechanics, postural dysfunction, and pain.   ACTIVITY LIMITATIONS: carrying, lifting, bending, sitting, standing, squatting, sleeping, stairs, transfers, and locomotion level  PARTICIPATION  LIMITATIONS: meal prep, cleaning, laundry, driving, shopping, community activity, and occupation  PERSONAL FACTORS: Time since onset of injury/illness/exacerbation are also affecting patient's functional outcome.   REHAB POTENTIAL: Good  CLINICAL DECISION MAKING: Stable/uncomplicated  EVALUATION COMPLEXITY: Low   GOALS: Goals reviewed with patient? No  SHORT TERM GOALS: Target date: 06/02/2023 Pt will demonstrate appropriate understanding and performance of initially prescribed HEP in order to facilitate improved independence with management of symptoms.  Baseline: HEP provided on eval 06/02/23: demonstrates understanding Goal status: MET  2. Pt will score greater than or equal to 24 on FOTO in order to demonstrate improved perception of function due to symptoms.  Baseline: 1  Goal status: MET  LONG TERM GOALS: Target date: 08/19/2023  (Updated 07/08/23) Pt will score 47 or greater on FOTO in order to demonstrate improved perception of function due to symptoms.  Baseline: 1 07/02/23: 38% Goal status: ONGOING  2.  Pt will demonstrate at least 0-110 degrees of knee AROM in order to facilitate improved tolerance to functional movements such as walking/stairs.  Baseline: see ROM chart above Goal status: MET  3.  Pt will demonstrate grossly symmetrical knee flex/extension MMT in order to facilitate improved functional strength.  Baseline: see MMT chart above 07/02/23: 4/5 left knee 07/08/23: 4+/5 L knee  Goal status: ONGOING  4.  Pt will be able to perform TUG in less than or equal to 14 sec with LRAD in order to indicate reduced risk of falling (cutoff score for fall risk 13.5 sec in community dwelling older adults per [facility] et al, 2000)  Baseline: 42 sec RW  06/09/23: 16.6 without AD  07/02/23: 15.8 sec without AD   07/08/23: 11 sec no AD   Goal status: MET  5. Pt will demonstrate appropriate performance of final prescribed HEP in order to facilitate improved self-management of  symptoms post-discharge.   Baseline: initial HEP prescribed  07/08/23: reports good adherence with HEP   Goal status: ONGOING   6. Pt will be able to perform 5xSTS in <14 sec without UE support in order to reflect reduced fall risk and improved functional mobility  Baseline: 21 sec  Goal status: NEW 07/08/23   PLAN: updated 07/08/23  PT FREQUENCY: every other week  PT DURATION: 6 weeks  PLANNED INTERVENTIONS: Therapeutic exercises, Therapeutic activity, Neuromuscular re-education, Balance training, Gait training, Patient/Family education, Self Care, Joint mobilization, Stair training, Aquatic Therapy, Dry Needling, Electrical stimulation, Cryotherapy, Moist heat, scar mobilization, Taping, Vasopneumatic device, Manual therapy, and Re-evaluation  PLAN FOR NEXT SESSION: Review/update HEP PRN. Activity modification strategies. Comfortable knee mobility into flexion, emphasis on hamstring control.   Mauri Reading, PT, DPT  07/21/2023 7:22 PM

## 2023-07-23 ENCOUNTER — Telehealth: Payer: Self-pay | Admitting: Physician Assistant

## 2023-07-23 NOTE — Telephone Encounter (Signed)
Ok to be out two more weeks and then return to work light duty and to work no more than 6 hrs/day until her return appointment with [place]

## 2023-07-23 NOTE — Telephone Encounter (Signed)
New work note created and placed up front for pick up.

## 2023-07-23 NOTE — Telephone Encounter (Signed)
Pt came in ask for additional 2 weeks off work starting from last out of work note ending 10/9. Also pt is asking for letter to say light duty when return to work. Pt states it a lot of walking, and standing that she can't. Pt also asked to add for her to work 6 hours a day, Please call pt at [phone].

## 2023-07-26 ENCOUNTER — Telehealth: Payer: Self-pay | Admitting: Orthopaedic Surgery

## 2023-07-26 NOTE — Telephone Encounter (Signed)
[employer] forms received. To [employer]. 

## 2023-08-03 ENCOUNTER — Telehealth: Payer: Self-pay | Admitting: Orthopaedic Surgery

## 2023-08-03 NOTE — Telephone Encounter (Signed)
Pt needs work note to be more specific and needs Light duty specified, pt is not comfortable lifting more than 5 lbs, not comfortable with climbing stairs or standing more than a hour at a time and no walking more than 2 hours at a time. Would like to be able to sit due to pain in knee please advise

## 2023-08-03 NOTE — Telephone Encounter (Signed)
We can give her this restriction for 6 months postop

## 2023-08-03 NOTE — Telephone Encounter (Signed)
Please advise 

## 2023-08-03 NOTE — Telephone Encounter (Signed)
Note has been written. Patient notified and will pick up tomorrow.

## 2023-08-04 ENCOUNTER — Telehealth: Payer: Self-pay | Admitting: Orthopaedic Surgery

## 2023-08-04 NOTE — Therapy (Signed)
OUTPATIENT PHYSICAL THERAPY NOTE   Patient Name: Cheryl Bush MRN: [id number] DOB:1966-08-11, 57 y.o., female Today's Date: 08/04/2023     END OF SESSION:         Past Medical History:  Diagnosis Date   High cholesterol    Inverted nipple left   abcess removed from areola, causing some scarring and inverted nipple   Past Surgical History:  Procedure Laterality Date   KNEE SURGERY     left- arthroscopy   TOTAL KNEE ARTHROPLASTY Left 04/23/2023   Procedure: LEFT TOTAL KNEE ARTHROPLASTY;  Surgeon: Tarry Kos, MD;  Location: [facility] OR;  Service: Orthopedics;  Laterality: Left;   Patient Active Problem List   Diagnosis Date Noted   Status post total left knee replacement 04/23/2023   Primary osteoarthritis of right knee 07/28/2022   Primary osteoarthritis of left knee 07/28/2022   Leukopenia 09/07/2016   Mastitis-left, medial aspect 03/15/2013    PCP: Norm Salt, PA  REFERRING PROVIDER: Tarry Kos, MD  REFERRING DIAG: [phone] (ICD-10-CM) - S/P total knee arthroplasty, left  THERAPY DIAG:  No diagnosis found.  Rationale for Evaluation and Treatment: Rehabilitation  ONSET DATE: 04/23/23 L TKA   SUBJECTIVE:   SUBJECTIVE STATEMENT: ***  Patient reporting that she is supposed to return to work on 08/03/23 and would like to hold off on PT until she follows up with her medical provider.    PERTINENT HISTORY: L TKA 04/23/23  PAIN:  Are you having pain: 9/10   Location/description: Lt anterior knee /medial  - aggravating factors: standing/walking, typical household activities, difficulty sleeping  - Easing factors: icing, rest    PRECAUTIONS: None  WEIGHT BEARING RESTRICTIONS: No  FALLS:  Has patient fallen in last 6 months? No  LIVING ENVIRONMENT: 4 steps to enter home no rail, 1 level. Lives with her husband and her son (27 y.o). Pt typically does majority of housework.   OCCUPATION: working prior to surgery - on her feet a lot, has to go  upstairs and downstairs. Has to do lifting, variable weights  PLOF: Independent  PATIENT GOALS: get back to normal   NEXT MD VISIT: Friday (July 12), Aug 17, Sep 10  OBJECTIVE: (objective measures completed at initial evaluation unless otherwise dated)   DIAGNOSTIC FINDINGS:  S/p L TKA 04/23/23 Unremarkable post op DVT work up  PATIENT SURVEYS:  FOTO 1 current, 47 predicted 06/07/23: 25% function  07/02/23: 38%   COGNITION: Overall cognitive status: Within functional limits for tasks assessed     SENSATION: Denies sensory complaints  EDEMA:  Gross edema about L knee joint and bruising apparent - formal exam deferred given compression stocking/pt attire  MUSCLE LENGTH: deferred  POSTURE: tendency to hold knee in slightly bent position, anteriorly placed while sitting  PALPATION: Gross post surgical soreness as expected  LOWER EXTREMITY ROM:     Active  Right eval Left eval Left 05/13/23 05/17/23 05/24/23 05/26/23 05/31/23 06/09/23 07/02/23 Left 07/08/23    Hip flexion            Hip extension            Hip internal rotation            Hip external rotation            Knee extension [facility] Lacking 5 deg         Lacking 4 deg   Knee flexion WFL 40 deg active supine   60 deg active seated  87 deg passive seated  A 96 supine Left:90 Left: 100  106 100 113 120 AA A: 119 deg  (Blank rows = not tested) (Key: WFL = within functional limits not formally assessed, * = concordant pain, s = stiffness/stretching sensation, NT = not tested)  Comments:    LOWER EXTREMITY MMT:    MMT Right eval Left eval Left 07/02/23 R/L 07/08/23  Hip flexion      Hip abduction (modified sitting)      Hip internal rotation      Hip external rotation      Knee flexion 5  4 5/4+  Knee extension 5  4 5/4+  Ankle dorsiflexion       (Blank rows = not tested) (Key: WFL = within functional limits not formally assessed, * = concordant pain, s = stiffness/stretching sensation, NT = not tested)  Comments:  LLE NT due to pain    FUNCTIONAL TESTS:   TUG 42 sec with RW ; 06/09/23: 16.6 without AD  06/16/23 TUG: 16 seconds with SPC    07/08/23:  TUG 11.68sec without AD 5xSTS 21.53 sec no UE support standard chair  GAIT: Distance walked: within clinic Assistive device utilized: Environmental consultant - 2 wheeled Level of assistance: Modified independence Comments: step to pattern, discontinuous gait, reduced WB through surgical limb   TODAY'S TREATMENT:     [facility] Adult PT Treatment:                                                DATE: 08/04/2023  Therapeutic Exercise: Nustep L4 x 5 min *** Seated knee flexion with green TB LAQ with 2# ankle weight Standing hamstring curls, 2# x10 LLE focus on controlled ROM and pacing Standing heel/toe raises, 2# x 20  Modalities: Vaso medium pressure, 34 deg, 10 min L knee w/ LE elevated; good relief no adverse events   [facility] Adult PT Treatment:                                                DATE: 07/21/2023  Therapeutic Exercise: Nustep L4 x 5 min Seated knee flexion with green TB LAQ with 2# ankle weight Standing hamstring curls, 2# x10 LLE focus on controlled ROM and pacing Standing heel/toe raises, 2# x 20  Modalities: Vaso medium pressure, 34 deg, 10 min L knee w/ LE elevated; good relief no adverse events    [facility] Adult PT Treatment:                                                DATE: 07/08/23 Therapeutic Exercise: SR x10 LLE Standing hamstring curls, BW x10 LLE focus on controlled ROM and pacing Standing heel/toe raises x10  HEP update + education  Therapeutic Activity: TUG + education MSK assessment + education 5xSTS + education Significant time with education/discussion re: progress with PT, symptom behavior as it affects activity tolerance, PT goals/POC, activity modification strategies  Modalities: Vaso medium pressure, 34 deg, 10 min L knee w/ LE elevated; good relief no adverse events   [facility] Adult PT Treatment:  DATE: 07/02/23 Therapeutic Exercise: Nustep L4 x 5 min H/s stretch supine with strap  QS SLR SAQ Heel slides with strap to 120   Therapeutic Activity: TUG 15.8 sec without AD Modalities: Vaso medium pressure, coldest setting x 15 minutes     [facility] Adult PT Treatment:                                                DATE: 06/25/23 Therapeutic Exercise: Rec Bike full revolutions  45# bilat x15, Left only 25# x15  Knee ext bilat 10# x 10,  Left only 5# x 10 Knee flexion 25# x 10 bilat  10# Sit -stand  STS with LLE back x 10 TKE with Blue band standing   Therapeutic Activity: Qped Lateral weight shifting and A/P rocking for desensitization and practice for kneeling to pray Kneel down onto AIREX pad with 1 UE assist on mat table , then rocking into childs pose for prayer simulation Modalities: Game ready x 15 minutes Lt knee, 38 degrees , medium compression   [facility] Adult PT Treatment:                                                DATE: 06/23/23 Therapeutic Exercise: Rec Bike full revolutions  Leg press 25# , 45# bilat  Knee ext 5# x 10 Knee flexion 15# x 10  4 inch step down front 4 inch lateral step down Heel raise  gastroc stretch  SLR 2# x 10 SAQ 5# x 10 Manual Therapy: PROM knee flexion and ext   Modalities: Game ready x 10 minutes Lt knee, 38 degrees     [facility] Adult PT Treatment:                                                DATE: 06/16/23 Therapeutic Exercise: Recumbent bike no resistance full revolutions x 5 minutes  Sit to stand 1 x 10  Squats with UE support 2 x 10  Resisted knee extension 2 x 10 @ 5 lbs  Resisted HS curl 2 x 10 @ 10 lbs   Therapeutic Activity: Stair negotiation working on reciprocal step up with cane; step to with step down Modalities: Game ready x 10 minutes Lt knee, 38 degrees    [facility] Adult PT Treatment:                                                DATE: 06/14/23 Therapeutic Exercise: Recumbent bike no  resistance full revolutions x 5 minutes  LAQ 2 x 10  Sit to stand 2 x 10 Step taps 8 inch step 2 x 10; RUE support  Step ups 4 inch and 6 inch 2 x 10; RUE support  Calf raises 2 x 10 Reviewed and updated HEP   Modalities: Game ready x 10 minutes Lt knee, 38 degrees    PATIENT EDUCATION:  Education details: HEP, progress with PT, PT goals/POC, activity modification strategies Person educated: Patient  and spouse Education method: Explanation Education comprehension: verbalized understanding  HOME EXERCISE PROGRAM: Access Code: [email or web address] URL: [email or web address] Date: 07/08/2023 Prepared by: Fransisco Hertz  Exercises - Supine Active Straight Leg Raise  - 2-3 x daily - 7 x weekly - 1 sets - 10 reps - Standing Knee Flexion with Counter Support  - 2-3 x daily - 7 x weekly - 1 sets - 10 reps - Sit to Stand Without Arm Support  - 2-3 x daily - 7 x weekly - 1 sets - 10 reps - Heel Toe Raises with Counter Support  - 2-3 x daily - 7 x weekly - 1 sets - 10 reps  ASSESSMENT:  CLINICAL IMPRESSION: 08/04/2023 ***  Patient had some pain with resisted knee flexion today. However, she was able to tolerate resisted hamstring curls and standing heel toe raises. She will benefit from ongoing LE strengthening, including progression towards functional activities, including kneeling tolerance. However, we will hold from PT until patient next follow up with medical provider.    OBJECTIVE IMPAIRMENTS: Abnormal gait, decreased activity tolerance, decreased endurance, decreased mobility, difficulty walking, decreased ROM, decreased strength, increased edema, improper body mechanics, postural dysfunction, and pain.   ACTIVITY LIMITATIONS: carrying, lifting, bending, sitting, standing, squatting, sleeping, stairs, transfers, and locomotion level  PARTICIPATION LIMITATIONS: meal prep, cleaning, laundry, driving, shopping, community activity, and occupation  PERSONAL FACTORS: Time since  onset of injury/illness/exacerbation are also affecting patient's functional outcome.   REHAB POTENTIAL: Good  CLINICAL DECISION MAKING: Stable/uncomplicated  EVALUATION COMPLEXITY: Low   GOALS: Goals reviewed with patient? No  SHORT TERM GOALS: Target date: 06/02/2023 Pt will demonstrate appropriate understanding and performance of initially prescribed HEP in order to facilitate improved independence with management of symptoms.  Baseline: HEP provided on eval 06/02/23: demonstrates understanding Goal status: MET  2. Pt will score greater than or equal to 24 on FOTO in order to demonstrate improved perception of function due to symptoms.  Baseline: 1  Goal status: MET  LONG TERM GOALS: Target date: 08/19/2023  (Updated 07/08/23) Pt will score 47 or greater on FOTO in order to demonstrate improved perception of function due to symptoms.  Baseline: 1 07/02/23: 38% Goal status: ONGOING  2.  Pt will demonstrate at least 0-110 degrees of knee AROM in order to facilitate improved tolerance to functional movements such as walking/stairs.  Baseline: see ROM chart above Goal status: MET  3.  Pt will demonstrate grossly symmetrical knee flex/extension MMT in order to facilitate improved functional strength.  Baseline: see MMT chart above 07/02/23: 4/5 left knee 07/08/23: 4+/5 L knee  Goal status: ONGOING  4.  Pt will be able to perform TUG in less than or equal to 14 sec with LRAD in order to indicate reduced risk of falling (cutoff score for fall risk 13.5 sec in community dwelling older adults per [facility] et al, 2000)  Baseline: 42 sec RW  06/09/23: 16.6 without AD  07/02/23: 15.8 sec without AD   07/08/23: 11 sec no AD   Goal status: MET  5. Pt will demonstrate appropriate performance of final prescribed HEP in order to facilitate improved self-management of symptoms post-discharge.   Baseline: initial HEP prescribed  07/08/23: reports good adherence with HEP   Goal status: ONGOING    6. Pt will be able to perform 5xSTS in <14 sec without UE support in order to reflect reduced fall risk and improved functional mobility  Baseline: 21 sec  Goal status: NEW 07/08/23   PLAN: updated 07/08/23  PT FREQUENCY: every other week  PT DURATION: 6 weeks  PLANNED INTERVENTIONS: Therapeutic exercises, Therapeutic activity, Neuromuscular re-education, Balance training, Gait training, Patient/Family education, Self Care, Joint mobilization, Stair training, Aquatic Therapy, Dry Needling, Electrical stimulation, Cryotherapy, Moist heat, scar mobilization, Taping, Vasopneumatic device, Manual therapy, and Re-evaluation  PLAN FOR NEXT SESSION: Review/update HEP PRN. Activity modification strategies. Comfortable knee mobility into flexion, emphasis on hamstring control.   Mauri Reading, PT, DPT  08/04/2023 2:27 PM

## 2023-08-04 NOTE — Telephone Encounter (Signed)
Sent Ryan a message via email. Patient states that her ice machine has not been working for about a month now. Gave information to [place] for contacting patient.

## 2023-08-04 NOTE — Telephone Encounter (Signed)
Pt wanted a call about an Ice machine please advise

## 2023-08-05 ENCOUNTER — Ambulatory Visit: Payer: PRIVATE HEALTH INSURANCE | Attending: Orthopaedic Surgery

## 2023-08-05 DIAGNOSIS — M25662 Stiffness of left knee, not elsewhere classified: Secondary | ICD-10-CM | POA: Insufficient documentation

## 2023-08-05 DIAGNOSIS — R2689 Other abnormalities of gait and mobility: Secondary | ICD-10-CM | POA: Insufficient documentation

## 2023-08-05 DIAGNOSIS — M25562 Pain in left knee: Secondary | ICD-10-CM | POA: Insufficient documentation

## 2023-08-05 DIAGNOSIS — R600 Localized edema: Secondary | ICD-10-CM | POA: Diagnosis present

## 2023-08-05 DIAGNOSIS — M6281 Muscle weakness (generalized): Secondary | ICD-10-CM | POA: Insufficient documentation

## 2023-08-19 ENCOUNTER — Ambulatory Visit: Payer: PRIVATE HEALTH INSURANCE | Admitting: Physical Therapy

## 2023-08-19 ENCOUNTER — Encounter: Payer: Self-pay | Admitting: Physical Therapy

## 2023-08-19 DIAGNOSIS — M25662 Stiffness of left knee, not elsewhere classified: Secondary | ICD-10-CM

## 2023-08-19 DIAGNOSIS — M25562 Pain in left knee: Secondary | ICD-10-CM

## 2023-08-19 DIAGNOSIS — R600 Localized edema: Secondary | ICD-10-CM

## 2023-08-19 DIAGNOSIS — M6281 Muscle weakness (generalized): Secondary | ICD-10-CM

## 2023-08-19 DIAGNOSIS — R2689 Other abnormalities of gait and mobility: Secondary | ICD-10-CM

## 2023-08-19 NOTE — Therapy (Signed)
OUTPATIENT PHYSICAL THERAPY NOTE + DISCHARGE   Patient Name: Cheryl Bush MRN: [id number] DOB:1966/05/05, 57 y.o., female Today's Date: 08/19/2023   PHYSICAL THERAPY DISCHARGE SUMMARY  Visits from Start of Care: 18  Current functional level related to goals / functional outcomes: Increased pain with basic mobility and daily activities   Remaining deficits: Pain, swelling, weakness   Education / Equipment: HEP, activity modification, follow up with provider   Patient agrees to discharge. Patient goals were partially met. Patient is being discharged due to lack of progress.      END OF SESSION:  PT End of Session - 08/19/23 1617     Visit Number 18    Number of Visits 18    Date for PT Re-Evaluation 08/19/23    Authorization Type [employer]    Authorization Time Period no auth til visit 40 per appt notes    PT Start Time 1617    PT Stop Time 1710   vaso end of session   PT Time Calculation (min) 53 min    Activity Tolerance No increased pain;Patient limited by pain    Behavior During Therapy [facility] for tasks assessed/performed                    Past Medical History:  Diagnosis Date   High cholesterol    Inverted nipple left   abcess removed from areola, causing some scarring and inverted nipple   Past Surgical History:  Procedure Laterality Date   KNEE SURGERY     left- arthroscopy   TOTAL KNEE ARTHROPLASTY Left 04/23/2023   Procedure: LEFT TOTAL KNEE ARTHROPLASTY;  Surgeon: Tarry Kos, MD;  Location: [facility] OR;  Service: Orthopedics;  Laterality: Left;   Patient Active Problem List   Diagnosis Date Noted   Status post total left knee replacement 04/23/2023   Primary osteoarthritis of right knee 07/28/2022   Primary osteoarthritis of left knee 07/28/2022   Leukopenia 09/07/2016   Mastitis-left, medial aspect 03/15/2013    PCP: Norm Salt, PA  REFERRING PROVIDER: Tarry Kos, MD  REFERRING DIAG: [phone] (ICD-10-CM) - S/P total  knee arthroplasty, left  THERAPY DIAG:  Left knee pain, unspecified chronicity  Stiffness of left knee, not elsewhere classified  Muscle weakness (generalized)  Localized edema  Other abnormalities of gait and mobility  Rationale for Evaluation and Treatment: Rehabilitation  ONSET DATE: 04/23/23 L TKA   SUBJECTIVE:   SUBJECTIVE STATEMENT: Pt arrives and continues to endorse significant pain/swelling that seems to fluctuate with activity. Denies notable progress with PT over last few visits, agreeable to discharge to independent HEP and provider follow up.  Accompanied by her son today per her request.     PERTINENT HISTORY: L TKA 04/23/23  PAIN:  Are you having pain: unrated L knee Location/description: Lt anterior knee /medial  - aggravating factors: standing/walking, typical household activities, difficulty sleeping  - Easing factors: icing, rest    PRECAUTIONS: None  WEIGHT BEARING RESTRICTIONS: No  FALLS:  Has patient fallen in last 6 months? No  LIVING ENVIRONMENT: 4 steps to enter home no rail, 1 level. Lives with her husband and her son (39 y.o). Pt typically does majority of housework.   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 Has to do lifting, variable weights  PLOF: Independent  PATIENT GOALS: get back to normal   NEXT MD VISIT: Friday (July 12), Aug 17, Sep 10  OBJECTIVE: (objective measures  completed at initial evaluation unless otherwise dated)   DIAGNOSTIC FINDINGS:  S/p L TKA 04/23/23 Unremarkable post op DVT work up  PATIENT SURVEYS:  FOTO 1 current, 47 predicted 06/07/23: 25% function  07/02/23: 38%  08/19/23 41 %  COGNITION: Overall cognitive status: Within functional limits for tasks assessed     SENSATION: Denies sensory complaints  EDEMA:  Gross edema about L knee joint and bruising apparent - formal exam deferred given compression stocking/pt attire   08/19/23:  R knee at  joint line 40.2cm L knee at joint line 42.2cm  MUSCLE LENGTH: deferred  POSTURE: tendency to hold knee in slightly bent position, anteriorly placed while sitting  PALPATION: Gross post surgical soreness as expected  LOWER EXTREMITY ROM:     Active  Right eval Left eval Left 05/13/23 05/17/23 05/24/23 05/26/23 05/31/23 06/09/23 07/02/23 Left 07/08/23   Left 08/19/23  Hip flexion             Hip extension             Hip internal rotation             Hip external rotation             Knee extension [facility] Lacking 5 deg         Lacking 4 deg  Lacking 2 deg painful  Knee flexion WFL 40 deg active supine   60 deg active seated  87 deg passive seated A 96 supine Left:90 Left: 100  106 100 113 120 AA A: 119 deg A: 110 deg P: 118 deg  (Blank rows = not tested) (Key: WFL = within functional limits not formally assessed, * = concordant pain, s = stiffness/stretching sensation, NT = not tested)  Comments:    LOWER EXTREMITY MMT:    MMT Right eval Left eval Left 07/02/23 R/L 07/08/23 R/L 08/19/23  Hip flexion       Hip abduction (modified sitting)       Hip internal rotation       Hip external rotation       Knee flexion 5  4 5/4+ 5/4+ *  Knee extension 5  4 5/4+ 5/5  Ankle dorsiflexion        (Blank rows = not tested) (Key: WFL = within functional limits not formally assessed, * = concordant pain, s = stiffness/stretching sensation, NT = not tested)  Comments: LLE NT due to pain    FUNCTIONAL TESTS:   TUG 42 sec with RW ; 06/09/23: 16.6 without AD  06/16/23 TUG: 16 seconds with SPC    07/08/23:  TUG 11.68sec without AD 5xSTS 21.53 sec no UE support standard chair   08/19/23  - 5xSTS 19 sec no UE support   GAIT: Distance walked: within clinic Assistive device utilized: Environmental consultant - 2 wheeled Level of assistance: Modified independence Comments: step to pattern, discontinuous gait, reduced WB through surgical limb   TODAY'S TREATMENT:    [facility] Adult PT Treatment:                                                 DATE: 08/19/23 Therapeutic Exercise: Standing heel toe raises x10 Standing knee flexion AROM x10 STS x5 SLR x10 LLE  HEP handout + education, extensive time spent discussing HEP and strategies to improve comfort/tolerance  Therapeutic Activity: MSK assessment + education FOTO +  education 5xSTS + education Extensive time spent discussing progress with PT over past few visits and since start of care, continued pain/swelling as it affects activity tolerance and functional mobility, monitoring symptoms and activity modification, strategies to improve safety/comfort with stair navigation, discharge education, and follow up with provider  Modalities: Vaso medium pressure, 34 deg, L knee w/ LE elevated    PATIENT EDUCATION:  Education details: HEP, progress with PT, PT goals/POC, activity modification strategies, discharge education Person educated: Patient and son Education method: Explanation Education comprehension: verbalized understanding  HOME EXERCISE PROGRAM: Access Code: [email or web address] URL: [email or web address] Date: 08/19/2023 Prepared by: Fransisco Hertz  Exercises - Supine Active Straight Leg Raise  - 2-3 x daily - 7 x weekly - 1 sets - 10 reps - Standing Knee Flexion with Counter Support  - 2-3 x daily - 7 x weekly - 1 sets - 10 reps - Sit to Stand Without Arm Support  - 2-3 x daily - 7 x weekly - 1 sets - 10 reps - Heel Toe Raises with Counter Support  - 2-3 x daily - 7 x weekly - 1 sets - 10 reps  ASSESSMENT:  CLINICAL IMPRESSION: 08/19/2023 Pt arrives and continues to endorse swelling/pain about the same, denies recent progress with PT. Objectively, pt with modest improvement in strength and 5xSTS, passive ROM comparable to prior ROM measurements but actually demonstrates mildly reduced active ROM today for knee flexion. Given plateau in progress and continued reports of high pain levels and swelling, recommend discharge to  independent HEP and further follow up with provider. Extensive time is spent with education on activity modification according to symptoms, as pt notes that she has tendency to consistently push into painful positions with exercises and activities and this may be exacerbating symptoms. No adverse events, pt continues with pain levels consistent w/ previous sessions. Pt verbalizes agreement with discharge plan and states she will follow up with provider. Pt departs today's session in no acute distress, all voiced questions/concerns addressed appropriately from PT perspective.     OBJECTIVE IMPAIRMENTS: Abnormal gait, decreased activity tolerance, decreased endurance, decreased mobility, difficulty walking, decreased ROM, decreased strength, increased edema, improper body mechanics, postural dysfunction, and pain.   ACTIVITY LIMITATIONS: carrying, lifting, bending, sitting, standing, squatting, sleeping, stairs, transfers, and locomotion level  PARTICIPATION LIMITATIONS: meal prep, cleaning, laundry, driving, shopping, community activity, and occupation  PERSONAL FACTORS: Time since onset of injury/illness/exacerbation are also affecting patient's functional outcome.   REHAB POTENTIAL: Good  CLINICAL DECISION MAKING: Stable/uncomplicated  EVALUATION COMPLEXITY: Low   GOALS: Goals reviewed with patient? No  SHORT TERM GOALS: Target date: 06/02/2023 Pt will demonstrate appropriate understanding and performance of initially prescribed HEP in order to facilitate improved independence with management of symptoms.  Baseline: HEP provided on eval 06/02/23: demonstrates understanding Goal status: MET  2. Pt will score greater than or equal to 24 on FOTO in order to demonstrate improved perception of function due to symptoms.  Baseline: 1  Goal status: MET  LONG TERM GOALS: Target date: 08/19/2023  (Updated 07/08/23) Pt will score 47 or greater on FOTO in order to demonstrate improved perception of  function due to symptoms.  Baseline: 1 07/02/23: 38% 08/19/23: 41%  Goal status: NOT MET  2.  Pt will demonstrate at least 0-110 degrees of knee AROM in order to facilitate improved tolerance to functional movements such as walking/stairs.  Baseline: see ROM chart above Goal status: MET  3.  Pt will demonstrate grossly symmetrical knee flex/extension MMT  in order to facilitate improved functional strength.  Baseline: see MMT chart above 07/02/23: 4/5 left knee 07/08/23: 4+/5 L knee  08/19/23: symmetrical extension, mild L knee flexion weakness  Goal status: PARTIALLY MET  4.  Pt will be able to perform TUG in less than or equal to 14 sec with LRAD in order to indicate reduced risk of falling (cutoff score for fall risk 13.5 sec in community dwelling older adults per [facility] et al, 2000)  Baseline: 42 sec RW  06/09/23: 16.6 without AD  07/02/23: 15.8 sec without AD   07/08/23: 11 sec no AD   Goal status: MET  5. Pt will demonstrate appropriate performance of final prescribed HEP in order to facilitate improved self-management of symptoms post-discharge.   Baseline: initial HEP prescribed  07/08/23: reports good adherence with HEP  08/19/23: reports good adherence with HEP  Goal status: MET  6. Pt will be able to perform 5xSTS in <14 sec without UE support in order to reflect reduced fall risk and improved functional mobility  Baseline: 21 sec 08/19/23: 19 sec  Goal status: NOT MET   PLAN: DISCHARGE 08/19/23  PT FREQUENCY: NA  PT DURATION: NA  PLANNED INTERVENTIONS: NA  PLAN FOR NEXT SESSION: discharge to independent HEP, follow up with provider  Ashley Murrain PT, DPT 08/19/2023 5:25 PM

## 2023-08-24 ENCOUNTER — Ambulatory Visit (INDEPENDENT_AMBULATORY_CARE_PROVIDER_SITE_OTHER): Payer: PRIVATE HEALTH INSURANCE | Admitting: Orthopaedic Surgery

## 2023-08-24 ENCOUNTER — Encounter: Payer: Self-pay | Admitting: Orthopaedic Surgery

## 2023-08-24 DIAGNOSIS — Z96652 Presence of left artificial knee joint: Secondary | ICD-10-CM | POA: Diagnosis not present

## 2023-08-24 NOTE — Progress Notes (Signed)
Office Visit Note   Patient: Cheryl Bush           Date of Birth: May 14, 1966           MRN: [id number] Visit Date: 08/24/2023              Requested by: Norm Salt, PA [address] [place],  [place] [address] PCP: Norm Salt, PA   Assessment & Plan: Visit Diagnoses:  1. Status post total left knee replacement     Plan: Patient is now 4 months status post left total knee replacement.  She has trouble with prolonged standing or walking especially at work and with ADLs.  Could have a subcutaneous neuroma from the surgery.  Will just treat this symptomatically for now.  Medical forearm filled out today for 3 more months of work restrictions.  Recheck in 3 months with repeat x-rays of the left knee.  Total face to face encounter time was greater than 25 minutes and over half of this time was spent in counseling and/or coordination of care.  Follow-Up Instructions: Return in about 3 months (around 11/24/2023).   Orders:  No orders of the defined types were placed in this encounter.  No orders of the defined types were placed in this encounter.     Procedures: No procedures performed   Clinical Data: No additional findings.   Subjective: Chief Complaint  Patient presents with   Left Knee - Follow-up    S/p left total knee arthroplasty 04/23/2023    HPI Patient is 4 months postop.  She has been on work restrictions at [employer].  She has increased symptoms with prolonged standing and walking.  She feels tenderness to the soft tissues.  Review of Systems  Constitutional: Negative.   HENT: Negative.    Eyes: Negative.   Respiratory: Negative.    Cardiovascular: Negative.   Endocrine: Negative.   Musculoskeletal: Negative.   Neurological: Negative.   Hematological: Negative.   Psychiatric/Behavioral: Negative.    All other systems reviewed and are negative.    Objective: Vital Signs: LMP 03/08/2013   Physical Exam Vitals and nursing note  reviewed.  Constitutional:      Appearance: She is well-developed.  HENT:     Head: Atraumatic.     Nose: Nose normal.  Eyes:     Extraocular Movements: Extraocular movements intact.  Cardiovascular:     Pulses: Normal pulses.  Pulmonary:     Effort: Pulmonary effort is normal.  Abdominal:     Palpations: Abdomen is soft.  Musculoskeletal:     Cervical back: Neck supple.  Skin:    General: Skin is warm.     Capillary Refill: Capillary refill takes less than 2 seconds.  Neurological:     Mental Status: She is alert. Mental status is at baseline.  Psychiatric:        Behavior: Behavior normal.        Thought Content: Thought content normal.        Judgment: Judgment normal.     Ortho Exam Exam of the left knee shows fully healed surgical scar.  Slight diffuse soft tissue tenderness.  No joint effusion.  Collaterals are stable.  Range of motion is functional. Specialty Comments:  No specialty comments available.  Imaging: No results found.   PMFS History: Patient Active Problem List   Diagnosis Date Noted   Status post total left knee replacement 04/23/2023   Primary osteoarthritis of right knee 07/28/2022   Primary osteoarthritis  of left knee 07/28/2022   Leukopenia 09/07/2016   Mastitis-left, medial aspect 03/15/2013   Past Medical History:  Diagnosis Date   High cholesterol    Inverted nipple left   abcess removed from areola, causing some scarring and inverted nipple    Family History  Problem Relation Age of Onset   Heart disease Brother     Past Surgical History:  Procedure Laterality Date   KNEE SURGERY     left- arthroscopy   TOTAL KNEE ARTHROPLASTY Left 04/23/2023   Procedure: LEFT TOTAL KNEE ARTHROPLASTY;  Surgeon: Tarry Kos, MD;  Location: [facility] OR;  Service: Orthopedics;  Laterality: Left;   Social History   Occupational History   Not on file  Tobacco Use   Smoking status: Never   Smokeless tobacco: Never  Vaping Use   Vaping status:  Never Used  Substance and Sexual Activity   Alcohol use: No   Drug use: No   Sexual activity: Never

## 2023-08-30 ENCOUNTER — Telehealth: Payer: Self-pay | Admitting: Orthopaedic Surgery

## 2023-08-30 NOTE — Telephone Encounter (Signed)
[employer] forms received. To [employer]. 

## 2023-08-31 ENCOUNTER — Encounter: Payer: PRIVATE HEALTH INSURANCE | Admitting: Physical Therapy

## 2023-11-16 ENCOUNTER — Ambulatory Visit: Payer: PRIVATE HEALTH INSURANCE | Admitting: Orthopaedic Surgery

## 2023-11-16 ENCOUNTER — Other Ambulatory Visit (INDEPENDENT_AMBULATORY_CARE_PROVIDER_SITE_OTHER): Payer: PRIVATE HEALTH INSURANCE

## 2023-11-16 DIAGNOSIS — Z96652 Presence of left artificial knee joint: Secondary | ICD-10-CM | POA: Diagnosis not present

## 2023-11-16 MED ORDER — IBUPROFEN 800 MG PO TABS: 800.0000 mg | ORAL_TABLET | Freq: Three times a day (TID) | ORAL | 2 refills | Status: DC | PRN

## 2023-11-16 NOTE — Progress Notes (Signed)
   Post-Op Visit Note   Patient: Cheryl Bush           Date of Birth: [birth year]           MRN: [id number] Visit Date: 11/16/2023 PCP: Norm Salt, PA   Assessment & Plan:  Chief Complaint:  Chief Complaint  Patient presents with   Left Knee - Follow-up   Visit Diagnoses:  1. History of left knee replacement     Plan: Patient is 6 months postop from a left total knee arthroplasty.  Reports that she has 5-7 out of 10 pain depending on how active she is.  She reports swelling around the knee.  Examination of the left knee shows a fully healed surgical scar.  Extension to 0 and flexion to about 110 degrees with soreness and discomfort due to the swelling.  There is no joint effusion.  Collaterals are stable.    Implant looks good on x-rays.  I would give this more time for recovery and muscular strengthening.  Recheck in 6 months with repeat x-rays.  I would like to send in some Motrin to see if this will help with inflammation.   Follow-Up Instructions: Return in about 6 months (around 05/15/2024).   Orders:  Orders Placed This Encounter  Procedures   XR Knee 1-2 Views Left   Meds ordered this encounter  Medications   ibuprofen (ADVIL) 800 MG tablet    Sig: Take 1 tablet (800 mg total) by mouth every 8 (eight) hours as needed.    Dispense:  30 tablet    Refill:  2    Imaging: XR Knee 1-2 Views Left Result Date: 11/16/2023 X-rays of the left knee show stable left total knee arthroplasty with press-fit components without any evidence of loosening or subsidence.   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   Mastitis-left, medial aspect 03/15/2013   Past Medical History:  Diagnosis Date   High cholesterol    Inverted nipple left   abcess removed from areola,  causing some scarring and inverted nipple    Family History  Problem Relation Age of Onset   Heart disease Brother     Past Surgical History:  Procedure Laterality Date   KNEE SURGERY     left- arthroscopy   TOTAL KNEE ARTHROPLASTY Left 04/23/2023   Procedure: LEFT TOTAL KNEE ARTHROPLASTY;  Surgeon: Tarry Kos, MD;  Location: [facility] OR;  Service: Orthopedics;  Laterality: Left;   Social History   Occupational History   Not on file  Tobacco Use   Smoking status: Never   Smokeless tobacco: Never  Vaping Use   Vaping status: Never Used  Substance and Sexual Activity   Alcohol use: No   Drug use: No   Sexual activity: Never

## 2023-11-18 ENCOUNTER — Telehealth: Payer: Self-pay | Admitting: Orthopaedic Surgery

## 2023-11-18 NOTE — Telephone Encounter (Signed)
What are her restrictions currently

## 2023-11-18 NOTE — Telephone Encounter (Signed)
Patients long term restrictions are ending on 11/25/23. Please advise if to continue the same restrictions or return to regular work. Forms received. Thank you!

## 2023-11-23 NOTE — Telephone Encounter (Signed)
Current form reflecting restrictions given to Dr. Roda Shutters last week. Waiting for response.

## 2023-11-23 NOTE — Telephone Encounter (Signed)
Response from Dr. Roda Shutters: can extend same restrictions x 1 yr after surgery. [employer] advised.

## 2023-12-03 ENCOUNTER — Other Ambulatory Visit: Payer: Self-pay | Admitting: Cardiology

## 2023-12-03 LAB — POCT ABI - SCREENING FOR PILOT NO CHARGE
Left ABI: 1.21
Right ABI: 1.18

## 2023-12-03 LAB — HEMOGLOBIN A1C: Hemoglobin A1C: 5.3

## 2023-12-04 LAB — LIPID PANEL W/O CHOL/HDL RATIO
Cholesterol, Total: 246 mg/dL — ABNORMAL HIGH (ref 100–199)
HDL: 45 mg/dL (ref >39)
LDL Chol Calc (NIH): 162 mg/dL — ABNORMAL HIGH (ref 0–99)
Triglycerides: 213 mg/dL — ABNORMAL HIGH (ref 0–149)
VLDL Cholesterol Cal: 39 mg/dL (ref 5–40)

## 2023-12-07 ENCOUNTER — Telehealth: Payer: Self-pay

## 2023-12-07 NOTE — Telephone Encounter (Signed)
Patient informed lipid results, cholesterol-246 mg/dL, Triglycerides 956 mg/dL, LDL 387 mg/dL, all 3 are elevated. Discussed low fat/low cholesterol/low sugar diet, increasing as tolerated (Left knee replacement on 04/23/2023). Patient stated that she is watching her diet, trying to exercise as tolerated, not understanding why it is elevated. Patient states at the [facility] symposium on 12/03/2023, also had rapid Hgb A1C and cholesterol, was total cholesterol was 150, triglycerides were 74, not sure which is accurate. Patient informed will forward results to her primary care physician.

## 2024-01-24 ENCOUNTER — Telehealth: Payer: Self-pay | Admitting: Orthopaedic Surgery

## 2024-01-24 NOTE — Telephone Encounter (Signed)
 Patient came in office regarding forms completed 11/23/23. She stated that [employer] is telling her that they cannot read the form. I advised patient that I can darken on the copier and refax. I darkend the form and gave copy to patient and faxed the darker copy to [employer] [phone]

## 2024-02-04 NOTE — Progress Notes (Signed)
 The patient attended a screening event on 12/03/23 where her BP screening results were 140/83 and the A1c was 5.3%. At the event the patient noted she doesn't smoke, she has a pcp and no sdoh insecurities.  Per chart review the pt does have a pcp and insurance. The last office visit was a left knee follow up referred by her pcp. Chart review also indicates a future appt with cardiology on 03/03/24 and Orthopedic on 05/16/24. Chw called pt three different times. The first call attempt a VM was left. The last two call attempts a female picked up the phone. The Female said that the pt was sleep the first time, stated he was her son and asked that the chw give the pt's information to him. The second time the female stated that the pt had gone to the store and left their phone home. Due to three unsuccessful call attempts an abnormal results letter will be mailed to the address on file. An additional follow up will be done in according to the health equity team's protocol.

## 2024-02-29 ENCOUNTER — Encounter: Payer: Self-pay | Admitting: Orthopaedic Surgery

## 2024-02-29 ENCOUNTER — Ambulatory Visit: Payer: PRIVATE HEALTH INSURANCE | Admitting: Orthopaedic Surgery

## 2024-02-29 DIAGNOSIS — Z96652 Presence of left artificial knee joint: Secondary | ICD-10-CM

## 2024-02-29 NOTE — Progress Notes (Signed)
 Office Visit Note   Patient: Cheryl Bush           Date of Birth: November 12, 1965           MRN: [id number] Visit Date: 02/29/2024              Requested by: Dianah Fort, PA [address] [place],  [place] [address] PCP: Dianah Fort, PA   Assessment & Plan: Visit Diagnoses:  1. Status post total left knee replacement     Plan: History of Present Illness Cheryl Bush is a 58 year old female who presents with persistent knee pain and swelling following left knee replacement surgery.  She underwent knee replacement surgery in June 2024 and experiences persistent throbbing and aching pain in the knee, rated 7 to 8 out of 10. The pain occurs at rest and with activity, such as cleaning or household tasks, and is sometimes worse at night. Light contact can also cause pain.  The pain and swelling significantly impact her ability to perform physical activities required by her job, such as bending and going upstairs. She has been unable to work since the surgery due to these limitations.  She manages the pain with over-the-counter medications like Advil . There are no signs of infection.  Physical Exam MUSCULOSKELETAL: Knee flexibility is good, with the ability to straighten and flex to approximately 100 degrees. No signs of infection in the knee.  Results RADIOLOGY Knee X-ray: No signs of infection, loosening, or shifting of knee replacement (10/2023)  Assessment and Plan Postoperative pain following left knee replacement Persistent pain and swelling post-knee replacement attributed to integration with bone and soft tissue issues. No infection or prosthesis issues. Explained healing process duration and commonality of symptoms. Advised on social security disability application due to ending long-term disability benefits. - Recommend knee brace during activities. - Advise ibuprofen  up to 800 mg for pain. - Follow-up in six months with repeat radiographs - long term  disability expires 04/22/24  Total face to face encounter time was greater than 25 minutes and over half of this time was spent in counseling and/or coordination of care.  Follow-Up Instructions: Return in about 6 months (around 08/31/2024) for with lindsey.   Orders:  No orders of the defined types were placed in this encounter.  No orders of the defined types were placed in this encounter.  Subjective: Chief Complaint  Patient presents with   Left Knee - Follow-up    Left total knee arthroplasty 04/23/2023    Review of Systems  Constitutional: Negative.   HENT: Negative.    Eyes: Negative.   Respiratory: Negative.    Cardiovascular: Negative.   Endocrine: Negative.   Musculoskeletal: Negative.   Neurological: Negative.   Hematological: Negative.   Psychiatric/Behavioral: Negative.    All other systems reviewed and are negative.    Objective: Vital Signs: LMP 03/08/2013   Physical Exam Vitals and nursing note reviewed.  Constitutional:      Appearance: She is well-developed.  HENT:     Head: Atraumatic.     Nose: Nose normal.  Eyes:     Extraocular Movements: Extraocular movements intact.  Cardiovascular:     Pulses: Normal pulses.  Pulmonary:     Effort: Pulmonary effort is normal.  Abdominal:     Palpations: Abdomen is soft.  Musculoskeletal:     Cervical back: Neck supple.  Skin:    General: Skin is warm.     Capillary Refill: Capillary refill takes less than  2 seconds.  Neurological:     Mental Status: She is alert. Mental status is at baseline.  Psychiatric:        Behavior: Behavior normal.        Thought Content: Thought content normal.        Judgment: Judgment normal.     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   Mastitis-left, medial aspect  03/15/2013   Past Medical History:  Diagnosis Date   High cholesterol    Inverted nipple left   abcess removed from areola, causing some scarring and inverted nipple    Family History  Problem Relation Age of Onset   Heart disease Brother     Past Surgical History:  Procedure Laterality Date   KNEE SURGERY     left- arthroscopy   TOTAL KNEE ARTHROPLASTY Left 04/23/2023   Procedure: LEFT TOTAL KNEE ARTHROPLASTY;  Surgeon: Wes Hamman, MD;  Location: [facility] OR;  Service: Orthopedics;  Laterality: Left;   Social History   Occupational History   Not on file  Tobacco Use   Smoking status: Never   Smokeless tobacco: Never  Vaping Use   Vaping status: Never Used  Substance and Sexual Activity   Alcohol use: No   Drug use: No   Sexual activity: Never

## 2024-03-03 ENCOUNTER — Ambulatory Visit: Payer: PRIVATE HEALTH INSURANCE | Admitting: Cardiology

## 2024-03-03 ENCOUNTER — Encounter: Payer: Self-pay | Admitting: Cardiology

## 2024-03-03 VITALS — BP 125/81 | HR 61 | Ht 61.0 in | Wt 154.1 lb

## 2024-03-03 DIAGNOSIS — R0683 Snoring: Secondary | ICD-10-CM

## 2024-03-03 DIAGNOSIS — Z79899 Other long term (current) drug therapy: Secondary | ICD-10-CM

## 2024-03-03 DIAGNOSIS — Z7689 Persons encountering health services in other specified circumstances: Secondary | ICD-10-CM

## 2024-03-03 MED ORDER — ATORVASTATIN CALCIUM 10 MG PO TABS
10.0000 mg | ORAL_TABLET | Freq: Every day | ORAL | 3 refills | Status: AC
Start: 2024-03-03 — End: 2024-06-01

## 2024-03-03 NOTE — Patient Instructions (Signed)
 Medication Instructions:  Your physician has recommended you make the following change in your medication:  START: Lipitor 10 mg once daily *If you need a refill on your cardiac medications before your next appointment, please call your pharmacy*  Lab Work: IN 16 weeks: Lipids - fasting [address]  [address] If you have labs (blood work) drawn today and your tests are completely normal, you will receive your results only by: MyChart Message (if you have MyChart) OR A paper copy in the mail If you have any lab test that is abnormal or we need to change your treatment, we will call you to review the results.  Testing/Procedures: WatchPAT? is an FDA-cleared portable home sleep study test that uses a watch and 3 points of contact to monitor 7 different channels, including your heart rate, oxygen saturation, body position, snoring, and chest motion.  The study is easy to use from the comfort of your own home and accurately detect sleep apnea.  Before bed, you attach the chest sensor, attached the sleep apnea bracelet to your nondominant hand, and attach the finger probe.  After the study, the raw data is downloaded from the watch and scored for apnea events.   For more information: [email or web address]  Patient Testing Instructions:  Once our office has received insurance approval for you to complete this test, we will contact you with a PIN to activate the device.  This typically takes 2-3 weeks.  Please do not open the box until approved.   Do not put battery into the device until bedtime when you are ready to begin the test. Please call the support number if you need assistance after following the instructions below: 24 hour support line- [phone] or [facility] support at [phone] (option 2)  Download the [employer] [facility]" app through the [employer] or [employer]. Be sure to turn on or enable access to Bluetooth in settings on your smartphone. Make  sure no other Bluetooth devices are on and within the vicinity of your smartphone and WatchPAT watch during testing.  Make sure to leave your smart phone plugged in and charging all night.  When ready for bed:  Follow the instructions step by step in the WatchPAT One app to activate the testing device. For additional instructions, including video instruction, visit the WatchPAT One video on Youtube. You can search for WatchPAT One within Youtube (video is 4 minutes and 18 seconds) or enter: https://youtube/watch?[email or web address]_vbiwxE Please note: You will be prompted to enter a PIN to connect via Bluetooth when starting the test. The PIN will be assigned to you after insurance has approved the test.  The device is disposable, but it recommended that you retain the device until you receive a call letting you know the study has been received and the results have been interpreted.  We will let you know if the study did not transmit to us  properly after the test is completed. You do not need to call us  to confirm the receipt of the test.  Please complete the test within 48 hours of receiving PIN.   Frequently Asked Questions:  What is Watch PAT One?  A single use, fully disposable home sleep apnea testing device and will not need to be returned after completion.  What are the requirements to use WatchPAT One?  A successful WatchPAT One sleep study requires a WatchPAT One device, your smart phone, WatchPAT One app, your PIN number, and internet access. What type of phone do I need?  You should have a smart phone that uses Android 5.1 and above or any iPhone with IOS 10 and above. How can I download the WatchPAT one app?  Based on your device type search for WatchPAT One app either in [employer] for [employer] or [employer] for [employer]. Where will I get my PIN for the study?  Your PIN will be provided by your physician's office after insurance has approved the test. This process typically takes  2-3 weeks. It is used for authentication and if you lose/forget your PIN, please reach out to your provider's office.  I do not have internet at home. Can I still complete a WatchPAT One study?  WatchPAT One needs internet connection throughout the night to be able to transmit the sleep data. You can use your home/local internet or your cellular data package. However, it is always recommended to use home/local internet. It is estimated that between 20MB-30MB of data will be used with each study, but the application will be looking for space in the phone to start the study.  What happens if I lose internet or Bluetooth connection?  During the internet disconnection, your phone will not be able to transmit the sleep data.  All the data, will be stored in your phone.  As soon as the internet connection is back on, the phone will resume sending the sleep data. During the Bluetooth disconnection, WatchPAT One will not be able to to send the sleep data to your phone.  Data will be kept in the WatchPAT One until both devices have Bluetooth connection back on.  As soon as the connection is back on, WatchPAT one will send the sleep data to the phone.  How long do I need to wear the WatchPAT one?  After you start the study, you should wear the device at least 6 hours.  How far should I keep my phone from the device?  During the night, your phone should remain within 15 feet of where you sleep.  What happens if I leave the room for restroom or other reasons?  Leaving the room for any reason will not cause any problem. As soon as your get back to the room, both devices will reconnect and will continue to send the sleep data. Can I use my phone during the sleep study?  Yes, you can use your phone as usual during the study. But it is recommended to put your WatchPAT One on when you are ready to go to bed.  How will I get my study results?  A soon as you completed your study, your sleep data will be sent to the  provider. They will then share the results with you when they are ready.    Follow-Up: At [facility], you and your health needs are our priority.  As part of our continuing mission to provide you with exceptional heart care, our providers are all part of one team.  This team includes your primary Cardiologist (physician) and Advanced Practice Providers or APPs (Physician Assistants and Nurse Practitioners) who all work together to provide you with the care you need, when you need it.  Your next appointment:   1 year(s)  Provider:   Kardie Tobb, DO

## 2024-03-10 NOTE — Progress Notes (Signed)
 Cardiology Office Note:    Date:  03/10/2024   ID:  Cheryl Bush, DOB [birth year], MRN [id number]  PCP:  Dianah Fort, PA  Cardiologist:  Abdou Stocks, DO  Electrophysiologist:  None   Referring MD: Dianah Fort, PA   " I am here due to my elevated cholesterol"   History of Present Illness:    Cheryl Bush is a 58 y.o. female with a hx of hyperlipidemia.   She presents for management of high cholesterol and triglycerides. She is accompanied by her son, Melecio Sports.   Recent blood work shows total cholesterol at 246 mg/dL, LDL at 562 mg/dL, and triglycerides above 150 mg/dL. She has not received prior treatment for these conditions and has difficulty accessing her regular doctor for follow-up and prescription management. She has been taking omega-3 supplements without sufficient improvement in her cholesterol levels. She prefers Lipitor over Crestor due to concerns about potential side effects.  There is a family history of hyperlipidemia, indicating a possible genetic predisposition. She is aware of the potential risk to her son and the need for him to monitor his cholesterol levels.  Past Medical History:  Diagnosis Date   High cholesterol    Inverted nipple left   abcess removed from areola, causing some scarring and inverted nipple    Past Surgical History:  Procedure Laterality Date   KNEE SURGERY     left- arthroscopy   TOTAL KNEE ARTHROPLASTY Left 04/23/2023   Procedure: LEFT TOTAL KNEE ARTHROPLASTY;  Surgeon: Wes Hamman, MD;  Location: [facility] OR;  Service: Orthopedics;  Laterality: Left;    Current Medications: Current Meds  Medication Sig   atorvastatin (LIPITOR) 10 MG tablet Take 1 tablet (10 mg total) by mouth daily.     Allergies:   Patient has no known allergies.   Social History   Socioeconomic History   Marital status: Married    Spouse name: Not on file   Number of children: 1   Years of education: Not on file   Highest education level: Not on  file  Occupational History   Not on file  Tobacco Use   Smoking status: Never   Smokeless tobacco: Never  Vaping Use   Vaping status: Never Used  Substance and Sexual Activity   Alcohol use: No   Drug use: No   Sexual activity: Never  Other Topics Concern   Not on file  Social History Narrative   Not on file   Social Drivers of Health   Financial Resource Strain: Not on file  Food Insecurity: Patient Declined (12/03/2023)   Hunger Vital Sign    Worried About Running Out of Food in the Last Year: Patient declined    Ran Out of Food in the Last Year: Patient declined  Transportation Needs: No Transportation Needs (12/03/2023)   PRAPARE - Administrator, Civil Service (Medical): No    Lack of Transportation (Non-Medical): No  Physical Activity: Not on file  Stress: Not on file  Social Connections: Not on file     Family History: The patient's family history includes Heart disease in her brother.  ROS:   Review of Systems  Constitution: Negative for decreased appetite, fever and weight gain.  HENT: Negative for congestion, ear discharge, hoarse voice and sore throat.   Eyes: Negative for discharge, redness, vision loss in right eye and visual halos.  Cardiovascular: Negative for chest pain, dyspnea on exertion, leg swelling, orthopnea and palpitations.  Respiratory: Negative for cough, hemoptysis,  shortness of breath and snoring.   Endocrine: Negative for heat intolerance and polyphagia.  Hematologic/Lymphatic: Negative for bleeding problem. Does not bruise/bleed easily.  Skin: Negative for flushing, nail changes, rash and suspicious lesions.  Musculoskeletal: Negative for arthritis, joint pain, muscle cramps, myalgias, neck pain and stiffness.  Gastrointestinal: Negative for abdominal pain, bowel incontinence, diarrhea and excessive appetite.  Genitourinary: Negative for decreased libido, genital sores and incomplete emptying.  Neurological: Negative for brief  paralysis, focal weakness, headaches and loss of balance.  Psychiatric/Behavioral: Negative for altered mental status, depression and suicidal ideas.  Allergic/Immunologic: Negative for HIV exposure and persistent infections.    EKGs/Labs/Other Studies Reviewed:    The following studies were reviewed today:   EKG:  The ekg ordered today demonstrates normal sinus rhythm  Recent Labs: 04/13/2023: ALT 14; BUN 16; Creatinine 0.54; Potassium 4.0; Sodium 138 04/24/2023: Hemoglobin 13.0; Platelets 147  Recent Lipid Panel    Component Value Date/Time   CHOL 246 (H) 12/03/2023 0901   TRIG 213 (H) 12/03/2023 0901   HDL 45 12/03/2023 0901   CHOLHDL 5.6 Ratio 10/17/2009 1943   VLDL 30 10/17/2009 1943   LDLCALC 162 (H) 12/03/2023 0901   LDLDIRECT 135 (H) 02/11/2010 2116    Physical Exam:    VS:  BP 125/81 (BP Location: Left Arm, Patient Position: Sitting, Cuff Size: Normal)   Pulse 61   Ht 5\' 1"  (1.549 m)   Wt 154 lb 1.6 oz (69.9 kg)   LMP 03/08/2013   SpO2 96%   BMI 29.12 kg/m     Wt Readings from Last 3 Encounters:  03/03/24 154 lb 1.6 oz (69.9 kg)  04/23/23 156 lb (70.8 kg)  04/13/23 156 lb 6.4 oz (70.9 kg)     GEN: Well nourished, well developed in no acute distress HEENT: Normal NECK: No JVD; No carotid bruits LYMPHATICS: No lymphadenopathy CARDIAC: S1S2 noted,RRR, no murmurs, rubs, gallops RESPIRATORY:  Clear to auscultation without rales, wheezing or rhonchi  ABDOMEN: Soft, non-tender, non-distended, +bowel sounds, no guarding. EXTREMITIES: No edema, No cyanosis, no clubbing MUSCULOSKELETAL:  No deformity  SKIN: Warm and dry NEUROLOGIC:  Alert and oriented x 3, non-focal PSYCHIATRIC:  Normal affect, good insight  ASSESSMENT:    1. Encounter to establish care   2. Snoring   3. 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  - Prescribed Lipitor 10 mg  daily, 90-day supply, three refills. - Repeat blood work in 16 weeks to assess efficacy. - Adjust medication based on results. - Educated on familial lipid monitoring.  Suspected Sleep Apnea Suspected sleep apnea due to sleep disturbances and snoring. - Arrange sleep apnea testing   The patient is in agreement with the above plan. The patient left the office in stable condition.  The patient will follow up in   Medication Adjustments/Labs and Tests Ordered: Current medicines are reviewed at length with the patient today.  Concerns regarding medicines are outlined above.  Orders Placed This Encounter  Procedures   Lipid panel   EKG 12-Lead   [facility] Sleep Study   Meds ordered this encounter  Medications   atorvastatin (LIPITOR) 10 MG tablet    Sig: Take 1 tablet (10 mg total) by mouth daily.    Dispense:  90 tablet    Refill:  3    Patient Instructions  Medication Instructions:  Your physician has recommended you make the following change in your medication:  START: Lipitor 10 mg once daily *If you need a refill on your cardiac medications before your next appointment, please call your pharmacy*  Lab Work: IN 16 weeks: Lipids - fasting [address]  [address] If you have labs (blood work) drawn today and your tests are completely normal, you will receive your results only by: MyChart Message (if you have MyChart) OR A paper copy in the mail If you have any lab test that is abnormal or we need to change your treatment, we will call you to review the results.  Testing/Procedures: WatchPAT? is an FDA-cleared portable home sleep study test that uses a watch and 3 points of contact to monitor 7 different channels, including your heart rate, oxygen saturation, body position, snoring, and chest motion.  The study is easy to use from the comfort of your own home and accurately detect sleep apnea.  Before bed, you attach the chest sensor, attached the sleep apnea bracelet to your  nondominant hand, and attach the finger probe.  After the study, the raw data is downloaded from the watch and scored for apnea events.   For more information: [email or web address]  Patient Testing Instructions:  Once our office has received insurance approval for you to complete this test, we will contact you with a PIN to activate the device.  This typically takes 2-3 weeks.  Please do not open the box until approved.   Do not put battery into the device until bedtime when you are ready to begin the test. Please call the support number if you need assistance after following the instructions below: 24 hour support line- [phone] or [facility] support at [phone] (option 2)  Download the [facility] [facility] One" app through the [employer] or [employer]. Be sure to turn on or enable access to Bluetooth in settings on your smartphone. Make sure no other Bluetooth devices are on and within the vicinity of your smartphone and WatchPAT watch during testing.  Make sure to leave your smart phone plugged in and charging all night.  When ready for bed:  Follow the instructions step by step in the WatchPAT One app to activate the testing device. For additional instructions, including video instruction, visit the WatchPAT One video on Youtube. You can search for WatchPAT One within Youtube (video is 4 minutes and 18 seconds) or enter: https://youtube/watch?[email or web address]_vbiwxE Please note: You will be prompted to enter a PIN to connect via Bluetooth when starting the test. The PIN will be assigned to you after insurance has approved the test.  The device is disposable, but it recommended that you retain the device until you receive a call letting you know the study has been received and the results have been interpreted.  We will let you know if the study did not transmit to us  properly after the test is completed. You do not need to call us  to confirm the receipt of the test.  Please  complete the test within 48 hours of receiving PIN.   Frequently Asked Questions:  What is Watch PAT One?  A single use, fully disposable home sleep apnea testing device and will not need to be returned after completion.  What are the requirements to use WatchPAT One?  A successful WatchPAT One sleep study requires a WatchPAT One device, your smart phone, WatchPAT One app, your PIN number, and internet access. What type of phone do I need?  You should have a smart phone that uses Android 5.1 and above or any  iPhone with IOS 10 and above. How can I download the WatchPAT one app?  Based on your device type search for WatchPAT One app either in [employer] for [employer] or [employer] for [employer]. Where will I get my PIN for the study?  Your PIN will be provided by your physician's office after insurance has approved the test. This process typically takes 2-3 weeks. It is used for authentication and if you lose/forget your PIN, please reach out to your provider's office.  I do not have internet at home. Can I still complete a WatchPAT One study?  WatchPAT One needs internet connection throughout the night to be able to transmit the sleep data. You can use your home/local internet or your cellular data package. However, it is always recommended to use home/local internet. It is estimated that between 20MB-30MB of data will be used with each study, but the application will be looking for space in the phone to start the study.  What happens if I lose internet or Bluetooth connection?  During the internet disconnection, your phone will not be able to transmit the sleep data.  All the data, will be stored in your phone.  As soon as the internet connection is back on, the phone will resume sending the sleep data. During the Bluetooth disconnection, WatchPAT One will not be able to to send the sleep data to your phone.  Data will be kept in the WatchPAT One until both devices have  Bluetooth connection back on.  As soon as the connection is back on, WatchPAT one will send the sleep data to the phone.  How long do I need to wear the WatchPAT one?  After you start the study, you should wear the device at least 6 hours.  How far should I keep my phone from the device?  During the night, your phone should remain within 15 feet of where you sleep.  What happens if I leave the room for restroom or other reasons?  Leaving the room for any reason will not cause any problem. As soon as your get back to the room, both devices will reconnect and will continue to send the sleep data. Can I use my phone during the sleep study?  Yes, you can use your phone as usual during the study. But it is recommended to put your WatchPAT One on when you are ready to go to bed.  How will I get my study results?  A soon as you completed your study, your sleep data will be sent to the provider. They will then share the results with you when they are ready.    Follow-Up: At [facility], you and your health needs are our priority.  As part of our continuing mission to provide you with exceptional heart care, our providers are all part of one team.  This team includes your primary Cardiologist (physician) and Advanced Practice Providers or APPs (Physician Assistants and Nurse Practitioners) who all work together to provide you with the care you need, when you need it.  Your next appointment:   1 year(s)  Provider:   Rania Prothero, DO      Adopting a Healthy Lifestyle.  Know what a healthy weight is for you (roughly BMI <25) and aim to maintain this   Aim for 7+ servings of fruits and vegetables daily   65-80+ fluid ounces of water  or unsweet tea for healthy kidneys   Limit to max 1 drink  of alcohol per day; avoid smoking/tobacco   Limit animal fats in diet for cholesterol and heart health - choose grass fed whenever available   Avoid highly processed foods, and foods high in  saturated/trans fats   Aim for low stress - take time to unwind and care for your mental health   Aim for 150 min of moderate intensity exercise weekly for heart health, and weights twice weekly for bone health   Aim for 7-9 hours of sleep daily   When it comes to diets, agreement about the perfect plan isnt easy to find, even among the experts. Experts at the [facility] [employer] developed an idea known as the Healthy Eating Plate. Just imagine a plate divided into logical, healthy portions.   The emphasis is on diet quality:   Load up on vegetables and fruits - one-half of your plate: Aim for color and variety, and remember that potatoes dont count.   Go for whole grains - one-quarter of your plate: Whole wheat, barley, wheat berries, quinoa, oats, brown rice, and foods made with them. If you want pasta, go with whole wheat pasta.   Protein power - one-quarter of your plate: Fish, chicken, beans, and nuts are all healthy, versatile protein sources. Limit red meat.   The diet, however, does go beyond the plate, offering a few other suggestions.   Use healthy plant oils, such as olive, canola, soy, corn, sunflower and peanut. Check the labels, and avoid partially hydrogenated oil, which have unhealthy trans fats.   If youre thirsty, drink water . Coffee and tea are good in moderation, but skip sugary drinks and limit milk and dairy products to one or two daily servings.   The type of carbohydrate in the diet is more important than the amount. Some sources of carbohydrates, such as vegetables, fruits, whole grains, and beans-are healthier than others.   Finally, stay active  Signed, Jerryl Morin, DO  03/10/2024 10:28 PM    [facility]

## 2024-05-16 ENCOUNTER — Ambulatory Visit: Payer: PRIVATE HEALTH INSURANCE | Admitting: Orthopaedic Surgery

## 2024-06-20 ENCOUNTER — Other Ambulatory Visit (INDEPENDENT_AMBULATORY_CARE_PROVIDER_SITE_OTHER): Payer: Self-pay

## 2024-06-20 ENCOUNTER — Ambulatory Visit (INDEPENDENT_AMBULATORY_CARE_PROVIDER_SITE_OTHER): Payer: PRIVATE HEALTH INSURANCE | Admitting: Orthopaedic Surgery

## 2024-06-20 ENCOUNTER — Other Ambulatory Visit (INDEPENDENT_AMBULATORY_CARE_PROVIDER_SITE_OTHER): Payer: PRIVATE HEALTH INSURANCE

## 2024-06-20 DIAGNOSIS — Z96652 Presence of left artificial knee joint: Secondary | ICD-10-CM

## 2024-06-20 DIAGNOSIS — M1711 Unilateral primary osteoarthritis, right knee: Secondary | ICD-10-CM

## 2024-06-20 NOTE — Progress Notes (Signed)
 Office Visit Note   Patient: Cheryl Bush           Date of Birth: 02-21-1966           MRN: [id number] Visit Date: 06/20/2024              Requested by: Rosalea Rosina SAILOR, PA [address] [place],  [place] [address] PCP: Rosalea Rosina SAILOR, PA   Assessment & Plan: Visit Diagnoses:  1. Status post total left knee replacement   2. Primary osteoarthritis of right knee     Plan: History of Present Illness Cheryl Bush is a 58 year old female who presents for 1 year left total knee postop visit and follow-up evaluation of of right knee pain from severe osteoarthritis.  She is back to work at a [employer] but she is having a lot of difficulty due to the physical demands of her job.  Overall the left knee is functioning well for her but she does note swelling after activity and some difficulty with using stairs and prolonged standing.  Overall she is happy with the outcome.  She is having more problems with her right knee especially with ADLs.  Exam of the left knee shows a fully healed surgical scar.  Collaterals are stable.  Functional range of motion. Exam of the right knee shows medial joint line tenderness and varus deformity.  Crepitus throughout range of motion.  Normal strength.  Assessment and Plan 1 year status post left total knee arthroplasty One year post-surgery.  Overall is functioning well but does have some sensitivity and discomfort when kneeling to pray.   - Order new x-rays of the left knee. - No abnormalities with the implant.  No evidence of loosening.  Activity as tolerated. - Dental prophylaxis reinforced.  Right knee osteoarthritis Worsening symptoms. She is interested in right knee replacement surgery. - Schedule right knee replacement surgery. - Have Debbie call her within a week or two to schedule the surgery.  Impression is severe right knee degenerative joint disease secondary to Osteoarthritis.  Patient has attempted conservative  treatment for at least 6 consecutive weeks within the past 12 weeks, including but not limited to physical therapy, home exercise program, NSAIDs, activity modification, and/or corticosteroid injections. Despite these efforts, symptoms have not improved or have worsened. Conservative measures have been deemed unsuccessful at this time. After a detailed discussion covering diagnosis and treatment options--including the risks, benefits, alternatives, and potential complications of surgical and nonsurgical management--the patient elected to proceed with surgery  Anticoagulants: No antithrombotic Postop anticoagulation: aspirin  Diabetic: No  Nickel allergy: No Prior DVT/PE: No Tobacco use: No Clearances needed for surgery: None Anticipated discharge dispo: Home   Follow-Up Instructions: No follow-ups on file.   Orders:  Orders Placed This Encounter  Procedures   XR Knee 1-2 Views Left   XR Knee 1-2 Views Right   No orders of the defined types were placed in this encounter.    Subjective: Chief Complaint  Patient presents with   Left Knee - Follow-up    HPI  Review of Systems  Constitutional: Negative.   HENT: Negative.    Eyes: Negative.   Respiratory: Negative.    Cardiovascular: Negative.   Endocrine: Negative.   Musculoskeletal: Negative.   Neurological: Negative.   Hematological: Negative.   Psychiatric/Behavioral: Negative.    All other systems reviewed and are negative.    Objective: Vital Signs: LMP 03/08/2013   Physical Exam Vitals and nursing note reviewed.  Constitutional:      Appearance: She is well-developed.  HENT:     Head: Atraumatic.     Nose: Nose normal.  Eyes:     Extraocular Movements: Extraocular movements intact.  Cardiovascular:     Pulses: Normal pulses.  Pulmonary:     Effort: Pulmonary effort is normal.  Abdominal:     Palpations: Abdomen is soft.  Musculoskeletal:     Cervical back: Neck supple.  Skin:    General: Skin is  warm.     Capillary Refill: Capillary refill takes less than 2 seconds.  Neurological:     Mental Status: She is alert. Mental status is at baseline.  Psychiatric:        Behavior: Behavior normal.        Thought Content: Thought content normal.        Judgment: Judgment normal.     Ortho Exam  Specialty Comments:  No specialty comments available.  Imaging: XR Knee 1-2 Views Left Result Date: 06/20/2024 X-rays of the left knee show a stable left total knee replacement in good alignment.   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   Mastitis-left, medial aspect 03/15/2013   Past Medical History:  Diagnosis Date   High cholesterol    Inverted nipple left   abcess removed from areola, causing some scarring and inverted nipple    Family History  Problem Relation Age of Onset   Heart disease Brother     Past Surgical History:  Procedure Laterality Date   KNEE SURGERY     left- arthroscopy   TOTAL KNEE ARTHROPLASTY Left 04/23/2023   Procedure: LEFT TOTAL KNEE ARTHROPLASTY;  Surgeon: Jerri Kay HERO, MD;  Location: [facility] OR;  Service: Orthopedics;  Laterality: Left;   Social History   Occupational History   Not on file  Tobacco Use   Smoking status: Never   Smokeless tobacco: Never  Vaping Use   Vaping status: Never Used  Substance and Sexual Activity   Alcohol use: No   Drug use: No   Sexual activity: Never

## 2024-07-07 ENCOUNTER — Telehealth: Payer: Self-pay | Admitting: Orthopaedic Surgery

## 2024-07-07 NOTE — Telephone Encounter (Signed)
 Pt submitted medical release form, Accommodation forms, An 20.00 payment

## 2024-07-10 NOTE — Telephone Encounter (Signed)
 Accommodations form for no stairs completed. IC pt, advised ready to pick up at front desk.

## 2024-07-21 DIAGNOSIS — M1711 Unilateral primary osteoarthritis, right knee: Secondary | ICD-10-CM

## 2024-08-03 NOTE — Progress Notes (Addendum)
 Pt attended 12/03/23 screening event where her bp was 140/83 and A1c was 5.3. Pt documented that she does have a PCP and insurance and noted no SDOH needs at the time of the event.  Chart review indicates that pt does have a PCP listed as Rosalea Knee PA and has insurance covered through [employer].  Pt was referred to [facility] by her PCP in May of 2025 and has had many orthopedic appts since May. Pt has an up and coming Ortho surgery scheduled for 09/11/2024. Pt was  also referred by her PCP and seen by Cardiology on 03/03/2024 where her bp was recorded as 125/81. Pt has no SDOH needs, and active PCP, improved BP as of 03/03/2024 and has active insurance.  No future follow up to be scheduled per HE protocol.

## 2024-08-04 ENCOUNTER — Encounter: Payer: PRIVATE HEALTH INSURANCE | Admitting: Physician Assistant

## 2024-08-22 ENCOUNTER — Other Ambulatory Visit: Payer: Self-pay | Admitting: Physician Assistant

## 2024-08-22 MED ORDER — DOCUSATE SODIUM 100 MG PO CAPS: 100.0000 mg | ORAL_CAPSULE | Freq: Every day | ORAL | 2 refills | Status: DC | PRN

## 2024-08-22 MED ORDER — OXYCODONE-ACETAMINOPHEN 5-325 MG PO TABS: 1.0000 | ORAL_TABLET | Freq: Four times a day (QID) | ORAL | 0 refills | Status: DC | PRN

## 2024-08-22 MED ORDER — ONDANSETRON HCL 4 MG PO TABS: 4.0000 mg | ORAL_TABLET | Freq: Three times a day (TID) | ORAL | 0 refills | Status: DC | PRN

## 2024-08-22 MED ORDER — METHOCARBAMOL 750 MG PO TABS: 750.0000 mg | ORAL_TABLET | Freq: Three times a day (TID) | ORAL | 2 refills | Status: DC | PRN

## 2024-08-29 ENCOUNTER — Telehealth: Payer: Self-pay

## 2024-08-29 NOTE — Telephone Encounter (Signed)
**Note De-Identified Jenniah Bhavsar Obfuscation** I called [employer] and s/w Lonell who advised me that a PA is not required for any sleep studies under the pts plan.  Ordering provider: Dr Sheena Associated diagnoses: Snoring-R06.83  WatchPAT PA obtained on 08/29/2024 by Tiena Manansala, Avelina HERO, LPN. Authorization: I called [employer] and s/w Tenisha who advised me that a PA is not required for any sleep studies under the pts plan. Reference #: [id number]  Patient notified of PIN ([id number]) on 08/29/2024 Gwen Sarvis Notification Method: phone.  I called the pt and she stated that she was at a office visit with one of her other providers. I advised her that a PA was not required per [employer] for the home sleep study that Dr Sheena ordered at her 03/03/24 office visit. I attempted to give her the PIN # but she stated she could not write it down at the moment so I offered to call her back and leave the PIN # on her VM and she agreed. I called her back and received a message that her VM has not been set up yet.  She does not have a MYCHART Account.  Phone note routed to covering staff for follow-up.

## 2024-08-31 ENCOUNTER — Ambulatory Visit
Admission: RE | Admit: 2024-08-31 | Discharge: 2024-08-31 | Disposition: A | Discharge status: Home / Self Care | Payer: PRIVATE HEALTH INSURANCE | Source: Ambulatory Visit | Attending: Physician Assistant | Admitting: Physician Assistant

## 2024-08-31 ENCOUNTER — Other Ambulatory Visit: Payer: Self-pay | Admitting: Physician Assistant

## 2024-08-31 ENCOUNTER — Telehealth: Payer: Self-pay | Admitting: Orthopaedic Surgery

## 2024-08-31 DIAGNOSIS — Z1231 Encounter for screening mammogram for malignant neoplasm of breast: Secondary | ICD-10-CM

## 2024-08-31 NOTE — Telephone Encounter (Signed)
 Pt came to office requesting her surgery date and time. Pt wants a call back or text at 9:00am or 12 noon tomorrow.

## 2024-09-05 ENCOUNTER — Other Ambulatory Visit: Payer: Self-pay | Admitting: Physician Assistant

## 2024-09-05 DIAGNOSIS — R928 Other abnormal and inconclusive findings on diagnostic imaging of breast: Secondary | ICD-10-CM

## 2024-09-06 NOTE — Pre-Procedure Instructions (Signed)
 Surgical Instructions   Your procedure is scheduled on Monday, November 17th. Report to [facility] Main Entrance A at 06:00 A.M., then check in with the Admitting office. Any questions or running late day of surgery: call [phone]  Questions prior to your surgery date: call [phone], Monday-Friday, 8am-4pm. If you experience any cold or flu symptoms such as cough, fever, chills, shortness of breath, etc. between now and your scheduled surgery, please notify us  at the above number.     Remember:  Do not eat after midnight the night before your surgery  You may drink clear liquids until 05:30 AM the morning of your surgery.   Clear liquids allowed are: Water , Non-Citrus Juices (without pulp), Carbonated Beverages, Clear Tea (no milk, honey, etc.), Black Coffee Only (NO MILK, CREAM OR POWDERED CREAMER of any kind), and Gatorade.  Patient Instructions  The night before surgery:  No food after midnight. ONLY clear liquids after midnight  The day of surgery (if you do NOT have diabetes):  Drink ONE (1) Pre-Surgery Clear Ensure by 05:30 AM the morning of surgery. Drink in one sitting. Do not sip.  This drink was given to you during your hospital  pre-op appointment visit.  Nothing else to drink after completing the  Pre-Surgery Clear Ensure.         If you have questions, please contact your surgeon's office.    Take these medicines the morning of surgery with A SIP OF WATER   atorvastatin  (LIPITOR)     One week prior to surgery, STOP taking any Aspirin  (unless otherwise instructed by your surgeon) Aleve, Naproxen, Ibuprofen , Motrin , Advil , Goody's, BC's, all herbal medications, fish oil, and non-prescription vitamins.                     Do NOT Smoke (Tobacco/Vaping) for 24 hours prior to your procedure.  If you use a CPAP at night, you may bring your mask/headgear for your overnight stay.   You will be asked to remove any contacts, glasses, piercing's, hearing aid's,  dentures/partials prior to surgery. Please bring cases for these items if needed.    Patients discharged the day of surgery will not be allowed to drive home, and someone needs to stay with them for 24 hours.  SURGICAL WAITING ROOM VISITATION Patients may have no more than 2 support people in the waiting area - these visitors may rotate.   Pre-op nurse will coordinate an appropriate time for 1 ADULT support person, who may not rotate, to accompany patient in pre-op.  Children under the age of 37 must have an adult with them who is not the patient and must remain in the main waiting area with an adult.  If the patient needs to stay at the hospital during part of their recovery, the visitor guidelines for inpatient rooms apply.  Please refer to the [facility] website for the visitor guidelines for any additional information.   If you received a COVID test during your pre-op visit  it is requested that you wear a mask when out in public, stay away from anyone that may not be feeling well and notify your surgeon if you develop symptoms. If you have been in contact with anyone that has tested positive in the last 10 days please notify you surgeon.      Pre-operative 4 CHG Bathing Instructions   You can play a key role in reducing the risk of infection after surgery. Your skin needs to be as free of  germs as possible. You can reduce the number of germs on your skin by washing with CHG (chlorhexidine  gluconate) soap before surgery. CHG is an antiseptic soap that kills germs and continues to kill germs even after washing.   DO NOT use if you have an allergy to chlorhexidine /CHG or antibacterial soaps. If your skin becomes reddened or irritated, stop using the CHG and notify one of our RNs at [phone].   Please shower with the CHG soap starting 4 days before surgery using the following schedule:     Please keep in mind the following:  DO NOT shave, including legs and underarms, starting the  day of your first shower.   You may shave your face at any point before/day of surgery.  Place clean sheets on your bed the day you start using CHG soap. Use a clean washcloth (not used since being washed) for each shower. DO NOT sleep with pets once you start using the CHG.   CHG Shower Instructions:  Wash your face and private area with normal soap. If you choose to wash your hair, wash first with your normal shampoo.  After you use shampoo/soap, rinse your hair and body thoroughly to remove shampoo/soap residue.  Turn the water  OFF and apply  bottle of CHG soap to a CLEAN washcloth.  Apply CHG soap ONLY FROM YOUR NECK DOWN TO YOUR TOES (washing for 3-5 minutes)  DO NOT use CHG soap on face, private areas, open wounds, or sores.  Pay special attention to the area where your surgery is being performed.  If you are having back surgery, having someone wash your back for you may be helpful. Wait 2 minutes after CHG soap is applied, then you may rinse off the CHG soap.  Pat dry with a clean towel  Put on clean clothes/pajamas   If you choose to wear lotion, please use ONLY the CHG-compatible lotions that are listed below.  Additional instructions for the day of surgery:  If you choose, you may shower the morning of surgery with an antibacterial soap.  DO NOT APPLY any lotions, deodorants, cologne, or perfumes.   Do not bring valuables to the hospital. [facility] is not responsible for any belongings/valuables. Do not wear nail polish, gel polish, artificial nails, or any other type of covering on natural nails (fingers and toes) Do not wear jewelry or makeup Put on clean/comfortable clothes.  Please brush your teeth.  Ask your nurse before applying any prescription medications to the skin.     CHG Compatible Lotions   Aveeno Moisturizing lotion  Cetaphil Moisturizing Cream  Cetaphil Moisturizing Lotion  Clairol Herbal Essence Moisturizing Lotion, Dry Skin  Clairol Herbal Essence  Moisturizing Lotion, Extra Dry Skin  Clairol Herbal Essence Moisturizing Lotion, Normal Skin  Curel Age Defying Therapeutic Moisturizing Lotion with Alpha Hydroxy  Curel Extreme Care Body Lotion  Curel Soothing Hands Moisturizing Hand Lotion  Curel Therapeutic Moisturizing Cream, Fragrance-Free  Curel Therapeutic Moisturizing Lotion, Fragrance-Free  Curel Therapeutic Moisturizing Lotion, Original Formula  Eucerin Daily Replenishing Lotion  Eucerin Dry Skin Therapy Plus Alpha Hydroxy Crme  Eucerin Dry Skin Therapy Plus Alpha Hydroxy Lotion  Eucerin Original Crme  Eucerin Original Lotion  Eucerin Plus Crme Eucerin Plus Lotion  Eucerin TriLipid Replenishing Lotion  Keri Anti-Bacterial Hand Lotion  Keri Deep Conditioning Original Lotion Dry Skin Formula Softly Scented  Keri Deep Conditioning Original Lotion, Fragrance Free Sensitive Skin Formula  Keri Lotion Fast Absorbing Fragrance Free Sensitive Skin Formula  Keri Lotion Fast  Absorbing Softly Scented Dry Skin Formula  Keri Original Lotion  Keri Skin Renewal Lotion Keri Silky Smooth Lotion  Keri Silky Smooth Sensitive Skin Lotion  Nivea Body Creamy Conditioning Oil  Nivea Body Extra Enriched Lotion  Nivea Body Original Lotion  Nivea Body Sheer Moisturizing Lotion Nivea Crme  Nivea Skin Firming Lotion  NutraDerm 30 Skin Lotion  NutraDerm Skin Lotion  NutraDerm Therapeutic Skin Cream  NutraDerm Therapeutic Skin Lotion  ProShield Protective Hand Cream  Provon moisturizing lotion  Please read over the following fact sheets that you were given.

## 2024-09-07 ENCOUNTER — Encounter (HOSPITAL_COMMUNITY)
Admission: RE | Admit: 2024-09-07 | Discharge: 2024-09-07 | Disposition: A | Discharge status: Home / Self Care | Payer: PRIVATE HEALTH INSURANCE | Source: Ambulatory Visit | Attending: Orthopaedic Surgery | Admitting: Orthopaedic Surgery

## 2024-09-07 ENCOUNTER — Other Ambulatory Visit: Payer: Self-pay

## 2024-09-07 ENCOUNTER — Encounter (HOSPITAL_COMMUNITY): Payer: Self-pay

## 2024-09-07 VITALS — BP 120/78 | HR 78 | Temp 98.3°F | Resp 17 | Ht 62.0 in | Wt 157.2 lb

## 2024-09-07 DIAGNOSIS — Z79899 Other long term (current) drug therapy: Secondary | ICD-10-CM | POA: Diagnosis not present

## 2024-09-07 DIAGNOSIS — E785 Hyperlipidemia, unspecified: Secondary | ICD-10-CM | POA: Diagnosis not present

## 2024-09-07 DIAGNOSIS — Z01812 Encounter for preprocedural laboratory examination: Secondary | ICD-10-CM | POA: Insufficient documentation

## 2024-09-07 DIAGNOSIS — Z01818 Encounter for other preprocedural examination: Secondary | ICD-10-CM

## 2024-09-07 DIAGNOSIS — I2510 Atherosclerotic heart disease of native coronary artery without angina pectoris: Secondary | ICD-10-CM | POA: Insufficient documentation

## 2024-09-07 HISTORY — DX: Unspecified osteoarthritis, unspecified site: M19.90

## 2024-09-07 LAB — BASIC METABOLIC PANEL WITH GFR
Anion gap: 9 (ref 5–15)
BUN: 18 mg/dL (ref 6–20)
CO2: 25 mmol/L (ref 22–32)
Calcium: 9 mg/dL (ref 8.9–10.3)
Chloride: 100 mmol/L (ref 98–111)
Creatinine, Ser: 0.6 mg/dL (ref 0.44–1.00)
GFR, Estimated: >60 mL/min (ref >60)
Glucose, Bld: 94 mg/dL (ref 70–99)
Potassium: 4.3 mmol/L (ref 3.5–5.1)
Sodium: 134 mmol/L — ABNORMAL LOW (ref 135–145)

## 2024-09-07 LAB — CBC
HCT: 44.6 % (ref 36.0–46.0)
Hemoglobin: 15.5 g/dL — ABNORMAL HIGH (ref 12.0–15.0)
MCH: 30.1 pg (ref 26.0–34.0)
MCHC: 34.8 g/dL (ref 30.0–36.0)
MCV: 86.6 fL (ref 80.0–100.0)
Platelets: 150 K/uL (ref 150–400)
RBC: 5.15 MIL/uL — ABNORMAL HIGH (ref 3.87–5.11)
RDW: 12.9 % (ref 11.5–15.5)
WBC: 4.8 K/uL (ref 4.0–10.5)
nRBC: 0 % (ref 0.0–0.2)

## 2024-09-07 LAB — SURGICAL PCR SCREEN
MRSA, PCR: NEGATIVE
Staphylococcus aureus: NEGATIVE

## 2024-09-07 NOTE — Progress Notes (Signed)
 PCP - Rosina Amy, PA Cardiologist - Dr. Dub Huntsman - last office visit 03/03/2024  PPM/ICD - Denies Device Orders - n/a Rep Notified - n/a  Chest x-ray - 08/29/2024 (CE) EKG - 03/03/2024 Stress Test - Denies ECHO - Denies Cardiac Cath - Denies  Sleep Study - Pending (ordered by cardiology) CPAP - n/a  No DM  Last dose of GLP1 agonist- n/a GLP1 instructions: n/a  Blood Thinner Instructions: n/a Aspirin  Instructions: n/a  ERAS Protcol - Clear liquids until 0530 morning of surgery PRE-SURGERY Ensure or G2- Ensure given to pt with instructions  COVID TEST- n/a   Anesthesia review: Yes. Pt endorses recent URI. Symptoms of non-productive cough and congestion started end of October, but even though symptoms were getting better, pt saw PCP on 11/4 and was prescribed prednisone and antibiotics. Pt completed both courses of medication. She denies any current URI symptoms and states the last time she had any cough was 11/5 or 11/6. Discussed with Lynwood Hope, PA-C   Patient denies shortness of breath, fever, cough and chest pain at PAT appointment   All instructions explained to the patient, with a verbal understanding of the material. Patient agrees to go over the instructions while at home for a better understanding. Patient also instructed to self quarantine after being tested for COVID-19. The opportunity to ask questions was provided.

## 2024-09-08 NOTE — Anesthesia Preprocedure Evaluation (Addendum)
 Anesthesia Evaluation  Patient identified by MRN, date of birth, ID band Patient awake    Reviewed: Allergy & Precautions, NPO status , Patient's Chart, lab work & pertinent test results  History of Anesthesia Complications Negative for: history of anesthetic complications  Airway Mallampati: III  TM Distance: >3 FB Neck ROM: Full    Dental  (+) Dental Advisory Given   Pulmonary neg shortness of breath, neg sleep apnea, neg COPD, Recent URI  (s/p antibiotics and prednisone), Resolved   Pulmonary exam normal breath sounds clear to auscultation       Cardiovascular (-) hypertension(-) angina (-) Past MI, (-) Cardiac Stents and (-) CABG (-) dysrhythmias  Rhythm:Regular Rate:Normal  HLD   Neuro/Psych negative neurological ROS     GI/Hepatic negative GI ROS, Neg liver ROS,,,  Endo/Other  negative endocrine ROS    Renal/GU negative Renal ROS     Musculoskeletal  (+) Arthritis , Osteoarthritis,    Abdominal   Peds  Hematology negative hematology ROS (+) Lab Results      Component                Value               Date                      WBC                      4.8                 09/07/2024                HGB                      15.5 (H)            09/07/2024                HCT                      44.6                09/07/2024                MCV                      86.6                09/07/2024                PLT                      150                 09/07/2024              Anesthesia Other Findings   Reproductive/Obstetrics                              Anesthesia Physical Anesthesia Plan  ASA: 2  Anesthesia Plan: Regional, Spinal and MAC   Post-op Pain Management: Regional block* and Tylenol  PO (pre-op)*   Induction: Intravenous  PONV Risk Score and Plan: 2 and Ondansetron , Dexamethasone , Treatment may vary due to age or medical condition, Midazolam  and Propofol   infusion  Airway Management Planned: Natural Airway and Simple Face Mask  Additional  Equipment:   Intra-op Plan:   Post-operative Plan:   Informed Consent: I have reviewed the patients History and Physical, chart, labs and discussed the procedure including the risks, benefits and alternatives for the proposed anesthesia with the patient or authorized representative who has indicated his/her understanding and acceptance.     Dental advisory given  Plan Discussed with: CRNA and Anesthesiologist  Anesthesia Plan Comments: (Discussed potential risks of nerve blocks including, but not limited to, infection, bleeding, nerve damage, seizures, pneumothorax, respiratory depression, and potential failure of the block. Alternatives to nerve blocks discussed. All questions answered.  I have discussed risks of neuraxial anesthesia including but not limited to infection, bleeding, nerve injury, back pain, headache, seizures, and failure of block. Patient denies bleeding disorders and is not currently anticoagulated. Labs have been reviewed. Risks and benefits discussed. All patient's questions answered.   Discussed with patient risks of MAC including, but not limited to, minor pain or discomfort, hearing people in the room, and possible need for backup general anesthesia. Risks for general anesthesia also discussed including, but not limited to, sore throat, hoarse voice, chipped/damaged teeth, injury to vocal cords, nausea and vomiting, allergic reactions, lung infection, heart attack, stroke, and death. All questions answered.   PAT note by Lynwood Hope, PA-C: 58 year old female recently established with cardiologist Dr. Sheena 03/03/2024 for history of hyperlipidemia.  She was started on Lipitor 10 mg daily.  There is also concern for suspected sleep apnea, testing was ordered but has not yet been completed.  She otherwise has no significant medical history.  She did report she was recently seen by her  PCP on 08/29/2024 for URI symptoms.  She says that at the time she was seen her symptoms were already improving but she wanted to be evaluated due to upcoming surgery.  CXR was negative for acute process.  She was prescribed azithromycin and prednisone for bronchitis.  She reports symptoms resolved quickly after starting medication.  She has now completed the medication and denies any respiratory symptoms, no cough or shortness of breath, overall reports feeling well.  History reviewed with Dr. Jefm, okay to proceed as planned barring acute status change.   Preop labs reviewed, mild hyponatremia sodium 134, otherwise unremarkable.  EKG 03/03/2024: NSR.  Rate 61.  )         Anesthesia Quick Evaluation

## 2024-09-08 NOTE — Progress Notes (Addendum)
 Anesthesia Chart Review:  58 year old female recently established with cardiologist Dr. Sheena 03/03/2024 for history of hyperlipidemia.  She was started on Lipitor 10 mg daily.  There is also concern for suspected sleep apnea, testing was ordered but has not yet been completed.  She otherwise has no significant medical history.  She did report she was recently seen by her PCP on 08/29/2024 for URI symptoms.  She says that at the time she was seen her symptoms were already improving but she wanted to be evaluated due to upcoming surgery.  CXR was negative for acute process.  She was prescribed azithromycin and prednisone for bronchitis.  She reports symptoms resolved quickly after starting medication.  She has now completed the medication and denies any respiratory symptoms, no cough or shortness of breath, overall reports feeling well.  History reviewed with Dr. Jefm, okay to proceed as planned barring acute status change.   Preop labs reviewed, mild hyponatremia sodium 134, otherwise unremarkable.  EKG 03/03/2024: NSR.  Rate 61.    Lynwood Geofm RIGGERS [facility] Short Stay Center/Anesthesiology Phone [phone] 09/08/2024 12:16 PM

## 2024-09-09 ENCOUNTER — Ambulatory Visit
Admission: RE | Admit: 2024-09-09 | Discharge: 2024-09-09 | Disposition: A | Payer: PRIVATE HEALTH INSURANCE | Source: Ambulatory Visit | Attending: Physician Assistant | Admitting: Physician Assistant

## 2024-09-09 ENCOUNTER — Ambulatory Visit
Admission: RE | Admit: 2024-09-09 | Discharge: 2024-09-09 | Disposition: A | Discharge status: Home / Self Care | Payer: PRIVATE HEALTH INSURANCE | Source: Ambulatory Visit | Attending: Physician Assistant | Admitting: Physician Assistant

## 2024-09-09 DIAGNOSIS — R928 Other abnormal and inconclusive findings on diagnostic imaging of breast: Secondary | ICD-10-CM

## 2024-09-11 ENCOUNTER — Observation Stay (HOSPITAL_COMMUNITY): Payer: PRIVATE HEALTH INSURANCE

## 2024-09-11 ENCOUNTER — Other Ambulatory Visit: Payer: Self-pay

## 2024-09-11 ENCOUNTER — Ambulatory Visit (HOSPITAL_COMMUNITY): Payer: PRIVATE HEALTH INSURANCE | Admitting: Physician Assistant

## 2024-09-11 ENCOUNTER — Ambulatory Visit (HOSPITAL_BASED_OUTPATIENT_CLINIC_OR_DEPARTMENT_OTHER): Payer: PRIVATE HEALTH INSURANCE | Admitting: Anesthesiology

## 2024-09-11 ENCOUNTER — Observation Stay (HOSPITAL_COMMUNITY)
Admission: RE | Admit: 2024-09-11 | Discharge: 2024-09-12 | Disposition: A | Payer: PRIVATE HEALTH INSURANCE | Attending: Orthopaedic Surgery | Admitting: Orthopaedic Surgery

## 2024-09-11 ENCOUNTER — Encounter (HOSPITAL_COMMUNITY)
Admission: RE | Disposition: A | Discharge status: Home / Self Care | Payer: Self-pay | Source: Home / Self Care | Attending: Orthopaedic Surgery

## 2024-09-11 ENCOUNTER — Other Ambulatory Visit: Payer: Self-pay | Admitting: Physician Assistant

## 2024-09-11 ENCOUNTER — Encounter (HOSPITAL_COMMUNITY): Payer: Self-pay | Admitting: Orthopaedic Surgery

## 2024-09-11 DIAGNOSIS — Z96651 Presence of right artificial knee joint: Secondary | ICD-10-CM | POA: Diagnosis not present

## 2024-09-11 DIAGNOSIS — Z79899 Other long term (current) drug therapy: Secondary | ICD-10-CM | POA: Insufficient documentation

## 2024-09-11 DIAGNOSIS — M1711 Unilateral primary osteoarthritis, right knee: Secondary | ICD-10-CM

## 2024-09-11 DIAGNOSIS — Z7982 Long term (current) use of aspirin: Secondary | ICD-10-CM | POA: Diagnosis not present

## 2024-09-11 HISTORY — PX: TOTAL KNEE ARTHROPLASTY: SHX125

## 2024-09-11 SURGERY — ARTHROPLASTY, KNEE, TOTAL
Anesthesia: Monitor Anesthesia Care | Site: Knee | Laterality: Right

## 2024-09-11 MED ORDER — PHENYLEPHRINE 80 MCG/ML (10ML) SYRINGE FOR IV PUSH (FOR BLOOD PRESSURE SUPPORT)
PREFILLED_SYRINGE | INTRAVENOUS | Status: DC | PRN
  Administered 2024-09-11: 160 ug via INTRAVENOUS

## 2024-09-11 MED ORDER — HYDROMORPHONE HCL 1 MG/ML IJ SOLN: 1.0000 mg | Freq: Three times a day (TID) | INTRAMUSCULAR | Status: DC | PRN

## 2024-09-11 MED ORDER — FENTANYL CITRATE (PF) 100 MCG/2ML IJ SOLN
INTRAMUSCULAR | Status: AC
  Filled 2024-09-11: qty 2

## 2024-09-11 MED ORDER — KETOROLAC TROMETHAMINE 30 MG/ML IJ SOLN
INTRAMUSCULAR | Status: AC
  Filled 2024-09-11: qty 1

## 2024-09-11 MED ORDER — ACETAMINOPHEN 325 MG PO TABS: 325.0000 mg | ORAL_TABLET | Freq: Four times a day (QID) | ORAL | Status: DC | PRN

## 2024-09-11 MED ORDER — TRANEXAMIC ACID 1000 MG/10ML IV SOLN
INTRAVENOUS | Status: DC | PRN
  Administered 2024-09-11: 2000 mg via TOPICAL

## 2024-09-11 MED ORDER — ORAL CARE MOUTH RINSE: 15.0000 mL | Freq: Once | OROMUCOSAL | Status: AC

## 2024-09-11 MED ORDER — POVIDONE-IODINE 10 % EX SWAB
2.0000 | Freq: Once | CUTANEOUS | Status: AC
  Administered 2024-09-11: 2 via TOPICAL

## 2024-09-11 MED ORDER — MENTHOL 3 MG MT LOZG
1.0000 | LOZENGE | OROMUCOSAL | Status: DC | PRN
  Filled 2024-09-11: qty 9

## 2024-09-11 MED ORDER — TRANEXAMIC ACID-NACL 1000-0.7 MG/100ML-% IV SOLN
1000.0000 mg | INTRAVENOUS | Status: AC
  Administered 2024-09-11: 1000 mg via INTRAVENOUS
  Filled 2024-09-11: qty 100

## 2024-09-11 MED ORDER — SODIUM CHLORIDE 0.9 % IV SOLN: INTRAVENOUS | Status: DC

## 2024-09-11 MED ORDER — BUPIVACAINE IN DEXTROSE 0.75-8.25 % IT SOLN
INTRATHECAL | Status: DC | PRN
  Administered 2024-09-11: 1.6 mL via INTRATHECAL

## 2024-09-11 MED ORDER — BUPIVACAINE-MELOXICAM ER 400-12 MG/14ML IJ SOLN
INTRAMUSCULAR | Status: AC
  Filled 2024-09-11: qty 1

## 2024-09-11 MED ORDER — CEFAZOLIN SODIUM-DEXTROSE 2-4 GM/100ML-% IV SOLN
2.0000 g | Freq: Four times a day (QID) | INTRAVENOUS | Status: AC
  Administered 2024-09-11 (×2): 2 g via INTRAVENOUS
  Filled 2024-09-11 (×2): qty 100

## 2024-09-11 MED ORDER — AMISULPRIDE (ANTIEMETIC) 5 MG/2ML IV SOLN: 10.0000 mg | Freq: Once | INTRAVENOUS | Status: DC | PRN

## 2024-09-11 MED ORDER — LACTATED RINGERS IV SOLN: INTRAVENOUS | Status: DC

## 2024-09-11 MED ORDER — METOCLOPRAMIDE HCL 5 MG/ML IJ SOLN
5.0000 mg | Freq: Three times a day (TID) | INTRAMUSCULAR | Status: DC | PRN
  Administered 2024-09-11: 10 mg via INTRAVENOUS
  Filled 2024-09-11: qty 2

## 2024-09-11 MED ORDER — PHENOL 1.4 % MT LIQD: 1.0000 | OROMUCOSAL | Status: DC | PRN

## 2024-09-11 MED ORDER — HYDROMORPHONE HCL 1 MG/ML IJ SOLN
0.2500 mg | INTRAMUSCULAR | Status: DC | PRN
  Administered 2024-09-11 (×2): 0.5 mg via INTRAVENOUS

## 2024-09-11 MED ORDER — ASPIRIN 81 MG PO CHEW
81.0000 mg | CHEWABLE_TABLET | Freq: Two times a day (BID) | ORAL | Status: DC
  Administered 2024-09-11 – 2024-09-12 (×2): 81 mg via ORAL
  Filled 2024-09-11 (×2): qty 1

## 2024-09-11 MED ORDER — BUPIVACAINE-MELOXICAM ER 400-12 MG/14ML IJ SOLN
INTRAMUSCULAR | Status: DC | PRN
  Administered 2024-09-11: 400 mg

## 2024-09-11 MED ORDER — ONDANSETRON HCL 4 MG/2ML IJ SOLN
INTRAMUSCULAR | Status: DC | PRN
  Administered 2024-09-11: 4 mg via INTRAVENOUS

## 2024-09-11 MED ORDER — OXYCODONE HCL 5 MG PO TABS
ORAL_TABLET | ORAL | Status: AC
  Filled 2024-09-11: qty 1

## 2024-09-11 MED ORDER — 0.9 % SODIUM CHLORIDE (POUR BTL) OPTIME
TOPICAL | Status: DC | PRN
  Administered 2024-09-11: 1000 mL

## 2024-09-11 MED ORDER — TRANEXAMIC ACID 1000 MG/10ML IV SOLN
2000.0000 mg | INTRAVENOUS | Status: DC
  Filled 2024-09-11: qty 20

## 2024-09-11 MED ORDER — OXYCODONE HCL 5 MG PO TABS
5.0000 mg | ORAL_TABLET | Freq: Once | ORAL | Status: AC | PRN
  Administered 2024-09-11: 5 mg via ORAL

## 2024-09-11 MED ORDER — VANCOMYCIN HCL 1000 MG IV SOLR
INTRAVENOUS | Status: DC | PRN
  Administered 2024-09-11: 1000 mg via TOPICAL

## 2024-09-11 MED ORDER — METHOCARBAMOL 1000 MG/10ML IJ SOLN
500.0000 mg | Freq: Four times a day (QID) | INTRAMUSCULAR | Status: DC | PRN
Start: 2024-09-11 — End: 2024-09-12

## 2024-09-11 MED ORDER — METHOCARBAMOL 500 MG PO TABS
ORAL_TABLET | ORAL | Status: AC
  Filled 2024-09-11: qty 1

## 2024-09-11 MED ORDER — HYDROXYZINE HCL 50 MG/ML IM SOLN
50.0000 mg | Freq: Four times a day (QID) | INTRAMUSCULAR | Status: DC | PRN
  Administered 2024-09-11: 50 mg via INTRAMUSCULAR
  Filled 2024-09-11: qty 1

## 2024-09-11 MED ORDER — OXYCODONE HCL 5 MG/5ML PO SOLN: 5.0000 mg | Freq: Once | ORAL | Status: AC | PRN

## 2024-09-11 MED ORDER — ASPIRIN 81 MG PO CHEW: 81.0000 mg | CHEWABLE_TABLET | Freq: Two times a day (BID) | ORAL | 0 refills | Status: DC

## 2024-09-11 MED ORDER — METOCLOPRAMIDE HCL 5 MG PO TABS: 5.0000 mg | ORAL_TABLET | Freq: Three times a day (TID) | ORAL | Status: DC | PRN

## 2024-09-11 MED ORDER — DEXAMETHASONE SOD PHOSPHATE PF 10 MG/ML IJ SOLN
10.0000 mg | Freq: Once | INTRAMUSCULAR | Status: AC
  Administered 2024-09-12: 10 mg via INTRAVENOUS

## 2024-09-11 MED ORDER — HYDROMORPHONE HCL 1 MG/ML IJ SOLN
INTRAMUSCULAR | Status: AC
  Filled 2024-09-11: qty 1

## 2024-09-11 MED ORDER — ROPIVACAINE HCL 5 MG/ML IJ SOLN
INTRAMUSCULAR | Status: DC | PRN
  Administered 2024-09-11: 20 mL via PERINEURAL

## 2024-09-11 MED ORDER — ONDANSETRON HCL 4 MG PO TABS
4.0000 mg | ORAL_TABLET | Freq: Four times a day (QID) | ORAL | Status: DC | PRN
  Administered 2024-09-11 – 2024-09-12 (×2): 4 mg via ORAL
  Filled 2024-09-11 (×2): qty 1

## 2024-09-11 MED ORDER — PRONTOSAN WOUND IRRIGATION OPTIME
TOPICAL | Status: DC | PRN
  Administered 2024-09-11: 1 via TOPICAL

## 2024-09-11 MED ORDER — TRANEXAMIC ACID-NACL 1000-0.7 MG/100ML-% IV SOLN
1000.0000 mg | Freq: Once | INTRAVENOUS | Status: AC
  Administered 2024-09-11: 1000 mg via INTRAVENOUS
  Filled 2024-09-11: qty 100

## 2024-09-11 MED ORDER — VANCOMYCIN HCL 1000 MG IV SOLR
INTRAVENOUS | Status: AC
  Filled 2024-09-11: qty 20

## 2024-09-11 MED ORDER — ACETAMINOPHEN 500 MG PO TABS
1000.0000 mg | ORAL_TABLET | Freq: Once | ORAL | Status: AC
  Administered 2024-09-11: 1000 mg via ORAL
  Filled 2024-09-11: qty 2

## 2024-09-11 MED ORDER — CHLORHEXIDINE GLUCONATE 0.12 % MT SOLN
15.0000 mL | Freq: Once | OROMUCOSAL | Status: AC
  Administered 2024-09-11: 15 mL via OROMUCOSAL
  Filled 2024-09-11: qty 15

## 2024-09-11 MED ORDER — KETOROLAC TROMETHAMINE 15 MG/ML IJ SOLN
7.5000 mg | Freq: Four times a day (QID) | INTRAMUSCULAR | Status: AC
Start: 2024-09-11 — End: 2024-09-12
  Administered 2024-09-11 – 2024-09-12 (×4): 7.5 mg via INTRAVENOUS
  Filled 2024-09-11 (×3): qty 1

## 2024-09-11 MED ORDER — ACETAMINOPHEN 500 MG PO TABS
1000.0000 mg | ORAL_TABLET | Freq: Four times a day (QID) | ORAL | Status: AC
  Administered 2024-09-11 – 2024-09-12 (×4): 1000 mg via ORAL
  Filled 2024-09-11 (×4): qty 2

## 2024-09-11 MED ORDER — ONDANSETRON HCL 4 MG/2ML IJ SOLN
INTRAMUSCULAR | Status: AC
  Filled 2024-09-11: qty 2

## 2024-09-11 MED ORDER — CEFAZOLIN SODIUM-DEXTROSE 2-4 GM/100ML-% IV SOLN
2.0000 g | INTRAVENOUS | Status: AC
  Administered 2024-09-11: 2 g via INTRAVENOUS
  Filled 2024-09-11: qty 100

## 2024-09-11 MED ORDER — ONDANSETRON HCL 4 MG/2ML IJ SOLN: 4.0000 mg | Freq: Four times a day (QID) | INTRAMUSCULAR | Status: DC | PRN

## 2024-09-11 MED ORDER — DOCUSATE SODIUM 100 MG PO CAPS
100.0000 mg | ORAL_CAPSULE | Freq: Two times a day (BID) | ORAL | Status: DC
  Administered 2024-09-11 – 2024-09-12 (×3): 100 mg via ORAL
  Filled 2024-09-11 (×3): qty 1

## 2024-09-11 MED ORDER — OXYCODONE HCL 5 MG PO TABS
10.0000 mg | ORAL_TABLET | Freq: Four times a day (QID) | ORAL | Status: DC | PRN
  Administered 2024-09-12 (×2): 10 mg via ORAL
  Filled 2024-09-11 (×2): qty 2

## 2024-09-11 MED ORDER — SODIUM CHLORIDE 0.9 % IR SOLN
Status: DC | PRN
  Administered 2024-09-11: 1000 mL

## 2024-09-11 MED ORDER — METHOCARBAMOL 500 MG PO TABS
500.0000 mg | ORAL_TABLET | Freq: Four times a day (QID) | ORAL | Status: DC | PRN
Start: 2024-09-11 — End: 2024-09-12
  Administered 2024-09-11 – 2024-09-12 (×4): 500 mg via ORAL
  Filled 2024-09-11 (×3): qty 1

## 2024-09-11 MED ORDER — PROPOFOL 500 MG/50ML IV EMUL
INTRAVENOUS | Status: DC | PRN
  Administered 2024-09-11: 25 mg via INTRAVENOUS
  Administered 2024-09-11: 115 ug/kg/min via INTRAVENOUS

## 2024-09-11 MED ORDER — OXYCODONE HCL 5 MG PO TABS
5.0000 mg | ORAL_TABLET | Freq: Four times a day (QID) | ORAL | Status: DC | PRN
  Administered 2024-09-11: 5 mg via ORAL
  Filled 2024-09-11: qty 1

## 2024-09-11 MED ORDER — MIDAZOLAM HCL (PF) 2 MG/2ML IJ SOLN: 2.0000 mg | Freq: Once | INTRAMUSCULAR | Status: AC

## 2024-09-11 MED ORDER — MIDAZOLAM HCL 2 MG/2ML IJ SOLN
INTRAMUSCULAR | Status: AC
  Administered 2024-09-11: 2 mg via INTRAVENOUS
  Filled 2024-09-11: qty 2

## 2024-09-11 SURGICAL SUPPLY — 64 items
ALCOHOL 70% 16 OZ (MISCELLANEOUS) ×1 IMPLANT
BAG COUNTER SPONGE SURGICOUNT (BAG) ×1 IMPLANT
BAG DECANTER FOR FLEXI CONT (MISCELLANEOUS) ×1 IMPLANT
BLADE SAG 18X100X1.27 (BLADE) ×1 IMPLANT
BLADE SAW SAG 90X13X1.27 (BLADE) ×1 IMPLANT
BLADE SAW SGTL 73X25 THK (BLADE) ×1 IMPLANT
BNDG COMPR ESMARK 6X3 LF (GAUZE/BANDAGES/DRESSINGS) IMPLANT
BOWL SMART MIX CTS (DISPOSABLE) IMPLANT
CLSR STERI-STRIP ANTIMIC 1/2X4 (GAUZE/BANDAGES/DRESSINGS) IMPLANT
COMPONENT FEM KNEE STD PS 5 RT (Joint) IMPLANT
COMPONENT PATELLA 3 PEG 29X9 (Joint) IMPLANT
COMPONENT TIB KNEE PS C 0D RT (Knees) IMPLANT
COOLER ICEMAN CLASSIC (MISCELLANEOUS) ×1 IMPLANT
COVER SURGICAL LIGHT HANDLE (MISCELLANEOUS) ×1 IMPLANT
CUFF TOURN SGL QUICK 42 (TOURNIQUET CUFF) IMPLANT
CUFF TRNQT CYL 34X4.125X (TOURNIQUET CUFF) ×1 IMPLANT
DERMABOND ADVANCED .7 DNX12 (GAUZE/BANDAGES/DRESSINGS) ×1 IMPLANT
DRAPE EXTREMITY T 121X128X90 (DISPOSABLE) ×1 IMPLANT
DRAPE HALF SHEET 40X57 (DRAPES) ×1 IMPLANT
DRAPE INCISE IOBAN 66X45 STRL (DRAPES) ×1 IMPLANT
DRAPE POUCH INSTRU U-SHP 10X18 (DRAPES) ×1 IMPLANT
DRAPE SURG ORHT 6 SPLT 77X108 (DRAPES) IMPLANT
DRAPE U-SHAPE 47X51 STRL (DRAPES) ×2 IMPLANT
DRSG AQUACEL AG ADV 3.5X10 (GAUZE/BANDAGES/DRESSINGS) ×1 IMPLANT
DURAPREP 26ML APPLICATOR (WOUND CARE) ×3 IMPLANT
ELECT CAUTERY BLADE 6.4 (BLADE) ×1 IMPLANT
ELECT PENCIL ROCKER SW 15FT (MISCELLANEOUS) ×1 IMPLANT
ELECTRODE REM PT RTRN 9FT ADLT (ELECTROSURGICAL) ×1 IMPLANT
GLOVE BIOGEL PI IND STRL 7.5 (GLOVE) ×1 IMPLANT
GLOVE ECLIPSE 7.0 STRL STRAW (GLOVE) ×5 IMPLANT
GLOVE INDICATOR 7.0 STRL GRN (GLOVE) ×1 IMPLANT
GLOVE SURG SYN 7.5 PF PI (GLOVE) ×5 IMPLANT
GOWN STRL REUS W/ TWL LRG LVL3 (GOWN DISPOSABLE) ×2 IMPLANT
GOWN TOGA ZIPPER T7+ PEEL AWAY (MISCELLANEOUS) ×1 IMPLANT
HOOD PEEL AWAY T7 (MISCELLANEOUS) ×1 IMPLANT
INSERT TIB ASF PS 13 4-5/CD RT (Insert) IMPLANT
IV 0.9% NACL 1000 ML (IV SOLUTION) ×1 IMPLANT
KIT BASIN OR (CUSTOM PROCEDURE TRAY) ×1 IMPLANT
KIT TURNOVER KIT B (KITS) ×1 IMPLANT
MANIFOLD NEPTUNE II (INSTRUMENTS) ×1 IMPLANT
MARKER SKIN DUAL TIP RULER LAB (MISCELLANEOUS) ×2 IMPLANT
NDL SPNL 18GX3.5 QUINCKE PK (NEEDLE) ×1 IMPLANT
NEEDLE SPNL 18GX3.5 QUINCKE PK (NEEDLE) ×1 IMPLANT
PACK TOTAL JOINT (CUSTOM PROCEDURE TRAY) ×1 IMPLANT
PAD ARMBOARD POSITIONER FOAM (MISCELLANEOUS) ×2 IMPLANT
PAD COLD SHLDR WRAP-ON (PAD) ×1 IMPLANT
PIN DRILL HDLS TROCAR 75 4PK (PIN) IMPLANT
SCREW FEMALE HEX FIX 25X2.5 (ORTHOPEDIC DISPOSABLE SUPPLIES) IMPLANT
SET HNDPC FAN SPRY TIP SCT (DISPOSABLE) ×1 IMPLANT
SOLN 0.9% NACL POUR BTL 1000ML (IV SOLUTION) ×1 IMPLANT
SOLUTION PRONTOSAN WOUND 350ML (IRRIGATION / IRRIGATOR) ×1 IMPLANT
STAPLER SKIN PROX 35W (STAPLE) IMPLANT
SUCTION TUBE FRAZIER 10FR DISP (SUCTIONS) IMPLANT
SUT ETHILON 2 0 FS 18 (SUTURE) ×2 IMPLANT
SUT STRATAFIX PDS+ 0 24IN (SUTURE) ×1 IMPLANT
SUT VIC AB 0 CT1 27XBRD ANBCTR (SUTURE) ×1 IMPLANT
SUT VIC AB 1 CTX 27 (SUTURE) IMPLANT
SUT VIC AB 2-0 CT1 TAPERPNT 27 (SUTURE) ×3 IMPLANT
SYR 30ML LL (SYRINGE) ×2 IMPLANT
TOWEL GREEN STERILE (TOWEL DISPOSABLE) ×1 IMPLANT
TOWEL GREEN STERILE FF (TOWEL DISPOSABLE) ×1 IMPLANT
TUBE SUCT ARGYLE STRL (TUBING) ×1 IMPLANT
UNDERPAD 30X36 HEAVY ABSORB (UNDERPADS AND DIAPERS) ×1 IMPLANT
YANKAUER SUCT BULB TIP NO VENT (SUCTIONS) ×1 IMPLANT

## 2024-09-11 NOTE — Anesthesia Procedure Notes (Signed)
 Anesthesia Regional Block: Adductor canal block   Pre-Anesthetic Checklist: , timeout performed,  Correct Patient, Correct Site, Correct Laterality,  Correct Procedure, Correct Position, site marked,  Risks and benefits discussed,  Surgical consent,  Pre-op evaluation,  At surgeon's request and post-op pain management  Laterality: Right  Prep: chloraprep       Needles:  Injection technique: Single-shot  Needle Type: Echogenic Stimulator Needle     Needle Length: 9cm  Needle Gauge: 21     Additional Needles:   Procedures:,,,, ultrasound used (permanent image in chart),,    Narrative:  Start time: 09/11/2024 7:48 AM End time: 09/11/2024 7:52 AM Injection made incrementally with aspirations every 5 mL.  Performed by: Personally  Anesthesiologist: Peggye Delon Brunswick, MD  Additional Notes: Discussed risks and benefits of nerve block including, but not limited to, prolonged and/or permanent nerve injury involving sensory and/or motor function. Monitors were applied and a time-out was performed. The nerve and associated structures were visualized under ultrasound guidance. After negative aspiration, local anesthetic was slowly injected around the nerve. There was no evidence of high pressure during the procedure. There were no paresthesias. VSS remained stable and the patient tolerated the procedure well.

## 2024-09-11 NOTE — Plan of Care (Signed)

## 2024-09-11 NOTE — Progress Notes (Signed)
 Orthopedic Tech Progress Note Patient Details:  Cheryl Bush Oct 16, 1966 [id number]  Ortho Devices Type of Ortho Device: Bone foam zero knee Ortho Device/Splint Interventions: Ordered   Post Interventions Patient Tolerated: Well  Cheryl Bush 09/11/2024, 1:03 PM

## 2024-09-11 NOTE — Discharge Instructions (Addendum)

## 2024-09-11 NOTE — Anesthesia Procedure Notes (Signed)
 Spinal  Patient location during procedure: OR Start time: 09/11/2024 8:36 AM End time: 09/11/2024 8:40 AM Reason for block: surgical anesthesia Staffing Performed: anesthesiologist  Anesthesiologist: Peggye Delon Brunswick, MD Performed by: Peggye Delon Brunswick, MD Authorized by: Peggye Delon Brunswick, MD   Preanesthetic Checklist Completed: patient identified, IV checked, site marked, risks and benefits discussed, surgical consent, monitors and equipment checked, pre-op evaluation and timeout performed Spinal Block Patient position: sitting Prep: ChloraPrep Patient monitoring: blood pressure and continuous pulse ox Approach: midline Location: L3-4 Injection technique: single-shot Needle Needle type: Pencan  Needle gauge: 24 G Needle length: 9 cm Additional Notes Risks and benefits of neuraxial anesthesia including, but not limited to, infection, bleeding, local anesthetic toxicity, headache, hypotension, back pain, block failure, etc. were discussed with the patient. The patient expressed understanding and consented to the procedure. I confirmed that the patient has no bleeding disorders and is not taking blood thinners. I confirmed the patient's last platelet count with the nurse. Monitors were applied. A time-out was performed immediately prior to the procedure. Sterile technique was used throughout the whole procedure.   1 attempt(s)

## 2024-09-11 NOTE — H&P (Signed)
 PREOPERATIVE H&P  Chief Complaint: osteoarthritis of right knee  HPI: Cheryl Bush is a 58 y.o. female who presents for surgical treatment of osteoarthritis of right knee.  She denies any changes in medical history.  Past Surgical History:  Procedure Laterality Date  . KNEE SURGERY     left- arthroscopy  . TOTAL KNEE ARTHROPLASTY Left 04/23/2023   Procedure: LEFT TOTAL KNEE ARTHROPLASTY;  Surgeon: Jerri Kay HERO, MD;  Location: [facility] OR;  Service: Orthopedics;  Laterality: Left;   Social History   Socioeconomic History  . Marital status: Married    Spouse name: Not on file  . Number of children: 1  . Years of education: Not on file  . Highest education level: Not on file  Occupational History  . Not on file  Tobacco Use  . Smoking status: Never  . Smokeless tobacco: Never  Vaping Use  . Vaping status: Never Used  Substance and Sexual Activity  . Alcohol use: No  . Drug use: No  . Sexual activity: Never  Other Topics Concern  . Not on file  Social History Narrative  . Not on file   Social Drivers of Health   Financial Resource Strain: Not on file  Food Insecurity: Patient Declined (12/03/2023)   Hunger Vital Sign   . Worried About Programme Researcher, Broadcasting/film/video in the Last Year: Patient declined   . Ran Out of Food in the Last Year: Patient declined  Transportation Needs: No Transportation Needs (12/03/2023)   PRAPARE - Transportation   . Lack of Transportation (Medical): No   . Lack of Transportation (Non-Medical): No  Physical Activity: Not on file  Stress: Not on file  Social Connections: Not on file   Family History  Problem Relation Age of Onset  . Heart disease Brother   . Breast cancer Neg Hx    No Known Allergies Prior to Admission medications   Medication Sig Start Date End Date Taking? Authorizing Provider  docusate sodium  (COLACE) 100 MG capsule Take 1 capsule (100 mg total) by mouth daily as needed. Patient not taking: Reported on 09/04/2024 08/22/24  08/22/25  Jule Ronal CROME, PA-C  methocarbamol  (ROBAXIN ) 750 MG tablet Take 1 tablet (750 mg total) by mouth 3 (three) times daily as needed. Patient not taking: Reported on 09/04/2024 08/22/24   Jule Ronal CROME, PA-C  ondansetron  (ZOFRAN ) 4 MG tablet Take 1 tablet (4 mg total) by mouth every 8 (eight) hours as needed for nausea or vomiting. Patient not taking: Reported on 09/04/2024 08/22/24   Jule Ronal CROME, PA-C  oxyCODONE -acetaminophen  (PERCOCET) 5-325 MG tablet Take 1-2 tablets by mouth every 6 (six) hours as needed. To be taken after surgery Patient not taking: Reported on 09/04/2024 08/22/24   Jule Ronal CROME, PA-C  atorvastatin  (LIPITOR) 10 MG tablet Take 1 tablet (10 mg total) by mouth daily. 03/03/24 06/01/24  Tobb, Kardie, DO  celecoxib  (CELEBREX ) 200 MG capsule Take 1 capsule (200 mg total) by mouth 2 (two) times daily. Patient not taking: Reported on 03/03/2024 06/02/23   Jerri Kay HERO, MD  ibuprofen  (ADVIL ) 800 MG tablet Take 1 tablet (800 mg total) by mouth every 8 (eight) hours as needed. Patient not taking: Reported on 03/03/2024 11/16/23   Jerri Kay HERO, MD  LOVAZA 1 g capsule Take 2 g by mouth every other day. Patient not taking: Reported on 03/03/2024    [provider]  methocarbamol  (ROBAXIN -750) 750 MG tablet Take 1 tablet (750 mg total) by mouth 2 (  two) times daily as needed for muscle spasms. Patient not taking: Reported on 03/03/2024 05/04/23   Jule Ronal CROME, PA-C  ondansetron  (ZOFRAN ) 4 MG tablet Take 1 tablet (4 mg total) by mouth every 8 (eight) hours as needed for nausea or vomiting. Patient not taking: Reported on 03/03/2024 04/19/23   Jule Ronal CROME, PA-C  oxyCODONE -acetaminophen  (PERCOCET) 5-325 MG tablet Take 1-2 tablets by mouth every 8 (eight) hours as needed. To be taken after surgery Patient not taking: Reported on 03/03/2024 05/07/23   Jule Ronal CROME, PA-C     Positive ROS: All other systems have been reviewed and were otherwise negative with the exception  of those mentioned in the HPI and as above.  Physical Exam: General: Alert, no acute distress Cardiovascular: No pedal edema Respiratory: No cyanosis, no use of accessory musculature GI: abdomen soft Skin: No lesions in the area of chief complaint Neurologic: Sensation intact distally Psychiatric: Patient is competent for consent with normal mood and affect Lymphatic: no lymphedema  MUSCULOSKELETAL: exam stable  Assessment: osteoarthritis of right knee  Plan: Plan for Procedure(s): ARTHROPLASTY, KNEE, TOTAL  The risks benefits and alternatives were discussed with the patient including but not limited to the risks of nonoperative treatment, versus surgical intervention including infection, bleeding, nerve injury,  blood clots, cardiopulmonary complications, morbidity, mortality, among others, and they were willing to proceed.   Ozell Cummins, MD 09/11/2024 6:12 AM

## 2024-09-11 NOTE — Op Note (Signed)
 Total Knee Arthroplasty Procedure Note  Preoperative diagnosis: Right knee osteoarthritis  Postoperative diagnosis:same  Operative findings: Complete loss of articular cartilage from all joint surfaces Mild varus deformity  Operative procedure: Right total knee arthroplasty. CPT [phone]  Surgeon: N. Ozell Cummins, MD  Assist: Ronal Morna Grave, PA-C; necessary for the timely completion of procedure and due to complexity of procedure.  Anesthesia: Spinal, regional  Tourniquet time: see anesthesia record  Implants used: Zimmer persona press fit Femur: CR 5 Tibia: C Patella: 29 mm Polyethylene: 13 mm medial congruent  Indication: Cheryl Bush is a 58 y.o. year old female with a history of knee pain. Having failed conservative management, the patient elected to proceed with a total knee arthroplasty.  We have reviewed the risk and benefits of the surgery and they elected to proceed after voicing understanding.  Procedure:  After informed consent was obtained and understanding of the risk were voiced including but not limited to bleeding, infection, damage to surrounding structures including nerves and vessels, blood clots, leg length inequality and the failure to achieve desired results, the operative extremity was marked with verbal confirmation of the patient in the holding area.   The patient was then brought to the operating room and transported to the operating room table in the supine position.  A tourniquet was applied to the operative extremity around the upper thigh. The operative limb was then prepped and draped in the usual sterile fashion and preoperative antibiotics were administered.  A time out was performed prior to the start of surgery confirming the correct extremity, preoperative antibiotic administration, as well as team members, implants and instruments available for the case. Correct surgical site was also confirmed with preoperative radiographs. The limb was  then elevated for exsanguination and the tourniquet was inflated. A midline incision was made and a standard medial parapatellar approach was performed.  The patella was everted which showed complete loss of articular cartilage.  The patella was resected down to 13 mm and sized to a 29 mm.  A cover was placed on the patella for protection from retractors.  We then turned our attention to the femur.  The ACL was sacrificed. Start site was drilled in the femur and the intramedullary distal femoral cutting guide was placed, set at 5 degrees valgus, taking 10 mm of distal resection. The distal cut was made. Osteophytes were then removed.   Next, the proximal tibial cutting guide was placed with appropriate slope, varus/valgus alignment and depth of resection. A drop rod was attached to confirm that it was pointed to the second metatarsal.  The proximal tibial cut was made taking 4 mm off the low side. Gap blocks were then used to assess the extension gap and alignment, and appropriate soft tissue releases were performed. Attention was turned back to the femur, which was sized using the sizing guide to a size 5 standard. Appropriate rotation of the femoral component was determined using epicondylar axis, Whiteside's line, and assessing the flexion gap under ligament tension. The appropriate size 4-in-1 cutting block was placed and cuts were made.  Posterior femoral osteophytes and uncapped bone were then removed with the curved osteotome.  Trial components were placed, and stability was checked in full extension, mid-flexion, and deep flexion.  The PCL was resected to balance the flexion space.  The patella tracked well without a lateral release. Trial components were then removed and tibial preparation performed.  The tibial trial was pointed to the medial third of the tibial tubercle.  The tibia was sized for a size C component and prepped.  Trial components were removed.   The bony surfaces were irrigated with a  pulse lavage and then dried. Final components were placed.  The final polyethylene liner, 13 mm thick, was inserted and checked to ensure the locking mechanism had engaged appropriately.  The stability of the construct was re-evaluated throughout a range of motion and found to be acceptable.  The tourniquet was deflated and hemostasis was achieved. The wound was irrigated with normal saline.  One gram of vancomycin  powder was placed in the surgical bed.  Topical mixture of 0.25% bupivacaine  and meloxicam  was placed in the joint for postoperative pain.  Capsular closure was performed with a #1 statafix in flexion, subcutaneous fat closed with a 0 vicryl suture, then subcutaneous tissue closed with interrupted 2.0 vicryl suture. The skin was then closed with a 3.0 monocryl and steri strips. A sterile dressing was applied.  The patient was awakened in the operating room and taken to recovery in stable condition. All sponge, needle, and instrument counts were correct at the end of the case.  Morna Grave was necessary for opening, closing, retracting, limb positioning and overall facilitation and completion of the surgery.  Position: supine  Complications: none.  Time Out: performed  Drains/Packing: none Estimated blood loss: minimal Returned to Recovery Room: in good condition.   Mechanical VTE (DVT) Prophylaxis: sequential compression devices, TED thigh-high  Chemical VTE (DVT) Prophylaxis: aspirin  POD 0  Fluid Replacement  Crystalloid: see anesthesia record Blood: none  FFP: none   Specimens Removed: 1 to pathology  Sponge and Instrument Count Correct? yes  PACU: portable radiograph - knee AP and Lateral  Plan/RTC: Return in 2 weeks for suture removal.  Weight Bearing/Load Lower Extremity: full   Implant Name Type Inv. Item Serial No. Manufacturer Lot No. LRB No. Used Action  COMPONENT TIB KNEE PS C 0D RT - [id number] Knees COMPONENT TIB KNEE PS C 0D RT  ZIMMER RECON(ORTH,TRAU,BIO,SG)  [id number] Right 1 Implanted  COMPONENT PATELLA 3 PEG 29X9 - [id number] Joint COMPONENT PATELLA 3 PEG 29X9  ZIMMER [employer]ON(ORTH,TRAU,BIO,SG) [id number] Right 1 Implanted  COMPONENT FEM KNEE STD PS 5 RT - [id number] Joint COMPONENT FEM KNEE STD PS 5 RT  ZIMMER [employer]ON(ORTH,TRAU,BIO,SG) [id number] Right 1 Implanted  INSERT TIB ASF PS 13 4-5/CD RT - [id number] Insert INSERT TIB ASF PS 13 4-5/CD RT  ZIMMER RECON(ORTH,TRAU,BIO,SG) [id number] Right 1 Implanted    N. Ozell Cummins, MD [facility] 9:45 AM

## 2024-09-11 NOTE — Anesthesia Postprocedure Evaluation (Signed)
 Anesthesia Post Note  Patient: Cheryl Bush  Procedure(s) Performed: ARTHROPLASTY, KNEE, TOTAL (Right: Knee)     Patient location during evaluation: PACU Anesthesia Type: Regional and Spinal Level of consciousness: awake Pain management: pain level controlled Vital Signs Assessment: post-procedure vital signs reviewed and stable Respiratory status: spontaneous breathing, respiratory function stable and nonlabored ventilation Cardiovascular status: blood pressure returned to baseline and stable Postop Assessment: no headache, no backache and no apparent nausea or vomiting Anesthetic complications: no   No notable events documented.  Last Vitals:  Vitals:   09/11/24 1303 09/11/24 1305  BP: (!) 146/82 (!) 146/82  Pulse: 63 60  Resp:  17  Temp:  36.5 C  SpO2: 99% 99%    Last Pain:  Vitals:   09/11/24 1305  TempSrc: Oral  PainSc:                  Delon Aisha Arch

## 2024-09-11 NOTE — Evaluation (Signed)
 Physical Therapy Evaluation Patient Details Name: Cheryl Bush MRN: [id number] DOB: July 30, 1966 Today's Date: 09/11/2024  History of Present Illness  Pt is 58 yo presenting to [facility] on 11/17 for schedule R TKA. PMH: HLD.  Clinical Impression  Currently pt is mod I for bed mobility, Cga for sit to stand and 120 ft of gait with RW. Pt has 2 steps at home but was unable to attempt today due to nausea/vomiting. Pt demonstrates good strength and has sufficient help at home in order to perform stairs per home set up. Spouse/son present during session and supportive. Pt will benefit from continued skilled physical therapy services in acute care hospital setting at this time. Recommending to follow physician recommendation for physical therapy once discharged from acute care hospital setting.          If plan is discharge home, recommend the following: Help with stairs or ramp for entrance;Assistance with cooking/housework;Assist for transportation     Equipment Recommendations None recommended by PT     Functional Status Assessment Patient has had a recent decline in their functional status and demonstrates the ability to make significant improvements in function in a reasonable and predictable amount of time.     Precautions / Restrictions Precautions Precautions: Fall;Knee Precaution Booklet Issued: No Recall of Precautions/Restrictions: Intact Restrictions Weight Bearing Restrictions Per Provider Order: No      Mobility  Bed Mobility Overal bed mobility: Modified Independent             General bed mobility comments: increased time for supine<>sitting    Transfers Overall transfer level: Needs assistance Equipment used: Rolling walker (2 wheels) Transfers: Sit to/from Stand Sit to Stand: Contact guard assist           General transfer comment: CGA for safety, dizzy initially on standing did not get worse    Ambulation/Gait Ambulation/Gait assistance: Contact guard  assist Gait Distance (Feet): 120 Feet Assistive device: Rolling walker (2 wheels) Gait Pattern/deviations: Step-through pattern, Decreased stance time - right, Decreased step length - right, Antalgic Gait velocity: decreased Gait velocity interpretation: <1.31 ft/sec, indicative of household ambulator   General Gait Details: Minimal UE support on RW, decreased knee flexion with gait on the R due to TKA  Stairs Stairs:  (pt declined due to nausea; demonstrates good strength to perform per home set up with assist.)            Balance Overall balance assessment: Mild deficits observed, not formally tested       Pertinent Vitals/Pain Pain Assessment Pain Assessment: Faces Faces Pain Scale: Hurts a little bit Pain Location: R knee Pain Descriptors / Indicators: Aching, Discomfort Pain Intervention(s): Limited activity within patient's tolerance, Monitored during session    Home Living Family/patient expects to be discharged to:: Private residence Living Arrangements: Spouse/significant other;Children Available Help at Discharge: Family;Available PRN/intermittently Type of Home: House Home Access: Stairs to enter Entrance Stairs-Rails: None Entrance Stairs-Number of Steps: 2   Home Layout: One level Home Equipment: Agricultural Consultant (2 wheels);BSC/3in1      Prior Function Prior Level of Function : Independent/Modified Independent;Working/employed;Driving         Extremity/Trunk Assessment   Upper Extremity Assessment Upper Extremity Assessment: Overall WFL for tasks assessed    Lower Extremity Assessment Lower Extremity Assessment: Overall WFL for tasks assessed    Cervical / Trunk Assessment Cervical / Trunk Assessment: Normal  Communication   Communication Communication: No apparent difficulties    Cognition Arousal: Alert Behavior During Therapy: WFL for tasks  assessed/performed   PT - Cognitive impairments: No apparent impairments   Following commands:  Intact       Cueing Cueing Techniques: Verbal cues     General Comments General comments (skin integrity, edema, etc.): incision clean, dry and intact. Spouse/son present during session    Exercises Total Joint Exercises Goniometric ROM: 70 degrees knee flexion ~ 10 degrees knee extension   Assessment/Plan    PT Assessment Patient needs continued PT services  PT Problem List Decreased balance;Decreased range of motion;Decreased strength;Decreased mobility;Pain       PT Treatment Interventions DME instruction;Therapeutic exercise;Gait training;Stair training;Functional mobility training;Therapeutic activities;Patient/family education    PT Goals (Current goals can be found in the Care Plan section)  Acute Rehab PT Goals Patient Stated Goal: to get back to usual activity PT Goal Formulation: With patient Time For Goal Achievement: 09/25/24 Potential to Achieve Goals: Good    Frequency 7X/week        AM-PAC PT 6 Clicks Mobility  Outcome Measure Help needed turning from your back to your side while in a flat bed without using bedrails?: None Help needed moving from lying on your back to sitting on the side of a flat bed without using bedrails?: None Help needed moving to and from a bed to a chair (including a wheelchair)?: A Little Help needed standing up from a chair using your arms (e.g., wheelchair or bedside chair)?: A Little Help needed to walk in hospital room?: A Little Help needed climbing 3-5 steps with a railing? : A Little 6 Click Score: 20    End of Session Equipment Utilized During Treatment: Gait belt Activity Tolerance: Patient tolerated treatment well;Other (comment) (limited due to nausea/vomiting) Patient left: in bed;with call bell/phone within reach;with family/visitor present Nurse Communication: Mobility status;Other (comment) (pt nauseous/vomiting) PT Visit Diagnosis: Other abnormalities of gait and mobility (R26.89)    Time: 1420-1450 PT Time  Calculation (min) (ACUTE ONLY): 30 min   Charges:   PT Evaluation $PT Eval Low Complexity: 1 Low PT Treatments $Therapeutic Activity: 8-22 mins PT General Charges $$ ACUTE PT VISIT: 1 Visit        Dorothyann Maier, DPT, CLT  Acute Rehabilitation Services Office: [phone] (Secure chat preferred)   Dorothyann VEAR Maier 09/11/2024, 2:59 PM

## 2024-09-11 NOTE — Transfer of Care (Signed)
 Immediate Anesthesia Transfer of Care Note  Patient: Cheryl Bush  Procedure(s) Performed: ARTHROPLASTY, KNEE, TOTAL (Right: Knee)  Patient Location: PACU  Anesthesia Type:MAC, Regional, and Spinal  Level of Consciousness: awake, alert , oriented, and patient cooperative  Airway & Oxygen Therapy: Patient Spontanous Breathing  Post-op Assessment: Report given to RN and Post -op Vital signs reviewed and stable  Post vital signs: Reviewed and stable  Last Vitals:  Vitals Value Taken Time  BP 94/67 09/11/24 10:23  Temp 37 C   Pulse 65   Resp 19 09/11/24 10:26  SpO2 94%   Vitals shown include unfiled device data.  Last Pain:  Vitals:   09/11/24 0714  TempSrc:   PainSc: 0-No pain      Patients Stated Pain Goal: 0 (09/11/24 0656)  Complications: No notable events documented.

## 2024-09-12 ENCOUNTER — Encounter (HOSPITAL_COMMUNITY): Payer: Self-pay | Admitting: Orthopaedic Surgery

## 2024-09-12 ENCOUNTER — Other Ambulatory Visit: Payer: Self-pay | Admitting: Physician Assistant

## 2024-09-12 ENCOUNTER — Other Ambulatory Visit (HOSPITAL_COMMUNITY): Payer: Self-pay

## 2024-09-12 DIAGNOSIS — M1711 Unilateral primary osteoarthritis, right knee: Secondary | ICD-10-CM | POA: Diagnosis not present

## 2024-09-12 MED ORDER — METHOCARBAMOL 750 MG PO TABS
750.0000 mg | ORAL_TABLET | Freq: Three times a day (TID) | ORAL | 2 refills | Status: DC | PRN
  Filled 2024-09-12: qty 30, 10d supply, fill #0

## 2024-09-12 MED ORDER — ONDANSETRON HCL 4 MG PO TABS
4.0000 mg | ORAL_TABLET | Freq: Three times a day (TID) | ORAL | 0 refills | Status: AC | PRN
  Filled 2024-09-12: qty 40, 14d supply, fill #0

## 2024-09-12 MED ORDER — OXYCODONE-ACETAMINOPHEN 5-325 MG PO TABS
1.0000 | ORAL_TABLET | Freq: Four times a day (QID) | ORAL | 0 refills | Status: AC | PRN
  Filled 2024-09-12: qty 40, 7d supply, fill #0

## 2024-09-12 MED ORDER — ASPIRIN 81 MG PO CHEW
81.0000 mg | CHEWABLE_TABLET | Freq: Two times a day (BID) | ORAL | 0 refills | Status: AC
  Filled 2024-09-12: qty 84, 42d supply, fill #0

## 2024-09-12 MED ORDER — DOCUSATE SODIUM 100 MG PO CAPS
100.0000 mg | ORAL_CAPSULE | Freq: Every day | ORAL | 2 refills | Status: AC | PRN
  Filled 2024-09-12: qty 30, 30d supply, fill #0

## 2024-09-12 NOTE — Progress Notes (Signed)
 Patient in no acute distress nor complaints of pain nor discomfort; incision on back is clean, dry and intact; No c/o pain at this time. Patient discharge home per order. Room was checked and accounted for all patient's belongings; discharge instructions concerning her medications, incision care, follow up appointment and when to call the doctor as needed were all discussed with patient and son by RN and they both expressed understanding on the instructions given

## 2024-09-12 NOTE — TOC Transition Note (Signed)
 Transition of Care [facility]) - Discharge Note   Patient Details  Name: Cheryl Bush MRN: [id number] Date of Birth: 1966-05-09  Transition of Care [facility]) CM/SW Contact:  Andrez JULIANNA George, RN Phone Number: 09/12/2024, 9:34 AM   Clinical Narrative:     Pt with orders for [facility] therapies but no HH agency offered due to her insurance. Per bedside RN the surgeon is in agreement with outpatient therapy. Outpt therapy referral sent and information on the AVS.  Pt has transportation home.   Final next level of care: OP Rehab Barriers to Discharge: No Barriers Identified   Patient Goals and CMS Choice            Discharge Placement                       Discharge Plan and Services Additional resources added to the After Visit Summary for                                       Social Drivers of Health (SDOH) Interventions SDOH Screenings   Food Insecurity: Patient Declined (12/03/2023)  Housing: Unknown (12/03/2023)  Transportation Needs: No Transportation Needs (12/03/2023)  Utilities: Not At Risk (12/03/2023)  Tobacco Use: Low Risk  (09/11/2024)     Readmission Risk Interventions     No data to display

## 2024-09-12 NOTE — Evaluation (Signed)
 Occupational Therapy Evaluation Patient Details Name: Cheryl Bush MRN: [id number] DOB: 1966/07/30 Today's Date: 09/12/2024   History of Present Illness   Pt is 58 yo presenting to [facility] on 11/17 for schedule R TKA. PMH: HLD.     Clinical Impressions PTA patient independent and working. Admitted for above and presents near baseline at supervision/setup level for Adls, functional transfers and mobility using RW.  She was educated on compensatory techniques for ADLs, safety and DME.  She will have support from spouse at dc.  Limited by post op pain in R knee.  Anticipate she will progress well.  No further OT needs identified and OT will sign off.  Thank you for this referral!      If plan is discharge home, recommend the following:   A little help with walking and/or transfers;A little help with bathing/dressing/bathroom;Assistance with cooking/housework;Assist for transportation     Functional Status Assessment   Patient has had a recent decline in their functional status and demonstrates the ability to make significant improvements in function in a reasonable and predictable amount of time.     Equipment Recommendations   None recommended by OT     Recommendations for Other Services         Precautions/Restrictions   Precautions Precautions: Fall;Knee Precaution Booklet Issued: Yes (comment) Recall of Precautions/Restrictions: Intact Restrictions Weight Bearing Restrictions Per Provider Order: No     Mobility Bed Mobility               General bed mobility comments: EOB upon entry    Transfers Overall transfer level: Needs assistance Equipment used: Rolling walker (2 wheels) Transfers: Sit to/from Stand Sit to Stand: Supervision           General transfer comment: for sfety, cueing for hand placement      Balance Overall balance assessment: Mild deficits observed, not formally tested                                          ADL either performed or assessed with clinical judgement   ADL Overall ADL's : Needs assistance/impaired     Grooming: Supervision/safety;Standing           Upper Body Dressing : Set up;Sitting   Lower Body Dressing: Supervision/safety;Set up;Sit to/from stand   Toilet Transfer: Supervision/safety;Ambulation             General ADL Comments: will have assist from spouse for shower, educated on safety and compensatory techniques for ADLs. setup to supervision for safety     Vision         Perception         Praxis         Pertinent Vitals/Pain Pain Assessment Pain Assessment: Faces Faces Pain Scale: Hurts little more Pain Location: R knee Pain Descriptors / Indicators: Aching, Discomfort Pain Intervention(s): Limited activity within patient's tolerance, Monitored during session, Repositioned     Extremity/Trunk Assessment Upper Extremity Assessment Upper Extremity Assessment: Overall WFL for tasks assessed   Lower Extremity Assessment Lower Extremity Assessment: Defer to PT evaluation (s/p R TKA)   Cervical / Trunk Assessment Cervical / Trunk Assessment: Normal   Communication Communication Communication: No apparent difficulties   Cognition Arousal: Alert Behavior During Therapy: WFL for tasks assessed/performed Cognition: No apparent impairments  Following commands: Intact       Cueing  General Comments   Cueing Techniques: Verbal cues  incision clean and dry   Exercises     Shoulder Instructions      Home Living Family/patient expects to be discharged to:: Private residence Living Arrangements: Spouse/significant other;Children Available Help at Discharge: Family;Available PRN/intermittently Type of Home: House Home Access: Stairs to enter [employer] of Steps: 2 Entrance Stairs-Rails: None Home Layout: One level     Bathroom Shower/Tub: Contractor: Standard     Home Equipment: Agricultural Consultant (2 wheels);BSC/3in1          Prior Functioning/Environment Prior Level of Function : Independent/Modified Independent;Working/employed;Driving               ADLs Comments: working    OT Problem List: Decreased activity tolerance;Pain;Decreased knowledge of use of DME or AE;Decreased knowledge of precautions   OT Treatment/Interventions:        OT Goals(Current goals can be found in the care plan section)   Acute Rehab OT Goals Patient Stated Goal: home OT Goal Formulation: With patient   OT Frequency:       Co-evaluation              AM-PAC OT 6 Clicks Daily Activity     Outcome Measure Help from another person eating meals?: None Help from another person taking care of personal grooming?: A Little Help from another person toileting, which includes using toliet, bedpan, or urinal?: A Little Help from another person bathing (including washing, rinsing, drying)?: A Little Help from another person to put on and taking off regular upper body clothing?: A Little Help from another person to put on and taking off regular lower body clothing?: A Little 6 Click Score: 19   End of Session Equipment Utilized During Treatment: Rolling walker (2 wheels) Nurse Communication: Mobility status  Activity Tolerance: Patient tolerated treatment well Patient left: with call bell/phone within reach;Other (comment) (EOB)  OT Visit Diagnosis: Other abnormalities of gait and mobility (R26.89);Pain Pain - Right/Left: Right Pain - part of body: Knee                Time: 9143-9088 OT Time Calculation (min): 15 min Charges:  OT General Charges $OT Visit: 1 Visit OT Evaluation $OT Eval Low Complexity: 1 Low  Etta NOVAK, OT Acute Rehabilitation Services Office [phone] Secure Chat Preferred    Etta GORMAN Hope 09/12/2024, 10:30 AM

## 2024-09-12 NOTE — Progress Notes (Signed)
 Subjective: 1 Day Post-Op Procedure(s) (LRB): ARTHROPLASTY, KNEE, TOTAL (Right) Patient reports pain as mild.    Objective: Vital signs in last 24 hours: Temp:  [96.2 F (35.7 C)-98.9 F (37.2 C)] 98.7 F (37.1 C) (11/18 0750) Pulse Rate:  [53-82] 76 (11/18 0750) Resp:  [11-20] 16 (11/18 0750) BP: (96-146)/(53-82) 117/66 (11/18 0750) SpO2:  [95 %-100 %] 100 % (11/18 0750)  Intake/Output from previous day: 11/17 0701 - 11/18 0700 In: 1980 [P.O.:480; I.V.:1500] Out: 1053 [Urine:1003; Blood:50] Intake/Output this shift: No intake/output data recorded.  No results for input(s): HGB in the last 72 hours. No results for input(s): WBC, RBC, HCT, PLT in the last 72 hours. No results for input(s): NA, K, CL, CO2, BUN, CREATININE, GLUCOSE, CALCIUM  in the last 72 hours. No results for input(s): LABPT, INR in the last 72 hours.  Neurologically intact Neurovascular intact Sensation intact distally Intact pulses distally Dorsiflexion/Plantar flexion intact Incision: scant drainage No cellulitis present Compartment soft   Assessment/Plan: 1 Day Post-Op Procedure(s) (LRB): ARTHROPLASTY, KNEE, TOTAL (Right) Advance diet Up with therapy D/C IV fluids Discharge home with home health once cleared by PT WBAT RLE Never picked up post-op meds so will resend them all to [facility] 09/12/2024, 8:04 AM

## 2024-09-12 NOTE — Progress Notes (Signed)
 PHYSICAL THERAPY NOTE:  PT returned to educate son on how to properly assist patient on stairs. Son demo'd return demonstration both holding walker properly and the proper sequence as a patient going up with the good leg, down with the bad leg. Pt and son appreciative of services and education.    09/12/24 1000  PT Visit Information  Last PT Received On 09/12/24  Assistance Needed +1  History of Present Illness Pt is 58 yo presenting to [facility] on 11/17 for schedule R TKA. PMH: HLD.  Ambulation/Gait  Stairs Yes  General stair comments educated son on how to assist patient safely on stairs using RW and backwards technique  PT Time Calculation  PT Start Time (ACUTE ONLY) 1011  PT Stop Time (ACUTE ONLY) 1020  PT Time Calculation (min) (ACUTE ONLY) 9 min  PT General Charges  $$ ACUTE PT VISIT 1 Visit  PT Treatments  $Gait Training 8-22 mins     Norene Ames, PT, DPT Acute Rehabilitation Services Secure chat preferred Office #: [phone]

## 2024-09-12 NOTE — Progress Notes (Signed)
 Physical Therapy Treatment Patient Details Name: Cheryl Bush MRN: [id number] DOB: September 04, 1966 Today's Date: 09/12/2024   History of Present Illness Pt is 58 yo presenting to [facility] on 11/17 for schedule R TKA. PMH: HLD.    PT Comments  Pt with no n/v this date and progressing well towards all goals. Pt with improved ambulation kinematics and completed stair negotiation with use of walker. Will educate son on how to assist pt with stair negotiation to safely enter home when he comes to pick up pt for d/c.    If plan is discharge home, recommend the following: Help with stairs or ramp for entrance;Assistance with cooking/housework;Assist for transportation   Can travel by private vehicle        Equipment Recommendations  None recommended by PT    Recommendations for Other Services       Precautions / Restrictions Precautions Precautions: Fall;Knee Precaution Booklet Issued: Yes (comment) Recall of Precautions/Restrictions: Intact Restrictions Weight Bearing Restrictions Per Provider Order: No     Mobility  Bed Mobility Overal bed mobility: Modified Independent             General bed mobility comments: increased time for supine<>sitting, HOB elevated    Transfers Overall transfer level: Needs assistance Equipment used: Rolling walker (2 wheels) Transfers: Sit to/from Stand Sit to Stand: Contact guard assist           General transfer comment: CGA for safety, verbal cues for safe hand placement    Ambulation/Gait Ambulation/Gait assistance: Contact guard assist Gait Distance (Feet): 150 Feet Assistive device: Rolling walker (2 wheels) Gait Pattern/deviations: Step-through pattern, Decreased stance time - right, Decreased step length - right, Antalgic Gait velocity: decreased Gait velocity interpretation: <1.8 ft/sec, indicate of risk for recurrent falls   General Gait Details: focused on R Knee extension during stance phase to active quad, progressed from  step to to step through by end of session   Stairs Stairs: Yes Stairs assistance: Min assist Stair Management: Step to pattern, Backwards, With walker Number of Stairs: 3 (x2 sets) General stair comments: educated on how to use RW on stairs safely due to not having handrail at home   Wheelchair Mobility     Tilt Bed    Modified Rankin (Stroke Patients Only)       Balance Overall balance assessment: Mild deficits observed, not formally tested (needs RW for safe amb at this time)                                          Communication Communication Communication: No apparent difficulties  Cognition Arousal: Alert Behavior During Therapy: WFL for tasks assessed/performed   PT - Cognitive impairments: No apparent impairments                         Following commands: Intact      Cueing Cueing Techniques: Verbal cues  Exercises Total Joint Exercises Ankle Circles/Pumps: AROM, Both, 10 reps, Supine Quad Sets: AROM, Right, 10 reps, Supine (with 5 sec hold) Heel Slides: AROM, Right, 10 reps, Seated Long Arc Quad: AROM, Right, 10 reps, Seated Goniometric ROM: 60 deg of active R knee flexion in sitting    General Comments General comments (skin integrity, edema, etc.): incision clean and dry      Pertinent Vitals/Pain Pain Assessment Pain Assessment: Faces Faces Pain Scale: Hurts little more Pain  Location: R knee, increase pain with knee flexion Pain Descriptors / Indicators: Aching, Discomfort Pain Intervention(s): Monitored during session    Home Living                          Prior Function            PT Goals (current goals can now be found in the care plan section) Acute Rehab PT Goals Patient Stated Goal: to get back to usual activity PT Goal Formulation: With patient Time For Goal Achievement: 09/25/24 Potential to Achieve Goals: Good Progress towards PT goals: Progressing toward goals    Frequency     7X/week      PT Plan      Co-evaluation              AM-PAC PT 6 Clicks Mobility   Outcome Measure  Help needed turning from your back to your side while in a flat bed without using bedrails?: None Help needed moving from lying on your back to sitting on the side of a flat bed without using bedrails?: None Help needed moving to and from a bed to a chair (including a wheelchair)?: A Little Help needed standing up from a chair using your arms (e.g., wheelchair or bedside chair)?: A Little Help needed to walk in hospital room?: A Little Help needed climbing 3-5 steps with a railing? : A Little 6 Click Score: 20    End of Session Equipment Utilized During Treatment: Gait belt Activity Tolerance: Patient tolerated treatment well Patient left: in bed;with call bell/phone within reach (sitting EOB) Nurse Communication: Mobility status;Other (comment) (pt nauseous/vomiting) PT Visit Diagnosis: Other abnormalities of gait and mobility (R26.89)     Time: 9185-9152 PT Time Calculation (min) (ACUTE ONLY): 33 min  Charges:    $Gait Training: 8-22 mins $Therapeutic Exercise: 8-22 mins PT General Charges $$ ACUTE PT VISIT: 1 Visit                     Norene Ames, PT, DPT Acute Rehabilitation Services Secure chat preferred Office #: [phone]    Norene CHRISTELLA Ames 09/12/2024, 9:26 AM

## 2024-09-12 NOTE — Discharge Summary (Signed)
 Patient ID: Cheryl Bush MRN: [id number] DOB/AGE: 1966-09-29 58 y.o.  Admit date: 09/11/2024 Discharge date: 09/12/2024  Admission Diagnoses:  Principal Problem:   Primary osteoarthritis of right knee Active Problems:   Status post total right knee replacement   Discharge Diagnoses:  Same  Past Medical History:  Diagnosis Date   Arthritis    High cholesterol    Inverted nipple left   abcess removed from areola, causing some scarring and inverted nipple    Surgeries: Procedure(s): ARTHROPLASTY, KNEE, TOTAL on 09/11/2024   Consultants:   Discharged Condition: Improved  Hospital Course: Kati Riggenbach is an 58 y.o. female who was admitted 09/11/2024 for operative treatment ofPrimary osteoarthritis of right knee. Patient has severe unremitting pain that affects sleep, daily activities, and work/hobbies. After pre-op clearance the patient was taken to the operating room on 09/11/2024 and underwent  Procedure(s): ARTHROPLASTY, KNEE, TOTAL.    Patient was given perioperative antibiotics:  Anti-infectives (From admission, onward)    Start     Dose/Rate Route Frequency Ordered Stop   09/11/24 1500  ceFAZolin  (ANCEF ) IVPB 2g/100 mL premix        2 g 200 mL/hr over 30 Minutes Intravenous Every 6 hours 09/11/24 1256 09/11/24 2036   09/11/24 0906  vancomycin  (VANCOCIN ) powder  Status:  Discontinued          As needed 09/11/24 0907 09/11/24 1020   09/11/24 0645  ceFAZolin  (ANCEF ) IVPB 2g/100 mL premix        2 g 200 mL/hr over 30 Minutes Intravenous On call to O.R. 09/11/24 0636 09/11/24 0845        Patient was given sequential compression devices, early ambulation, and chemoprophylaxis to prevent DVT.  Inpatient Morphine  Milligram Equivalents Per Day 11/17 - 11/18   Values displayed are in units of MME/Day    Order Start / End Date Yesterday Today    oxyCODONE  (Oxy IR/ROXICODONE ) immediate release tablet 5 mg 11/17 - 11/17 7.5 of Unknown --    oxyCODONE  (ROXICODONE ) 5  MG/5ML solution 5 mg 11/17 - 11/17 0 of Unknown --      Group total: 7.5 of Unknown     HYDROmorphone  (DILAUDID ) injection 0.25-0.5 mg 11/17 - 11/17 20 of 40-80 --    oxyCODONE  (Oxy IR/ROXICODONE ) immediate release tablet 5 mg 11/17 - No end date 7.5 of 15 0 of 30    oxyCODONE  (Oxy IR/ROXICODONE ) immediate release tablet 10 mg 11/17 - No end date 0 of 30 15 of 60    HYDROmorphone  (DILAUDID ) injection 1 mg 11/17 - No end date 0 of 40 0 of 60    fentaNYL  (SUBLIMAZE ) 100 MCG/2ML injection 11/17 - 11/17 0 of Unknown --    Daily Totals  35 of Unknown (at least 125-165) 15 of 150    Calculation Errors     Order Type Date Details   oxyCODONE  (Oxy IR/ROXICODONE ) immediate release tablet 5 mg Ordered Dose -- Insufficient frequency information   oxyCODONE  (ROXICODONE ) 5 MG/5ML solution 5 mg Ordered Dose -- Insufficient frequency information   fentaNYL  (SUBLIMAZE ) 100 MCG/2ML injection Ordered Dose -- Frequency type could not be determined            Patient benefited maximally from hospital stay and there were no complications.    Recent vital signs: Patient Vitals for the past 24 hrs:  BP Temp Temp src Pulse Resp SpO2  09/12/24 0750 117/66 98.7 F (37.1 C) Oral 76 16 100 %  09/12/24 0348 104/67 98.9 F (37.2 C) Oral  72 18 100 %  09/11/24 2301 (!) 96/53 98 F (36.7 C) Oral 82 18 100 %  09/11/24 1948 (!) 109/54 98.3 F (36.8 C) Oral 74 18 99 %  09/11/24 1619 123/74 98.8 F (37.1 C) -- 74 20 100 %  09/11/24 1305 (!) 146/82 97.7 F (36.5 C) Oral 60 17 99 %  09/11/24 1303 (!) 146/82 -- -- 63 -- 99 %  09/11/24 1230 121/70 97.9 F (36.6 C) -- (!) 57 11 100 %  09/11/24 1215 109/67 -- -- 62 14 95 %  09/11/24 1200 114/71 -- -- 61 12 97 %  09/11/24 1130 98/73 -- -- (!) 59 14 100 %  09/11/24 1115 105/64 -- -- (!) 53 15 99 %  09/11/24 1100 96/70 97.9 F (36.6 C) -- (!) 59 15 99 %  09/11/24 1030 -- (!) 96.2 F (35.7 C) -- -- -- 99 %     Recent laboratory studies: No results for input(s):  WBC, HGB, HCT, PLT, NA, K, CL, CO2, BUN, CREATININE, GLUCOSE, INR, CALCIUM  in the last 72 hours.  Invalid input(s): PT, 2   Discharge Medications:   Allergies as of 09/12/2024       Reactions   Porcine (pork) Protein-containing Drug Products Other (See Comments)   RELIGIOUS REASON        Medication List     STOP taking these medications    celecoxib  200 MG capsule Commonly known as: CELEBREX    ibuprofen  800 MG tablet Commonly known as: ADVIL        TAKE these medications    aspirin  81 MG chewable tablet Commonly known as: Aspirin  81 Chew 1 tablet (81 mg total) by mouth 2 (two) times daily. To be taken after surgery to prevent blood clots   atorvastatin  10 MG tablet Commonly known as: LIPITOR Take 1 tablet (10 mg total) by mouth daily.   docusate sodium  100 MG capsule Commonly known as: Colace Take 1 capsule (100 mg total) by mouth daily as needed.   Lovaza 1 g capsule Generic drug: omega-3 acid ethyl esters Take 2 g by mouth every other day.   methocarbamol  750 MG tablet Commonly known as: ROBAXIN  Take 1 tablet (750 mg total) by mouth 3 (three) times daily as needed. What changed: Another medication with the same name was removed. Continue taking this medication, and follow the directions you see here.   ondansetron  4 MG tablet Commonly known as: Zofran  Take 1 tablet (4 mg total) by mouth every 8 (eight) hours as needed for nausea or vomiting. What changed: Another medication with the same name was removed. Continue taking this medication, and follow the directions you see here.   oxyCODONE -acetaminophen  5-325 MG tablet Commonly known as: Percocet Take 1-2 tablets by mouth every 6 (six) hours as needed. To be taken after surgery What changed: Another medication with the same name was changed. Make sure you understand how and when to take each.   oxyCODONE -acetaminophen  5-325 MG tablet Commonly known as: Percocet Take 1-2  tablets by mouth every 6 (six) hours as needed. To be taken after surgery What changed: when to take this               Durable Medical Equipment  (From admission, onward)           Start     Ordered   09/11/24 1257  DME Walker rolling  Once       Question Answer Comment  Walker: With 5 Inch Wheels   Patient  needs a walker to treat with the following condition Status post left partial knee replacement      09/11/24 1256   09/11/24 1257  DME 3 n 1  Once        09/11/24 1256   09/11/24 1257  DME Bedside commode  Once       Question:  Patient needs a bedside commode to treat with the following condition  Answer:  Status post left partial knee replacement   09/11/24 1256            Diagnostic Studies: DG Knee Right Port Result Date: 09/11/2024 CLINICAL DATA:  Pain, postop. EXAM: PORTABLE RIGHT KNEE - 1-2 VIEW COMPARISON:  06/20/2024 FINDINGS: Right knee arthroplasty in expected alignment. No periprosthetic lucency or fracture. Recent postsurgical change includes air and edema in the soft tissues and joint space. IMPRESSION: Right knee arthroplasty without immediate postoperative complication. Electronically Signed   By: Andrea Gasman M.D.   On: 09/11/2024 12:41   MM 3D DIAGNOSTIC MAMMOGRAM UNILATERAL LEFT BREAST Result Date: 09/09/2024 CLINICAL DATA:  58 year old woman recalled for possible LEFT breast masses. EXAM: DIGITAL DIAGNOSTIC UNILATERAL LEFT MAMMOGRAM WITH TOMOSYNTHESIS AND CAD; ULTRASOUND LEFT BREAST LIMITED TECHNIQUE: Left digital diagnostic mammography and breast tomosynthesis was performed. The images were evaluated with computer-aided detection. ; Targeted ultrasound examination of the left breast was performed. COMPARISON:  Previous exam(s). ACR Breast Density Category c: The breasts are heterogeneously dense, which may obscure small masses. FINDINGS: LEFT: Mammogram: Spot compression CC and MLO views of the LEFT breast were obtained. Two oval rounded masses  are seen in the posterior depth of the upper LEFT breast. Ultrasound: Targeted sonographic evaluation of the LEFT breast was performed. Two adjacent oval circumscribed anechoic masses seen at 12 o'clock 8 CMFN measuring 1.0 x 0.4 x 0.7 cm and 0.6 x 0.2 x 0.4 cm, consistent with benign simple cysts. These correspond to the abnormality seen on mammogram. Multiple dilated ducts are seen in the retroareolar LEFT breast with no intraluminal filling defects. Nonenlarged lymph nodes seen in the LEFT axilla. IMPRESSION: LEFT breast masses consistent with benign simple cysts. No evidence of LEFT breast malignancy. RECOMMENDATION: Screening mammogram in one year.(Code:SM-B-01Y) I have discussed the findings and recommendations with the patient. If applicable, a reminder letter will be sent to the patient regarding the next appointment. BI-RADS CATEGORY  2: Benign. Electronically Signed   By: Aliene Lloyd M.D.   On: 09/09/2024 11:56   US  LIMITED ULTRASOUND INCLUDING AXILLA LEFT BREAST  Result Date: 09/09/2024 CLINICAL DATA:  58 year old woman recalled for possible LEFT breast masses. EXAM: DIGITAL DIAGNOSTIC UNILATERAL LEFT MAMMOGRAM WITH TOMOSYNTHESIS AND CAD; ULTRASOUND LEFT BREAST LIMITED TECHNIQUE: Left digital diagnostic mammography and breast tomosynthesis was performed. The images were evaluated with computer-aided detection. ; Targeted ultrasound examination of the left breast was performed. COMPARISON:  Previous exam(s). ACR Breast Density Category c: The breasts are heterogeneously dense, which may obscure small masses. FINDINGS: LEFT: Mammogram: Spot compression CC and MLO views of the LEFT breast were obtained. Two oval rounded masses are seen in the posterior depth of the upper LEFT breast. Ultrasound: Targeted sonographic evaluation of the LEFT breast was performed. Two adjacent oval circumscribed anechoic masses seen at 12 o'clock 8 CMFN measuring 1.0 x 0.4 x 0.7 cm and 0.6 x 0.2 x 0.4 cm, consistent with  benign simple cysts. These correspond to the abnormality seen on mammogram. Multiple dilated ducts are seen in the retroareolar LEFT breast with no intraluminal filling defects. Nonenlarged lymph nodes  seen in the LEFT axilla. IMPRESSION: LEFT breast masses consistent with benign simple cysts. No evidence of LEFT breast malignancy. RECOMMENDATION: Screening mammogram in one year.(Code:SM-B-01Y) I have discussed the findings and recommendations with the patient. If applicable, a reminder letter will be sent to the patient regarding the next appointment. BI-RADS CATEGORY  2: Benign. Electronically Signed   By: Aliene Lloyd M.D.   On: 09/09/2024 11:56   MM 3D SCREENING MAMMOGRAM BILATERAL BREAST Result Date: 09/04/2024 CLINICAL DATA:  Screening. EXAM: DIGITAL SCREENING BILATERAL MAMMOGRAM WITH TOMOSYNTHESIS AND CAD TECHNIQUE: Bilateral screening digital craniocaudal and mediolateral oblique mammograms were obtained. Bilateral screening digital breast tomosynthesis was performed. The images were evaluated with computer-aided detection. COMPARISON:  Previous exam(s). ACR Breast Density Category c: The breasts are heterogeneously dense, which may obscure small masses. FINDINGS: In the left breast, possible masses warrants further evaluation. In the right breast, no findings suspicious for malignancy. IMPRESSION: Further evaluation is suggested for possible masses in the left breast. RECOMMENDATION: Diagnostic mammogram and possibly ultrasound of the left breast. (Code:[id number]-32M) The patient will be contacted regarding the findings, and additional imaging will be scheduled. BI-RADS CATEGORY  0: Incomplete: Need additional imaging evaluation. Electronically Signed   By: Dina  Arceo M.D.   On: 09/04/2024 13:10    Disposition: Discharge disposition: 01-Home or Self Care          Follow-up Information     Jule Ronal CROME, PA-C. Schedule an appointment as soon as possible for a visit in 2 week(s).    Specialty: Orthopedic Surgery Contact information: [address] [place]  [place] [address] [phone]                  Signed: Ronal CROME Jule 09/12/2024, 8:18 AM

## 2024-09-14 ENCOUNTER — Ambulatory Visit: Payer: PRIVATE HEALTH INSURANCE | Attending: Orthopaedic Surgery

## 2024-09-14 DIAGNOSIS — M25561 Pain in right knee: Secondary | ICD-10-CM | POA: Insufficient documentation

## 2024-09-14 DIAGNOSIS — R600 Localized edema: Secondary | ICD-10-CM | POA: Insufficient documentation

## 2024-09-14 DIAGNOSIS — M1711 Unilateral primary osteoarthritis, right knee: Secondary | ICD-10-CM | POA: Insufficient documentation

## 2024-09-14 DIAGNOSIS — M6281 Muscle weakness (generalized): Secondary | ICD-10-CM | POA: Insufficient documentation

## 2024-09-14 DIAGNOSIS — R2689 Other abnormalities of gait and mobility: Secondary | ICD-10-CM | POA: Diagnosis present

## 2024-09-14 NOTE — Therapy (Signed)
 OUTPATIENT PHYSICAL THERAPY LOWER EXTREMITY EVALUATION   Patient Name: Cheryl Bush MRN: [id number] DOB:1966/08/26, 58 y.o., female Today's Date: 09/15/2024  END OF SESSION:  PT End of Session - 09/15/24 1137     Visit Number 1    Number of Visits 24    Date for Recertification  12/08/24    Authorization Type [employer]    PT Start Time 1401    PT Stop Time 1442    PT Time Calculation (min) 41 min    Activity Tolerance Patient limited by pain    Behavior During Therapy WFL for tasks assessed/performed          Past Medical History:  Diagnosis Date   Arthritis    High cholesterol    Inverted nipple left   abcess removed from areola, causing some scarring and inverted nipple   Past Surgical History:  Procedure Laterality Date   KNEE SURGERY     left- arthroscopy   TOTAL KNEE ARTHROPLASTY Left 04/23/2023   Procedure: LEFT TOTAL KNEE ARTHROPLASTY;  Surgeon: Jerri Kay HERO, MD;  Location: [facility] OR;  Service: Orthopedics;  Laterality: Left;   TOTAL KNEE ARTHROPLASTY Right 09/11/2024   Procedure: ARTHROPLASTY, KNEE, TOTAL;  Surgeon: Jerri Kay HERO, MD;  Location: [facility] OR;  Service: Orthopedics;  Laterality: Right;   Patient Active Problem List   Diagnosis Date Noted   Status post total right knee replacement 09/11/2024   Status post total left knee replacement 04/23/2023   Primary osteoarthritis of right knee 07/28/2022   Leukopenia 09/07/2016   Mastitis-left, medial aspect 03/15/2013    PCP: Rosalea Rosina SAILOR, PA   REFERRING PROVIDER: Jerri Kay HERO, MD  REFERRING DIAG: M17.11 (ICD-10-CM) - Primary osteoarthritis of right knee   THERAPY DIAG:  Acute pain of right knee  Muscle weakness (generalized)  Other abnormalities of gait and mobility  Localized edema  Rationale for Evaluation and Treatment: Rehabilitation  ONSET DATE: 09/11/2024  SUBJECTIVE:   SUBJECTIVE STATEMENT: Pt presents to PT s/p R TKA performed by Dr. Jerri on 09/11/2024. Notes that she has been  very painful, having difficulty getting around house and has been fairly sedentary. Had L knee replaced in 2024 and did fairly well with this. Wants to improve her knee ROM and pain in order to more comfortably pray.   PERTINENT HISTORY: L TKA, R TKA  PAIN:  Are you having pain?  Yes: NPRS scale: 10/10 Worst: 10/10 Pain location: R knee Pain description: sharp, sore,  Aggravating factors: standing, walking, bending Relieving factors: rest, medication  PRECAUTIONS: None  RED FLAGS: None   WEIGHT BEARING RESTRICTIONS: Yes - WBAT  FALLS:  Has patient fallen in last 6 months? No  LIVING ENVIRONMENT: Lives with: lives with their family Lives in: House/apartment Stairs: Yes: External: 2 steps; on right going up Has following equipment at home: Single point cane and Walker - 2 wheeled  OCCUPATION: [employer]  PLOF: Independent  PATIENT GOALS: improve R knee ROM and decrease pain to make prayer less painful   NEXT MD VISIT: 09/26/2024  OBJECTIVE:  Note: Objective measures were completed at Evaluation unless otherwise noted.  DIAGNOSTIC FINDINGS: See imaging   PATIENT SURVEYS:  LEFS: 5/80 - 09/14/2024    COGNITION: Overall cognitive status: Within functional limits for tasks assessed     SENSATION: WFL  POSTURE: rounded shoulders and forward head  PALPATION: TTP to distal R quad  LOWER EXTREMITY ROM:  Active ROM Right eval Left eval  Knee flexion 15  Knee extension 55    (Blank rows = not tested)  LOWER EXTREMITY MMT:  MMT Right eval Left eval  Hip flexion    Hip extension    Hip abduction    Hip adduction    Hip internal rotation    Hip external rotation    Knee flexion DNT   Knee extension DNT   Ankle dorsiflexion    Ankle plantarflexion    Ankle inversion    Ankle eversion     (Blank rows = not tested)  LOWER EXTREMITY SPECIAL TESTS:  DNT  FUNCTIONAL TESTS:  30 Second Sit to Stand: 1 rep with UE  GAIT: Distance walked:  27ft Assistive device utilized: Environmental Consultant - 2 wheeled Level of assistance: SBA Comments: step to, decrease knee ext, antalgic gait R  TREATMENT: [facility] Adult PT Treatment:                                                DATE: 09/14/2024 Therapeutic Exercise: Ankle pumps x 20 Supine QS x 5 - 5 hold R Supine heel slide with strap x 5 - 5 hold R Seated heel slide x 5 R Seated hamstring stretch x 30 R  PATIENT EDUCATION:  Education details: eval findings, LEFS, HEP, POC Person educated: Patient Education method: Explanation, Demonstration, and Handouts Education comprehension: verbalized understanding and returned demonstration  HOME EXERCISE PROGRAM: Access Code: [email or web address] URL: [email or web address] Date: 09/14/2024 Prepared by: Alm Kingdom  Exercises - Supine Ankle Pumps  - 8 x daily - 7 x weekly - 30 reps - Supine Quad Set  - 5 x daily - 7 x weekly - 2 sets - 10 reps - 5 sec hold - Supine Heel Slide with Strap  - 5 x daily - 7 x weekly - 2 sets - 10 reps - 5 sec hold - Seated Heel Slide  - 5 x daily - 7 x weekly - 2 sets - 10 reps - 5 sec hold - Seated Hamstring Stretch  - 5 x daily - 7 x weekly - 2-3 reps - 30 sec hold  ASSESSMENT:  CLINICAL IMPRESSION: Patient is a 58 y.o. F who was seen today for physical therapy evaluation and treatment s/p R TKA performed by Dr. Jerri on 09/11/2024. Physical findings are consistent with surgery and recovery timeline as pt demonstrates significant R knee ROM and strength deficits. Sharp decline in gait and functional mobility, LEFS score shows severe disability in current performance of home ADLs and community activities. Pt would benefit from skilled PT services working on improving R knee ROM and strength post operatively.   OBJECTIVE IMPAIRMENTS: Abnormal gait, decreased activity tolerance, decreased balance, decreased endurance, decreased mobility, difficulty walking, decreased ROM, decreased strength, and pain.   ACTIVITY  LIMITATIONS: lifting, sitting, standing, squatting, stairs, transfers, and locomotion level  PARTICIPATION LIMITATIONS: meal prep, cleaning, driving, shopping, community activity, occupation, and yard work  PERSONAL FACTORS: Time since onset of injury/illness/exacerbation and 1-2 comorbidities: LTKA are also affecting patient's functional outcome.   REHAB POTENTIAL: Good  CLINICAL DECISION MAKING: Evolving/moderate complexity  EVALUATION COMPLEXITY: Moderate   GOALS: Goals reviewed with patient? No  SHORT TERM GOALS: Target date: 10/05/2024   Pt will be compliant and knowledgeable with initial HEP for improved comfort and carryover Baseline: initial HEP given  Goal status: INITIAL  2.  Pt will self report right knee pain no  greater than 7/10 for improved comfort and functional ability Baseline: 10/10 at worst Goal status: INITIAL   LONG TERM GOALS: Target date: 12/08/2024   Pt will improve LEFS to no less than 30/80 as proxy for functional improvement with home ADLs and higher level community activity Baseline: 5/80 Goal status: INITIAL   2.  Pt will self report right knee pain no greater than 4/10 for improved comfort and functional ability Baseline: 10/10 at worst Goal status: INITIAL   3.  Pt will increase 30 Second Sit to Stand rep count to no less than 8 reps for improved balance, strength, and functional mobility Baseline: 1 reps  Goal status: INITIAL   4.  Pt will improve R knee ROM to no less than 3-110 degrees for improved comfort and functional mobility Baseline: see chart Goal status: INITIAL  5.  Pt will be able to kneel and pray without increase in pain and return to sitting independently for improved comfort and function Baseline: unable Goal status: INITIAL    PLAN:  PT FREQUENCY: 2x/week  PT DURATION: 12 weeks  PLANNED INTERVENTIONS: [id number]- PT Re-evaluation, 97110-Therapeutic exercises, 97530- Therapeutic activity, [id number]- Neuromuscular  re-education, 97535- Self Care, [id number]- Manual therapy, [id number]- Gait training, [phone]- Electrical stimulation (unattended), [id number]- Electrical stimulation (manual), 97016- Vasopneumatic device, and Patient/Family education  PLAN FOR NEXT SESSION: assess HEP response, knee ROM, quad strength, gait   Alm JAYSON Kingdom, PT 09/15/2024, 11:37 AM

## 2024-09-18 ENCOUNTER — Ambulatory Visit: Payer: PRIVATE HEALTH INSURANCE

## 2024-09-18 DIAGNOSIS — R2689 Other abnormalities of gait and mobility: Secondary | ICD-10-CM

## 2024-09-18 DIAGNOSIS — M6281 Muscle weakness (generalized): Secondary | ICD-10-CM

## 2024-09-18 DIAGNOSIS — M25561 Pain in right knee: Secondary | ICD-10-CM | POA: Diagnosis not present

## 2024-09-18 DIAGNOSIS — R600 Localized edema: Secondary | ICD-10-CM

## 2024-09-18 NOTE — Therapy (Signed)
 OUTPATIENT PHYSICAL THERAPY TREATMENT   Patient Name: Cheryl Bush MRN: [id number] DOB:[birth year], 58 y.o., female Today's Date: 09/19/2024  END OF SESSION:  PT End of Session - 09/18/24 1612     Visit Number 2    Number of Visits 24    Date for Recertification  12/08/24    Authorization Type [employer]    PT Start Time 1613    PT Stop Time 1653    PT Time Calculation (min) 40 min    Activity Tolerance Patient limited by pain    Behavior During Therapy WFL for tasks assessed/performed           Past Medical History:  Diagnosis Date   Arthritis    High cholesterol    Inverted nipple left   abcess removed from areola, causing some scarring and inverted nipple   Past Surgical History:  Procedure Laterality Date   KNEE SURGERY     left- arthroscopy   TOTAL KNEE ARTHROPLASTY Left 04/23/2023   Procedure: LEFT TOTAL KNEE ARTHROPLASTY;  Surgeon: Jerri Kay HERO, MD;  Location: [facility] OR;  Service: Orthopedics;  Laterality: Left;   TOTAL KNEE ARTHROPLASTY Right 09/11/2024   Procedure: ARTHROPLASTY, KNEE, TOTAL;  Surgeon: Jerri Kay HERO, MD;  Location: [facility] OR;  Service: Orthopedics;  Laterality: Right;   Patient Active Problem List   Diagnosis Date Noted   Status post total right knee replacement 09/11/2024   Status post total left knee replacement 04/23/2023   Primary osteoarthritis of right knee 07/28/2022   Leukopenia 09/07/2016   Mastitis-left, medial aspect 03/15/2013    PCP: Rosalea Rosina SAILOR, [place]   REFERRING PROVIDER: Jerri Kay HERO, MD  REFERRING DIAG: M17.11 (ICD-10-CM) - Primary osteoarthritis of right knee   THERAPY DIAG:  Acute pain of right knee  Muscle weakness (generalized)  Other abnormalities of gait and mobility  Localized edema  Rationale for Evaluation and Treatment: Rehabilitation  ONSET DATE: 09/11/2024  SUBJECTIVE:   SUBJECTIVE STATEMENT: Pt presents to PT with continued severe pain and stiffness. Has been compliant with stretching but notes  difficulty due to pain.   EVAL: Pt presents to PT s/p R TKA performed by Dr. Jerri on 09/11/2024. Notes that she has been very painful, having difficulty getting around house and has been fairly sedentary. Had L knee replaced in 2024 and did fairly well with this. Wants to improve her knee ROM and pain in order to more comfortably pray.   PERTINENT HISTORY: L TKA, R TKA  PAIN:  Are you having pain?  Yes: NPRS scale: 10/10 Worst: 10/10 Pain location: R knee Pain description: sharp, sore,  Aggravating factors: standing, walking, bending Relieving factors: rest, medication  PRECAUTIONS: None  RED FLAGS: None   WEIGHT BEARING RESTRICTIONS: Yes - WBAT  FALLS:  Has patient fallen in last 6 months? No  LIVING ENVIRONMENT: Lives with: lives with their family Lives in: House/apartment Stairs: Yes: External: 2 steps; on right going up Has following equipment at home: Single point cane and Walker - 2 wheeled  OCCUPATION: [employer]  PLOF: Independent  PATIENT GOALS: improve R knee ROM and decrease pain to make prayer less painful   NEXT MD VISIT: 09/26/2024  OBJECTIVE:  Note: Objective measures were completed at Evaluation unless otherwise noted.  DIAGNOSTIC FINDINGS: See imaging   PATIENT SURVEYS:  LEFS: 5/80 - 09/14/2024    COGNITION: Overall cognitive status: Within functional limits for tasks assessed     SENSATION: WFL  POSTURE: rounded shoulders and forward head  PALPATION: TTP to distal R quad  LOWER EXTREMITY ROM:  Active ROM Right eval Left eval  Knee flexion 15   Knee extension 55    (Blank rows = not tested)  LOWER EXTREMITY MMT:  MMT Right eval Left eval  Hip flexion    Hip extension    Hip abduction    Hip adduction    Hip internal rotation    Hip external rotation    Knee flexion DNT   Knee extension DNT   Ankle dorsiflexion    Ankle plantarflexion    Ankle inversion    Ankle eversion     (Blank rows = not tested)  LOWER  EXTREMITY SPECIAL TESTS:  DNT  FUNCTIONAL TESTS:  30 Second Sit to Stand: 1 rep with UE  GAIT: Distance walked: 52ft Assistive device utilized: Environmental Consultant - 2 wheeled Level of assistance: SBA Comments: step to, decrease knee ext, antalgic gait R  TREATMENT: [facility] Adult PT Treatment:                                                DATE: 09/14/2024 NuStep lvl 5 UE/LE x 5 min for ROM and activity tolerance Supine QS 2x10 - 5 hold R Supine heel slide with strap 2x10 - 5 hold R (PT Assist) Seated hamstring stretch 2x30 R Seated heel slide 2x10 - 5 hold Modalities:  GameReady:  Right knee 10 minutes  36 degrees Medium compression  PATIENT EDUCATION:  Education details: eval findings, LEFS, HEP, POC Person educated: Patient Education method: Explanation, Demonstration, and Handouts Education comprehension: verbalized understanding and returned demonstration  HOME EXERCISE PROGRAM: Access Code: [email or web address] URL: [email or web address] Date: 09/14/2024 Prepared by: Alm Kingdom  Exercises - Supine Ankle Pumps  - 8 x daily - 7 x weekly - 30 reps - Supine Quad Set  - 5 x daily - 7 x weekly - 2 sets - 10 reps - 5 sec hold - Supine Heel Slide with Strap  - 5 x daily - 7 x weekly - 2 sets - 10 reps - 5 sec hold - Seated Heel Slide  - 5 x daily - 7 x weekly - 2 sets - 10 reps - 5 sec hold - Seated Hamstring Stretch  - 5 x daily - 7 x weekly - 2-3 reps - 30 sec hold  ASSESSMENT:  CLINICAL IMPRESSION: Pt was able to complete prescribed exercises but was limited by continued pain post operatively. We were able to progress her ROM well today, shows more pain into extension. Responded well to modalities noting decrease pain post session. Continues to require skilled PT post op, will continue per POC.   EVAL: Patient is a 58 y.o. F who was seen today for physical therapy evaluation and treatment s/p R TKA performed by Dr. Jerri on 09/11/2024. Physical findings are consistent with  surgery and recovery timeline as pt demonstrates significant R knee ROM and strength deficits. Sharp decline in gait and functional mobility, LEFS score shows severe disability in current performance of home ADLs and community activities. Pt would benefit from skilled PT services working on improving R knee ROM and strength post operatively.   OBJECTIVE IMPAIRMENTS: Abnormal gait, decreased activity tolerance, decreased balance, decreased endurance, decreased mobility, difficulty walking, decreased ROM, decreased strength, and pain.   ACTIVITY LIMITATIONS: lifting, sitting, standing, squatting, stairs, transfers, and locomotion level  PARTICIPATION LIMITATIONS:  meal prep, cleaning, driving, shopping, community activity, occupation, and yard work  PERSONAL FACTORS: Time since onset of injury/illness/exacerbation and 1-2 comorbidities: LTKA are also affecting patient's functional outcome.   REHAB POTENTIAL: Good  CLINICAL DECISION MAKING: Evolving/moderate complexity  EVALUATION COMPLEXITY: Moderate   GOALS: Goals reviewed with patient? No  SHORT TERM GOALS: Target date: 10/05/2024   Pt will be compliant and knowledgeable with initial HEP for improved comfort and carryover Baseline: initial HEP given  Goal status: INITIAL  2.  Pt will self report right knee pain no greater than 7/10 for improved comfort and functional ability Baseline: 10/10 at worst Goal status: INITIAL   LONG TERM GOALS: Target date: 12/08/2024   Pt will improve LEFS to no less than 30/80 as proxy for functional improvement with home ADLs and higher level community activity Baseline: 5/80 Goal status: INITIAL   2.  Pt will self report right knee pain no greater than 4/10 for improved comfort and functional ability Baseline: 10/10 at worst Goal status: INITIAL   3.  Pt will increase 30 Second Sit to Stand rep count to no less than 8 reps for improved balance, strength, and functional mobility Baseline: 1 reps   Goal status: INITIAL   4.  Pt will improve R knee ROM to no less than 3-110 degrees for improved comfort and functional mobility Baseline: see chart Goal status: INITIAL  5.  Pt will be able to kneel and pray without increase in pain and return to sitting independently for improved comfort and function Baseline: unable Goal status: INITIAL    PLAN:  PT FREQUENCY: 2x/week  PT DURATION: 12 weeks  PLANNED INTERVENTIONS: 97164- PT Re-evaluation, 97110-Therapeutic exercises, 97530- Therapeutic activity, [id number]- Neuromuscular re-education, 97535- Self Care, 02859- Manual therapy, [id number]- Gait training, [phone]- Electrical stimulation (unattended), [id number]- Electrical stimulation (manual), 97016- Vasopneumatic device, and Patient/Family education  PLAN FOR NEXT SESSION: assess HEP response, knee ROM, quad strength, gait   Alm JAYSON Kingdom, PT 09/19/2024, 8:56 AM

## 2024-09-20 ENCOUNTER — Telehealth: Payer: Self-pay | Admitting: Orthopedic Surgery

## 2024-09-20 ENCOUNTER — Other Ambulatory Visit (HOSPITAL_BASED_OUTPATIENT_CLINIC_OR_DEPARTMENT_OTHER): Payer: Self-pay | Admitting: Orthopaedic Surgery

## 2024-09-20 ENCOUNTER — Ambulatory Visit: Payer: PRIVATE HEALTH INSURANCE

## 2024-09-20 DIAGNOSIS — R2689 Other abnormalities of gait and mobility: Secondary | ICD-10-CM

## 2024-09-20 DIAGNOSIS — M25561 Pain in right knee: Secondary | ICD-10-CM | POA: Diagnosis not present

## 2024-09-20 DIAGNOSIS — M6281 Muscle weakness (generalized): Secondary | ICD-10-CM

## 2024-09-20 MED ORDER — OXYCODONE HCL 5 MG PO TABS
5.0000 mg | ORAL_TABLET | ORAL | 0 refills | Status: AC | PRN
Start: 2024-09-20 — End: ?

## 2024-09-20 NOTE — Telephone Encounter (Signed)
 Pt called for refill of pain medication oxycodone . Please send to [facility] [place]. Pt phone number is [phone].

## 2024-09-20 NOTE — Therapy (Signed)
 OUTPATIENT PHYSICAL THERAPY TREATMENT   Patient Name: Cheryl Bush MRN: [id number] DOB:Aug 20, 1966, 58 y.o., female Today's Date: 09/20/2024  END OF SESSION:  PT End of Session - 09/20/24 1446     Visit Number 3    Number of Visits 24    Date for Recertification  12/08/24    Authorization Type [employer]    PT Start Time 1445    PT Stop Time 1525    PT Time Calculation (min) 40 min    Activity Tolerance Patient limited by pain    Behavior During Therapy WFL for tasks assessed/performed            Past Medical History:  Diagnosis Date   Arthritis    High cholesterol    Inverted nipple left   abcess removed from areola, causing some scarring and inverted nipple   Past Surgical History:  Procedure Laterality Date   KNEE SURGERY     left- arthroscopy   TOTAL KNEE ARTHROPLASTY Left 04/23/2023   Procedure: LEFT TOTAL KNEE ARTHROPLASTY;  Surgeon: Jerri Kay HERO, MD;  Location: [facility] OR;  Service: Orthopedics;  Laterality: Left;   TOTAL KNEE ARTHROPLASTY Right 09/11/2024   Procedure: ARTHROPLASTY, KNEE, TOTAL;  Surgeon: Jerri Kay HERO, MD;  Location: [facility] OR;  Service: Orthopedics;  Laterality: Right;   Patient Active Problem List   Diagnosis Date Noted   Status post total right knee replacement 09/11/2024   Status post total left knee replacement 04/23/2023   Primary osteoarthritis of right knee 07/28/2022   Leukopenia 09/07/2016   Mastitis-left, medial aspect 03/15/2013    PCP: Rosalea Rosina SAILOR, PA   REFERRING PROVIDER: Jerri Kay HERO, MD  REFERRING DIAG: M17.11 (ICD-10-CM) - Primary osteoarthritis of right knee   THERAPY DIAG:  Acute pain of right knee  Muscle weakness (generalized)  Other abnormalities of gait and mobility  Rationale for Evaluation and Treatment: Rehabilitation  ONSET DATE: 09/11/2024  SUBJECTIVE:   SUBJECTIVE STATEMENT:  Arrives with 10/10 pain, unable to perform HEP as frequent as prescribed due to pain.  EVAL: Pt presents to PT s/p R TKA  performed by Dr. Jerri on 09/11/2024. Notes that she has been very painful, having difficulty getting around house and has been fairly sedentary. Had L knee replaced in 2024 and did fairly well with this. Wants to improve her knee ROM and pain in order to more comfortably pray.   PERTINENT HISTORY: L TKA, R TKA  PAIN:  Are you having pain?  Yes: NPRS scale: 10/10 Worst: 10/10 Pain location: R knee Pain description: sharp, sore,  Aggravating factors: standing, walking, bending Relieving factors: rest, medication  PRECAUTIONS: None  RED FLAGS: None   WEIGHT BEARING RESTRICTIONS: Yes - WBAT  FALLS:  Has patient fallen in last 6 months? No  LIVING ENVIRONMENT: Lives with: lives with their family Lives in: House/apartment Stairs: Yes: External: 2 steps; on right going up Has following equipment at home: Single point cane and Walker - 2 wheeled  OCCUPATION: [employer]  PLOF: Independent  PATIENT GOALS: improve R knee ROM and decrease pain to make prayer less painful   NEXT MD VISIT: 09/26/2024  OBJECTIVE:  Note: Objective measures were completed at Evaluation unless otherwise noted.  DIAGNOSTIC FINDINGS: See imaging   PATIENT SURVEYS:  LEFS: 5/80 - 09/14/2024    COGNITION: Overall cognitive status: Within functional limits for tasks assessed     SENSATION: WFL  POSTURE: rounded shoulders and forward head  PALPATION: TTP to distal R quad  LOWER  EXTREMITY ROM:  Active ROM Right eval Left eval  Knee flexion 15   Knee extension 55    (Blank rows = not tested)  LOWER EXTREMITY MMT:  MMT Right eval Left eval  Hip flexion    Hip extension    Hip abduction    Hip adduction    Hip internal rotation    Hip external rotation    Knee flexion DNT   Knee extension DNT   Ankle dorsiflexion    Ankle plantarflexion    Ankle inversion    Ankle eversion     (Blank rows = not tested)  LOWER EXTREMITY SPECIAL TESTS:  DNT  FUNCTIONAL TESTS:  30 Second  Sit to Stand: 1 rep with UE  GAIT: Distance walked: 59ft Assistive device utilized: Environmental Consultant - 2 wheeled Level of assistance: SBA Comments: step to, decrease knee ext, antalgic gait R  TREATMENT: [facility] Adult PT Treatment:                                                DATE: 09/20/24 Therapeutic Exercise: Nustep L2 8 min for ROM Seated hamstring stretch R 30s x2 FAQs with ball 15x Seated heel slides over towel 15x Heel slides with strap 15x 75d  Modalities:  GameReady:  Right knee 10 minutes  36 degrees Medium compression   [facility] Adult PT Treatment:                                                DATE: 09/14/2024 NuStep lvl 5 UE/LE x 5 min for ROM and activity tolerance Supine QS 2x10 - 5 hold R Supine heel slide with strap 2x10 - 5 hold R (PT Assist) Seated hamstring stretch 2x30 R Seated heel slide 2x10 - 5 hold SAQs 15x Modalities:  GameReady:  Right knee 10 minutes  36 degrees Medium compression  PATIENT EDUCATION:  Education details: eval findings, LEFS, HEP, POC Person educated: Patient Education method: Explanation, Demonstration, and Handouts Education comprehension: verbalized understanding and returned demonstration  HOME EXERCISE PROGRAM: Access Code: [email or web address] URL: [email or web address] Date: 09/14/2024 Prepared by: Alm Kingdom  Exercises - Supine Ankle Pumps  - 8 x daily - 7 x weekly - 30 reps - Supine Quad Set  - 5 x daily - 7 x weekly - 2 sets - 10 reps - 5 sec hold - Supine Heel Slide with Strap  - 5 x daily - 7 x weekly - 2 sets - 10 reps - 5 sec hold - Seated Heel Slide  - 5 x daily - 7 x weekly - 2 sets - 10 reps - 5 sec hold - Seated Hamstring Stretch  - 5 x daily - 7 x weekly - 2-3 reps - 30 sec hold  ASSESSMENT:  CLINICAL IMPRESSION:  Progress limited by high pain levels.  Despite 10/10 pain rating, patient able to flex knee to 75d with heel slides and participate in all requested tasks.   EVAL: Patient is a 58 y.o. F who  was seen today for physical therapy evaluation and treatment s/p R TKA performed by Dr. Jerri on 09/11/2024. Physical findings are consistent with surgery and recovery timeline as pt demonstrates significant R knee ROM and strength deficits. Sharp decline in gait and  functional mobility, LEFS score shows severe disability in current performance of home ADLs and community activities. Pt would benefit from skilled PT services working on improving R knee ROM and strength post operatively.   OBJECTIVE IMPAIRMENTS: Abnormal gait, decreased activity tolerance, decreased balance, decreased endurance, decreased mobility, difficulty walking, decreased ROM, decreased strength, and pain.   ACTIVITY LIMITATIONS: lifting, sitting, standing, squatting, stairs, transfers, and locomotion level  PARTICIPATION LIMITATIONS: meal prep, cleaning, driving, shopping, community activity, occupation, and yard work  PERSONAL FACTORS: Time since onset of injury/illness/exacerbation and 1-2 comorbidities: LTKA are also affecting patient's functional outcome.   REHAB POTENTIAL: Good  CLINICAL DECISION MAKING: Evolving/moderate complexity  EVALUATION COMPLEXITY: Moderate   GOALS: Goals reviewed with patient? No  SHORT TERM GOALS: Target date: 10/05/2024   Pt will be compliant and knowledgeable with initial HEP for improved comfort and carryover Baseline: initial HEP given  Goal status: INITIAL  2.  Pt will self report right knee pain no greater than 7/10 for improved comfort and functional ability Baseline: 10/10 at worst Goal status: INITIAL   LONG TERM GOALS: Target date: 12/08/2024   Pt will improve LEFS to no less than 30/80 as proxy for functional improvement with home ADLs and higher level community activity Baseline: 5/80 Goal status: INITIAL   2.  Pt will self report right knee pain no greater than 4/10 for improved comfort and functional ability Baseline: 10/10 at worst Goal status: INITIAL   3.  Pt  will increase 30 Second Sit to Stand rep count to no less than 8 reps for improved balance, strength, and functional mobility Baseline: 1 reps  Goal status: INITIAL   4.  Pt will improve R knee ROM to no less than 3-110 degrees for improved comfort and functional mobility Baseline: see chart Goal status: INITIAL  5.  Pt will be able to kneel and pray without increase in pain and return to sitting independently for improved comfort and function Baseline: unable Goal status: INITIAL    PLAN:  PT FREQUENCY: 2x/week  PT DURATION: 12 weeks  PLANNED INTERVENTIONS: 97164- PT Re-evaluation, 97110-Therapeutic exercises, 97530- Therapeutic activity, [id number]- Neuromuscular re-education, 97535- Self Care, [id number]- Manual therapy, [id number]- Gait training, [phone]- Electrical stimulation (unattended), [id number]- Electrical stimulation (manual), 97016- Vasopneumatic device, and Patient/Family education  PLAN FOR NEXT SESSION: assess HEP response, knee ROM, quad strength, gait   Reyes CHRISTELLA Kohut, PT 09/20/2024, 3:23 PM

## 2024-09-25 ENCOUNTER — Encounter: Payer: Self-pay | Admitting: Physical Therapy

## 2024-09-25 ENCOUNTER — Ambulatory Visit: Payer: PRIVATE HEALTH INSURANCE | Admitting: Physical Therapy

## 2024-09-25 DIAGNOSIS — R600 Localized edema: Secondary | ICD-10-CM | POA: Diagnosis present

## 2024-09-25 DIAGNOSIS — R2689 Other abnormalities of gait and mobility: Secondary | ICD-10-CM | POA: Insufficient documentation

## 2024-09-25 DIAGNOSIS — M6281 Muscle weakness (generalized): Secondary | ICD-10-CM | POA: Diagnosis present

## 2024-09-25 DIAGNOSIS — M25561 Pain in right knee: Secondary | ICD-10-CM | POA: Diagnosis present

## 2024-09-25 NOTE — Therapy (Signed)
 OUTPATIENT PHYSICAL THERAPY TREATMENT   Patient Name: Cheryl Bush MRN: [id number] DOB:August 11, 1966, 58 y.o., female Today's Date: 09/25/2024  END OF SESSION:  PT End of Session - 09/25/24 1335     Visit Number 4    Number of Visits 24    Date for Recertification  12/08/24    Authorization Type [employer]    PT Start Time 1330   15 minutes late   PT Stop Time 1415    PT Time Calculation (min) 45 min            Past Medical History:  Diagnosis Date   Arthritis    High cholesterol    Inverted nipple left   abcess removed from areola, causing some scarring and inverted nipple   Past Surgical History:  Procedure Laterality Date   KNEE SURGERY     left- arthroscopy   TOTAL KNEE ARTHROPLASTY Left 04/23/2023   Procedure: LEFT TOTAL KNEE ARTHROPLASTY;  Surgeon: Jerri Kay HERO, MD;  Location: [facility] OR;  Service: Orthopedics;  Laterality: Left;   TOTAL KNEE ARTHROPLASTY Right 09/11/2024   Procedure: ARTHROPLASTY, KNEE, TOTAL;  Surgeon: Jerri Kay HERO, MD;  Location: [facility];  Service: Orthopedics;  Laterality: Right;   Patient Active Problem List   Diagnosis Date Noted   Status post total right knee replacement 09/11/2024   Status post total left knee replacement 04/23/2023   Primary osteoarthritis of right knee 07/28/2022   Leukopenia 09/07/2016   Mastitis-left, medial aspect 03/15/2013    PCP: Rosalea Rosina SAILOR, PA   REFERRING PROVIDER: Jerri Kay HERO, MD  REFERRING DIAG: M17.11 (ICD-10-CM) - Primary osteoarthritis of right knee   THERAPY DIAG:  Acute pain of right knee  Muscle weakness (generalized)  Rationale for Evaluation and Treatment: Rehabilitation  ONSET DATE: 09/11/2024  SUBJECTIVE:   SUBJECTIVE STATEMENT: Pt reports 9/10 pain. Refilled pain medicine.   EVAL: Pt presents to PT s/p R TKA performed by Dr. Jerri on 09/11/2024. Notes that she has been very painful, having difficulty getting around house and has been fairly sedentary. Had L knee replaced in 2024 and  did fairly well with this. Wants to improve her knee ROM and pain in order to more comfortably pray.   PERTINENT HISTORY: L TKA, R TKA  PAIN:  Are you having pain?  Yes: NPRS scale: 10/10 Worst: 10/10 Pain location: R knee Pain description: sharp, sore,  Aggravating factors: standing, walking, bending Relieving factors: rest, medication  PRECAUTIONS: None  RED FLAGS: None   WEIGHT BEARING RESTRICTIONS: Yes - WBAT  FALLS:  Has patient fallen in last 6 months? No  LIVING ENVIRONMENT: Lives with: lives with their family Lives in: House/apartment Stairs: Yes: External: 2 steps; on right going up Has following equipment at home: Single point cane and Walker - 2 wheeled  OCCUPATION: [employer]  PLOF: Independent  PATIENT GOALS: improve R knee ROM and decrease pain to make prayer less painful   NEXT MD VISIT: 09/26/2024  OBJECTIVE:  Note: Objective measures were completed at Evaluation unless otherwise noted.  DIAGNOSTIC FINDINGS: See imaging   PATIENT SURVEYS:  LEFS: 5/80 - 09/14/2024    COGNITION: Overall cognitive status: Within functional limits for tasks assessed     SENSATION: WFL  POSTURE: rounded shoulders and forward head  PALPATION: TTP to distal R quad  LOWER EXTREMITY ROM:  Active ROM Right eval Right 09/25/24   Knee flexion 15 60 supine   Knee extension 55     (Blank rows = not tested)  LOWER EXTREMITY MMT:  MMT Right eval Left eval  Hip flexion    Hip extension    Hip abduction    Hip adduction    Hip internal rotation    Hip external rotation    Knee flexion DNT   Knee extension DNT   Ankle dorsiflexion    Ankle plantarflexion    Ankle inversion    Ankle eversion     (Blank rows = not tested)  LOWER EXTREMITY SPECIAL TESTS:  DNT  FUNCTIONAL TESTS:  30 Second Sit to Stand: 1 rep with UE  GAIT: Distance walked: 25ft Assistive device utilized: Environmental Consultant - 2 wheeled Level of assistance: SBA Comments: step to,  decrease knee ext, antalgic gait R  TREATMENT: [facility] Adult PT Treatment:                                                DATE: 09/25/24 Therapeutic Exercise: Nustep 5 min Heel slide seated with towel slide Self assisted seated knee flexion stretch  foot plant and scoot 10 sec x 3  to 78 degrees Right QS Right heel slide with assist SAQ x 10 PROM knee flexion and EXT   Modalities:  GameReady:  Right knee 10 minutes  38 degrees Medium compression     [facility] Adult PT Treatment:                                                DATE: 09/20/24 Therapeutic Exercise: Nustep L2 8 min for ROM Seated hamstring stretch R 30s x2 FAQs with ball 15x Seated heel slides over towel 15x Heel slides with strap 15x 75d  Modalities:  GameReady:  Right knee 10 minutes  36 degrees Medium compression   [facility] Adult PT Treatment:                                                DATE: 09/14/2024 NuStep lvl 5 UE/LE x 5 min for ROM and activity tolerance Supine QS 2x10 - 5 hold R Supine heel slide with strap 2x10 - 5 hold R (PT Assist) Seated hamstring stretch 2x30 R Seated heel slide 2x10 - 5 hold SAQs 15x Modalities:  GameReady:  Right knee 10 minutes  36 degrees Medium compression  PATIENT EDUCATION:  Education details: eval findings, LEFS, HEP, POC Person educated: Patient Education method: Explanation, Demonstration, and Handouts Education comprehension: verbalized understanding and returned demonstration  HOME EXERCISE PROGRAM: Access Code: [email or web address] URL: [email or web address] Date: 09/14/2024 Prepared by: Alm Kingdom  Exercises - Supine Ankle Pumps  - 8 x daily - 7 x weekly - 30 reps - Supine Quad Set  - 5 x daily - 7 x weekly - 2 sets - 10 reps - 5 sec hold - Supine Heel Slide with Strap  - 5 x daily - 7 x weekly - 2 sets - 10 reps - 5 sec hold - Seated Heel Slide  - 5 x daily - 7 x weekly - 2 sets - 10 reps - 5 sec hold - Seated Hamstring Stretch  - 5 x daily -  7 x weekly - 2-3  reps - 30 sec hold  ASSESSMENT:  CLINICAL IMPRESSION:  Progress limited by high pain levels in medial and posterior knee. Post op edema is present. Reviewed elevation with ice applications 3-5 x daily.   Despite 9/10 pain rating, patient able to flex knee to 78d and participate in all requested tasks.   EVAL: Patient is a 58 y.o. F who was seen today for physical therapy evaluation and treatment s/p R TKA performed by Dr. Jerri on 09/11/2024. Physical findings are consistent with surgery and recovery timeline as pt demonstrates significant R knee ROM and strength deficits. Sharp decline in gait and functional mobility, LEFS score shows severe disability in current performance of home ADLs and community activities. Pt would benefit from skilled PT services working on improving R knee ROM and strength post operatively.   OBJECTIVE IMPAIRMENTS: Abnormal gait, decreased activity tolerance, decreased balance, decreased endurance, decreased mobility, difficulty walking, decreased ROM, decreased strength, and pain.   ACTIVITY LIMITATIONS: lifting, sitting, standing, squatting, stairs, transfers, and locomotion level  PARTICIPATION LIMITATIONS: meal prep, cleaning, driving, shopping, community activity, occupation, and yard work  PERSONAL FACTORS: Time since onset of injury/illness/exacerbation and 1-2 comorbidities: LTKA are also affecting patient's functional outcome.   REHAB POTENTIAL: Good  CLINICAL DECISION MAKING: Evolving/moderate complexity  EVALUATION COMPLEXITY: Moderate   GOALS: Goals reviewed with patient? No  SHORT TERM GOALS: Target date: 10/05/2024   Pt will be compliant and knowledgeable with initial HEP for improved comfort and carryover Baseline: initial HEP given  Goal status: INITIAL  2.  Pt will self report right knee pain no greater than 7/10 for improved comfort and functional ability Baseline: 10/10 at worst Goal status: INITIAL   LONG TERM GOALS:  Target date: 12/08/2024   Pt will improve LEFS to no less than 30/80 as proxy for functional improvement with home ADLs and higher level community activity Baseline: 5/80 Goal status: INITIAL   2.  Pt will self report right knee pain no greater than 4/10 for improved comfort and functional ability Baseline: 10/10 at worst Goal status: INITIAL   3.  Pt will increase 30 Second Sit to Stand rep count to no less than 8 reps for improved balance, strength, and functional mobility Baseline: 1 reps  Goal status: INITIAL   4.  Pt will improve R knee ROM to no less than 3-110 degrees for improved comfort and functional mobility Baseline: see chart Goal status: INITIAL  5.  Pt will be able to kneel and pray without increase in pain and return to sitting independently for improved comfort and function Baseline: unable Goal status: INITIAL    PLAN:  PT FREQUENCY: 2x/week  PT DURATION: 12 weeks  PLANNED INTERVENTIONS: 97164- PT Re-evaluation, 97110-Therapeutic exercises, 97530- Therapeutic activity, 97112- Neuromuscular re-education, 97535- Self Care, 02859- Manual therapy, [id number]- Gait training, [phone]- Electrical stimulation (unattended), [phone]- Electrical stimulation (manual), 97016- Vasopneumatic device, and Patient/Family education  PLAN FOR NEXT SESSION: assess HEP response, knee ROM, quad strength, gait   Harlene Persons, PTA 09/25/24 2:23 PM Phone: [phone] Fax: [phone]

## 2024-09-26 ENCOUNTER — Ambulatory Visit: Payer: PRIVATE HEALTH INSURANCE | Admitting: Physician Assistant

## 2024-09-26 DIAGNOSIS — M1711 Unilateral primary osteoarthritis, right knee: Secondary | ICD-10-CM

## 2024-09-26 MED ORDER — OXYCODONE-ACETAMINOPHEN 5-325 MG PO TABS: 1.0000 | ORAL_TABLET | Freq: Three times a day (TID) | ORAL | 0 refills | Status: AC | PRN

## 2024-09-26 MED ORDER — METHOCARBAMOL 750 MG PO TABS: 750.0000 mg | ORAL_TABLET | Freq: Two times a day (BID) | ORAL | 2 refills | Status: AC | PRN

## 2024-09-26 NOTE — Progress Notes (Signed)
 Post-Op Visit Note   Patient: Cheryl Bush           Date of Birth: 1966/09/07           MRN: [id number] Visit Date: 09/26/2024 PCP: Rosalea Rosina SAILOR, PA   Assessment & Plan:  Chief Complaint:  Chief Complaint  Patient presents with   Right Knee - Routine Post Op    R TKA-09/11/2024   Visit Diagnoses:  1. Primary osteoarthritis of right knee     Plan: Patient is a pleasant 58 year old female who comes in today 2 weeks status post right total knee replacement 09/11/2024.  She has been doing okay.  She has been a fair amount of pain and is taking oxycodone  and Robaxin .  She has been compliant taking a baby aspirin  twice daily for DVT prophylaxis.  She has already started outpatient physical therapy and is currently ambulating with a walker.  Examination of her right knee reveals a well-healed surgical incision.  No evidence of infection or cellulitis.  Calves are soft nontender.  She is neurovascularly intact distally.  Today, new Steri-Strips were applied.  She will continue with outpatient physical therapy.  Continue with her aspirin  twice daily for another 4 weeks.  I refilled her Percocet and Robaxin .  Follow-up in 4 weeks for repeat evaluation and 2 view x-rays of the right knee.  Call with concerns or questions.  Follow-Up Instructions: Return in about 4 weeks (around 10/24/2024).   Orders:  No orders of the defined types were placed in this encounter.  Meds ordered this encounter  Medications   oxyCODONE -acetaminophen  (PERCOCET) 5-325 MG tablet    Sig: Take 1-2 tablets by mouth 3 (three) times daily as needed. To be taken after surgery    Dispense:  20 tablet    Refill:  0   methocarbamol  (ROBAXIN ) 750 MG tablet    Sig: Take 1 tablet (750 mg total) by mouth 2 (two) times daily as needed.    Dispense:  30 tablet    Refill:  2    Imaging: No new imaging  PMFS History: Patient Active Problem List   Diagnosis Date Noted   Status post total right knee replacement  09/11/2024   Status post total left knee replacement 04/23/2023   Primary osteoarthritis of right knee 07/28/2022   Leukopenia 09/07/2016   Mastitis-left, medial aspect 03/15/2013   Past Medical History:  Diagnosis Date   Arthritis    High cholesterol    Inverted nipple left   abcess removed from areola, causing some scarring and inverted nipple    Family History  Problem Relation Age of Onset   Heart disease Brother    Breast cancer Neg Hx     Past Surgical History:  Procedure Laterality Date   KNEE SURGERY     left- arthroscopy   TOTAL KNEE ARTHROPLASTY Left 04/23/2023   Procedure: LEFT TOTAL KNEE ARTHROPLASTY;  Surgeon: Jerri Kay HERO, MD;  Location: [facility];  Service: Orthopedics;  Laterality: Left;   TOTAL KNEE ARTHROPLASTY Right 09/11/2024   Procedure: ARTHROPLASTY, KNEE, TOTAL;  Surgeon: Jerri Kay HERO, MD;  Location: [facility];  Service: Orthopedics;  Laterality: Right;   Social History   Occupational History   Not on file  Tobacco Use   Smoking status: Never   Smokeless tobacco: Never  Vaping Use   Vaping status: Never Used  Substance and Sexual Activity   Alcohol use: No   Drug use: No   Sexual activity: Never

## 2024-10-04 ENCOUNTER — Encounter: Payer: Self-pay | Admitting: Physical Therapy

## 2024-10-04 ENCOUNTER — Ambulatory Visit: Payer: PRIVATE HEALTH INSURANCE | Admitting: Physical Therapy

## 2024-10-04 DIAGNOSIS — M25561 Pain in right knee: Secondary | ICD-10-CM

## 2024-10-04 DIAGNOSIS — M6281 Muscle weakness (generalized): Secondary | ICD-10-CM

## 2024-10-04 NOTE — Therapy (Signed)
 OUTPATIENT PHYSICAL THERAPY TREATMENT   Patient Name: Cheryl Bush MRN: [id number] DOB:[birth year], 58 y.o., female Today's Date: 10/04/2024  END OF SESSION:  PT End of Session - 10/04/24 0859     Visit Number 5    Number of Visits 24    Date for Recertification  12/08/24    Authorization Type [employer]    PT Start Time [phone]    PT Stop Time 0930    PT Time Calculation (min) 40 min            Past Medical History:  Diagnosis Date   Arthritis    High cholesterol    Inverted nipple left   abcess removed from areola, causing some scarring and inverted nipple   Past Surgical History:  Procedure Laterality Date   KNEE SURGERY     left- arthroscopy   TOTAL KNEE ARTHROPLASTY Left 04/23/2023   Procedure: LEFT TOTAL KNEE ARTHROPLASTY;  Surgeon: Jerri Kay HERO, MD;  Location: [facility] OR;  Service: Orthopedics;  Laterality: Left;   TOTAL KNEE ARTHROPLASTY Right 09/11/2024   Procedure: ARTHROPLASTY, KNEE, TOTAL;  Surgeon: Jerri Kay HERO, MD;  Location: [facility] OR;  Service: Orthopedics;  Laterality: Right;   Patient Active Problem List   Diagnosis Date Noted   Status post total right knee replacement 09/11/2024   Status post total left knee replacement 04/23/2023   Primary osteoarthritis of right knee 07/28/2022   Leukopenia 09/07/2016   Mastitis-left, medial aspect 03/15/2013    PCP: Rosalea Rosina SAILOR, PA   REFERRING PROVIDER: Jerri Kay HERO, MD  REFERRING DIAG: M17.11 (ICD-10-CM) - Primary osteoarthritis of right knee   THERAPY DIAG:  Acute pain of right knee  Muscle weakness (generalized)  Rationale for Evaluation and Treatment: Rehabilitation  ONSET DATE: 09/11/2024  SUBJECTIVE:   SUBJECTIVE STATEMENT: Pt reports 9/10 pain. Refilled pain medicine.   EVAL: Pt presents to PT s/p R TKA performed by Dr. Jerri on 09/11/2024. Notes that she has been very painful, having difficulty getting around house and has been fairly sedentary. Had L knee replaced in 2024 and did fairly well  with this. Wants to improve her knee ROM and pain in order to more comfortably pray.   PERTINENT HISTORY: L TKA, R TKA  PAIN:  Are you having pain?  Yes: NPRS scale: 10/10 Worst: 10/10 Pain location: R knee Pain description: sharp, sore,  Aggravating factors: standing, walking, bending Relieving factors: rest, medication  PRECAUTIONS: None  RED FLAGS: None   WEIGHT BEARING RESTRICTIONS: Yes - WBAT  FALLS:  Has patient fallen in last 6 months? No  LIVING ENVIRONMENT: Lives with: lives with their family Lives in: House/apartment Stairs: Yes: External: 2 steps; on right going up Has following equipment at home: Single point cane and Walker - 2 wheeled  OCCUPATION: [employer]  PLOF: Independent  PATIENT GOALS: improve R knee ROM and decrease pain to make prayer less painful   NEXT MD VISIT: 09/26/2024  OBJECTIVE:  Note: Objective measures were completed at Evaluation unless otherwise noted.  DIAGNOSTIC FINDINGS: See imaging   PATIENT SURVEYS:  LEFS: 5/80 - 09/14/2024    COGNITION: Overall cognitive status: Within functional limits for tasks assessed     SENSATION: WFL  POSTURE: rounded shoulders and forward head  PALPATION: TTP to distal R quad  LOWER EXTREMITY ROM:  Active ROM Right eval Right 09/25/24 Right  10/04/24  Knee flexion 15 60 supine 85A 95 AA  Knee extension 55     (Blank rows = not tested)  LOWER EXTREMITY MMT:  MMT Right eval Left eval  Hip flexion    Hip extension    Hip abduction    Hip adduction    Hip internal rotation    Hip external rotation    Knee flexion DNT   Knee extension DNT   Ankle dorsiflexion    Ankle plantarflexion    Ankle inversion    Ankle eversion     (Blank rows = not tested)  LOWER EXTREMITY SPECIAL TESTS:  DNT  FUNCTIONAL TESTS:  30 Second Sit to Stand: 1 rep with UE  GAIT: Distance walked: 49ft Assistive device utilized: Environmental Consultant - 2 wheeled Level of assistance: SBA Comments: step  to, decrease knee ext, antalgic gait R  TREATMENT: [facility] Adult PT Treatment:                                                DATE: 10/04/24 Therapeutic Exercise: Nustep L3 UE/LE x 5 min for ROM Seated scoot stretch for knee flexion ROM x2 Seated H.s stretch x 2  LAQ with ball squeeze  Supine heel slide with strap and without  SAQ 5 sec x 10  Assisted SLR with strap   Modalities:  GameReady:  Right knee 10 minutes  38 degrees Medium compression    [facility] Adult PT Treatment:                                                DATE: 09/25/24 Therapeutic Exercise: Nustep 5 min Heel slide seated with towel slide Self assisted seated knee flexion stretch  foot plant and scoot 10 sec x 3  to 78 degrees Right QS Right heel slide with assist SAQ x 10  PROM knee flexion and EXT   Modalities:  GameReady:  Right knee 10 minutes  38 degrees Medium compression     [facility] Adult PT Treatment:                                                DATE: 09/20/24 Therapeutic Exercise: Nustep L2 8 min for ROM Seated hamstring stretch R 30s x2 FAQs with ball 15x Seated heel slides over towel 15x Heel slides with strap 15x 75d  Modalities:  GameReady:  Right knee 10 minutes  36 degrees Medium compression   [facility] Adult PT Treatment:                                                DATE: 09/14/2024 NuStep lvl 5 UE/LE x 5 min for ROM and activity tolerance Supine QS 2x10 - 5 hold R Supine heel slide with strap 2x10 - 5 hold R (PT Assist) Seated hamstring stretch 2x30 R Seated heel slide 2x10 - 5 hold SAQs 15x Modalities:  GameReady:  Right knee 10 minutes  36 degrees Medium compression  PATIENT EDUCATION:  Education details: eval findings, LEFS, HEP, POC Person educated: Patient Education method: Explanation, Demonstration, and Handouts Education comprehension: verbalized understanding and returned demonstration  HOME EXERCISE PROGRAM: Access Code: [email or web address] URL:  [email or web address] Date: 09/14/2024 Prepared by: Alm Kingdom  Exercises - Supine Ankle Pumps  - 8 x daily - 7 x weekly - 30 reps - Supine Quad Set  - 5 x daily - 7 x weekly - 2 sets - 10 reps - 5 sec hold - Supine Heel Slide with Strap  - 5 x daily - 7 x weekly - 2 sets - 10 reps - 5 sec hold - Seated Heel Slide  - 5 x daily - 7 x weekly - 2 sets - 10 reps - 5 sec hold - Seated Hamstring Stretch  - 5 x daily - 7 x weekly - 2-3 reps - 30 sec hold  ASSESSMENT:  CLINICAL IMPRESSION:  Progress limited by high pain levels in medial and posterior knee. Post op edema is still present. Reviewed elevation with ice applications 3-5 x daily.   Despite 9/10 pain rating, patient able to flex knee to 95 d  AAROM and participate in all requested tasks. She requires Assist with SLR.   EVAL: Patient is a 58 y.o. F who was seen today for physical therapy evaluation and treatment s/p R TKA performed by Dr. Jerri on 09/11/2024. Physical findings are consistent with surgery and recovery timeline as pt demonstrates significant R knee ROM and strength deficits. Sharp decline in gait and functional mobility, LEFS score shows severe disability in current performance of home ADLs and community activities. Pt would benefit from skilled PT services working on improving R knee ROM and strength post operatively.   OBJECTIVE IMPAIRMENTS: Abnormal gait, decreased activity tolerance, decreased balance, decreased endurance, decreased mobility, difficulty walking, decreased ROM, decreased strength, and pain.   ACTIVITY LIMITATIONS: lifting, sitting, standing, squatting, stairs, transfers, and locomotion level  PARTICIPATION LIMITATIONS: meal prep, cleaning, driving, shopping, community activity, occupation, and yard work  PERSONAL FACTORS: Time since onset of injury/illness/exacerbation and 1-2 comorbidities: LTKA are also affecting patient's functional outcome.   REHAB POTENTIAL: Good  CLINICAL DECISION  MAKING: Evolving/moderate complexity  EVALUATION COMPLEXITY: Moderate   GOALS: Goals reviewed with patient? No  SHORT TERM GOALS: Target date: 10/05/2024   Pt will be compliant and knowledgeable with initial HEP for improved comfort and carryover Baseline: initial HEP given  Goal status: INITIAL  2.  Pt will self report right knee pain no greater than 7/10 for improved comfort and functional ability Baseline: 10/10 at worst Goal status: INITIAL   LONG TERM GOALS: Target date: 12/08/2024   Pt will improve LEFS to no less than 30/80 as proxy for functional improvement with home ADLs and higher level community activity Baseline: 5/80 Goal status: INITIAL   2.  Pt will self report right knee pain no greater than 4/10 for improved comfort and functional ability Baseline: 10/10 at worst Goal status: INITIAL   3.  Pt will increase 30 Second Sit to Stand rep count to no less than 8 reps for improved balance, strength, and functional mobility Baseline: 1 reps  Goal status: INITIAL   4.  Pt will improve R knee ROM to no less than 3-110 degrees for improved comfort and functional mobility Baseline: see chart Goal status: INITIAL  5.  Pt will be able to kneel and pray without increase in pain and return to sitting independently for improved comfort and function Baseline: unable Goal status: INITIAL    PLAN:  PT FREQUENCY: 2x/week  PT DURATION: 12 weeks  PLANNED INTERVENTIONS: 97164- PT Re-evaluation, 97110-Therapeutic exercises, 97530- Therapeutic activity, [id number]- Neuromuscular  re-education, [phone]- Self Care, 02859- Manual therapy, [phone]- Gait training, [phone]- Electrical stimulation (unattended), [phone]- Electrical stimulation (manual), 97016- Vasopneumatic device, and Patient/Family education  PLAN FOR NEXT SESSION: assess HEP response, knee ROM, quad strength, gait   Harlene Persons, PTA 10/04/24 1:43 PM Phone: [phone] Fax: [phone]

## 2024-10-11 ENCOUNTER — Ambulatory Visit: Payer: PRIVATE HEALTH INSURANCE

## 2024-10-11 DIAGNOSIS — R600 Localized edema: Secondary | ICD-10-CM

## 2024-10-11 DIAGNOSIS — R2689 Other abnormalities of gait and mobility: Secondary | ICD-10-CM

## 2024-10-11 DIAGNOSIS — M25561 Pain in right knee: Secondary | ICD-10-CM | POA: Diagnosis not present

## 2024-10-11 DIAGNOSIS — M6281 Muscle weakness (generalized): Secondary | ICD-10-CM

## 2024-10-11 NOTE — Therapy (Signed)
 OUTPATIENT PHYSICAL THERAPY TREATMENT   Patient Name: Cheryl Bush MRN: [id number] DOB:01-27-1966, 58 y.o., female Today's Date: 10/11/2024  END OF SESSION:  PT End of Session - 10/11/24 1453     Visit Number 6    Number of Visits 24    Date for Recertification  12/08/24    Authorization Type [employer]    PT Start Time 1450    PT Stop Time 1525    PT Time Calculation (min) 35 min    Activity Tolerance Patient limited by pain    Behavior During Therapy WFL for tasks assessed/performed             Past Medical History:  Diagnosis Date   Arthritis    High cholesterol    Inverted nipple left   abcess removed from areola, causing some scarring and inverted nipple   Past Surgical History:  Procedure Laterality Date   KNEE SURGERY     left- arthroscopy   TOTAL KNEE ARTHROPLASTY Left 04/23/2023   Procedure: LEFT TOTAL KNEE ARTHROPLASTY;  Surgeon: Jerri Kay HERO, MD;  Location: [facility] OR;  Service: Orthopedics;  Laterality: Left;   TOTAL KNEE ARTHROPLASTY Right 09/11/2024   Procedure: ARTHROPLASTY, KNEE, TOTAL;  Surgeon: Jerri Kay HERO, MD;  Location: [facility] OR;  Service: Orthopedics;  Laterality: Right;   Patient Active Problem List   Diagnosis Date Noted   Status post total right knee replacement 09/11/2024   Status post total left knee replacement 04/23/2023   Primary osteoarthritis of right knee 07/28/2022   Leukopenia 09/07/2016   Mastitis-left, medial aspect 03/15/2013    PCP: Rosalea Rosina SAILOR, PA   REFERRING PROVIDER: Jerri Kay HERO, MD  REFERRING DIAG: M17.11 (ICD-10-CM) - Primary osteoarthritis of right knee   THERAPY DIAG:  Acute pain of right knee  Muscle weakness (generalized)  Other abnormalities of gait and mobility  Localized edema  Rationale for Evaluation and Treatment: Rehabilitation  ONSET DATE: 09/11/2024  SUBJECTIVE:   SUBJECTIVE STATEMENT: Pt reports R knee is painful right now.   EVAL: Pt presents to PT s/p R TKA performed by Dr. Jerri on  09/11/2024. Notes that she has been very painful, having difficulty getting around house and has been fairly sedentary. Had L knee replaced in 2024 and did fairly well with this. Wants to improve her knee ROM and pain in order to more comfortably pray.   PERTINENT HISTORY: L TKA, R TKA  PAIN:  Are you having pain?  Yes: NPRS scale: 10/10 Worst: 10/10 Pain location: R knee Pain description: sharp, sore,  Aggravating factors: standing, walking, bending Relieving factors: rest, medication  PRECAUTIONS: None  RED FLAGS: None   WEIGHT BEARING RESTRICTIONS: Yes - WBAT  FALLS:  Has patient fallen in last 6 months? No  LIVING ENVIRONMENT: Lives with: lives with their family Lives in: House/apartment Stairs: Yes: External: 2 steps; on right going up Has following equipment at home: Single point cane and Walker - 2 wheeled  OCCUPATION: [employer]  PLOF: Independent  PATIENT GOALS: improve R knee ROM and decrease pain to make prayer less painful   NEXT MD VISIT: 09/26/2024  OBJECTIVE:  Note: Objective measures were completed at Evaluation unless otherwise noted.  DIAGNOSTIC FINDINGS: See imaging   PATIENT SURVEYS:  LEFS: 5/80 - 09/14/2024    COGNITION: Overall cognitive status: Within functional limits for tasks assessed     SENSATION: WFL  POSTURE: rounded shoulders and forward head  PALPATION: TTP to distal R quad  LOWER EXTREMITY ROM:  Active ROM Right eval Right 09/25/24 Right  10/04/24  Knee flexion 15 60 supine 85A 95 AA  Knee extension 55     (Blank rows = not tested)  LOWER EXTREMITY MMT:  MMT Right eval Left eval  Hip flexion    Hip extension    Hip abduction    Hip adduction    Hip internal rotation    Hip external rotation    Knee flexion DNT   Knee extension DNT   Ankle dorsiflexion    Ankle plantarflexion    Ankle inversion    Ankle eversion     (Blank rows = not tested)  LOWER EXTREMITY SPECIAL TESTS:  DNT  FUNCTIONAL  TESTS:  30 Second Sit to Stand: 1 rep with UE  GAIT: Distance walked: 84ft Assistive device utilized: Environmental Consultant - 2 wheeled Level of assistance: SBA Comments: step to, decrease knee ext, antalgic gait R  TREATMENT:   [facility] Adult PT Treatment:                                                DATE: 10/11/24 Therapeutic Exercise: Nustep L3 UE/LE x 8 min for ROM Seated H.s stretch x 5 LAQ with ball squeeze  Supine heel slide with min A from clinician  SAQ 5 sec x 10  Updated and reviewed HEP   Modalities:  GameReady:  Right knee 15 minutes  38 degrees Medium compression LE elevated on wedge, changed to pillow at 7 minutes left   [facility] Adult PT Treatment:                                                DATE: 10/04/24 Therapeutic Exercise: Nustep L3 UE/LE x 5 min for ROM Seated scoot stretch for knee flexion ROM x2 Seated H.s stretch x 2  LAQ with ball squeeze  Supine heel slide with strap and without  SAQ 5 sec x 10  Assisted SLR with strap   Modalities:  GameReady:  Right knee 10 minutes  38 degrees Medium compression    [facility] Adult PT Treatment:                                                DATE: 09/25/24 Therapeutic Exercise: Nustep 5 min Heel slide seated with towel slide Self assisted seated knee flexion stretch  foot plant and scoot 10 sec x 3  to 78 degrees Right QS Right heel slide with assist SAQ x 10  PROM knee flexion and EXT   Modalities:  GameReady:  Right knee 10 minutes  38 degrees Medium compression     [facility] Adult PT Treatment:                                                DATE: 09/20/24 Therapeutic Exercise: Nustep L2 8 min for ROM Seated hamstring stretch R 30s x2 FAQs with ball 15x Seated heel slides over towel 15x Heel slides with strap 15x 75d  Modalities:  GameReady:  Right knee 10 minutes  36 degrees Medium compression   [facility] Adult PT Treatment:                                                DATE: 09/14/2024 NuStep lvl 5  UE/LE x 5 min for ROM and activity tolerance Supine QS 2x10 - 5 hold R Supine heel slide with strap 2x10 - 5 hold R (PT Assist) Seated hamstring stretch 2x30 R Seated heel slide 2x10 - 5 hold SAQs 15x Modalities:  GameReady:  Right knee 10 minutes  36 degrees Medium compression  PATIENT EDUCATION:  Education details: eval findings, LEFS, HEP, POC Person educated: Patient Education method: Explanation, Demonstration, and Handouts Education comprehension: verbalized understanding and returned demonstration  HOME EXERCISE PROGRAM: Access Code: [email or web address] URL: [email or web address] Date: 09/14/2024 Prepared by: Alm Kingdom  Exercises - Supine Ankle Pumps  - 8 x daily - 7 x weekly - 30 reps - Supine Quad Set  - 5 x daily - 7 x weekly - 2 sets - 10 reps - 5 sec hold - Supine Heel Slide with Strap  - 5 x daily - 7 x weekly - 2 sets - 10 reps - 5 sec hold - Seated Heel Slide  - 5 x daily - 7 x weekly - 2 sets - 10 reps - 5 sec hold - Seated Hamstring Stretch  - 5 x daily - 7 x weekly - 2-3 reps - 30 sec hold  ASSESSMENT:  CLINICAL IMPRESSION:  Patient continues to be limited by pain severity and post op edema. Updated HEP and provided patient with written copy, d/t scheduling/transportation challenges. She requires assist with heel slides. We will continue to progress as appropriate.   EVAL: Patient is a 58 y.o. F who was seen today for physical therapy evaluation and treatment s/p R TKA performed by Dr. Jerri on 09/11/2024. Physical findings are consistent with surgery and recovery timeline as pt demonstrates significant R knee ROM and strength deficits. Sharp decline in gait and functional mobility, LEFS score shows severe disability in current performance of home ADLs and community activities. Pt would benefit from skilled PT services working on improving R knee ROM and strength post operatively.   OBJECTIVE IMPAIRMENTS: Abnormal gait, decreased activity tolerance,  decreased balance, decreased endurance, decreased mobility, difficulty walking, decreased ROM, decreased strength, and pain.   ACTIVITY LIMITATIONS: lifting, sitting, standing, squatting, stairs, transfers, and locomotion level  PARTICIPATION LIMITATIONS: meal prep, cleaning, driving, shopping, community activity, occupation, and yard work  PERSONAL FACTORS: Time since onset of injury/illness/exacerbation and 1-2 comorbidities: LTKA are also affecting patient's functional outcome.   REHAB POTENTIAL: Good  CLINICAL DECISION MAKING: Evolving/moderate complexity  EVALUATION COMPLEXITY: Moderate   GOALS: Goals reviewed with patient? No  SHORT TERM GOALS: Target date: 10/05/2024   Pt will be compliant and knowledgeable with initial HEP for improved comfort and carryover Baseline: initial HEP given  Goal status: INITIAL  2.  Pt will self report right knee pain no greater than 7/10 for improved comfort and functional ability Baseline: 10/10 at worst Goal status: INITIAL   LONG TERM GOALS: Target date: 12/08/2024   Pt will improve LEFS to no less than 30/80 as proxy for functional improvement with home ADLs and higher level community activity Baseline: 5/80 Goal status: INITIAL   2.  Pt will self report  right knee pain no greater than 4/10 for improved comfort and functional ability Baseline: 10/10 at worst Goal status: INITIAL   3.  Pt will increase 30 Second Sit to Stand rep count to no less than 8 reps for improved balance, strength, and functional mobility Baseline: 1 reps  Goal status: INITIAL   4.  Pt will improve R knee ROM to no less than 3-110 degrees for improved comfort and functional mobility Baseline: see chart Goal status: INITIAL  5.  Pt will be able to kneel and pray without increase in pain and return to sitting independently for improved comfort and function Baseline: unable Goal status: INITIAL    PLAN:  PT FREQUENCY: 2x/week  PT DURATION: 12  weeks  PLANNED INTERVENTIONS: 97164- PT Re-evaluation, 97110-Therapeutic exercises, 97530- Therapeutic activity, [id number]- Neuromuscular re-education, 97535- Self Care, 02859- Manual therapy, [id number]- Gait training, [phone]- Electrical stimulation (unattended), [id number]- Electrical stimulation (manual), 97016- Vasopneumatic device, and Patient/Family education  PLAN FOR NEXT SESSION: assess HEP response, knee ROM, quad strength, gait   Marko Molt, PT, DPT  10/12/2024 8:02 AM

## 2024-10-24 ENCOUNTER — Ambulatory Visit: Payer: PRIVATE HEALTH INSURANCE | Admitting: Physical Therapy

## 2024-10-24 ENCOUNTER — Encounter: Payer: Self-pay | Admitting: Physical Therapy

## 2024-10-24 DIAGNOSIS — M25561 Pain in right knee: Secondary | ICD-10-CM

## 2024-10-24 DIAGNOSIS — M6281 Muscle weakness (generalized): Secondary | ICD-10-CM

## 2024-10-24 NOTE — Therapy (Signed)
 " OUTPATIENT PHYSICAL THERAPY TREATMENT   Patient Name: Cheryl Bush MRN: [id number] DOB:01/08/1966, 58 y.o., female Today's Date: 10/24/2024  END OF SESSION:  PT End of Session - 10/24/24 0804     Visit Number 7    Number of Visits 24    Date for Recertification  12/08/24    Authorization Type [employer]    PT Start Time 0802    PT Stop Time 0855    PT Time Calculation (min) 53 min             Past Medical History:  Diagnosis Date   Arthritis    High cholesterol    Inverted nipple left   abcess removed from areola, causing some scarring and inverted nipple   Past Surgical History:  Procedure Laterality Date   KNEE SURGERY     left- arthroscopy   TOTAL KNEE ARTHROPLASTY Left 04/23/2023   Procedure: LEFT TOTAL KNEE ARTHROPLASTY;  Surgeon: Jerri Kay HERO, MD;  Location: [facility] OR;  Service: Orthopedics;  Laterality: Left;   TOTAL KNEE ARTHROPLASTY Right 09/11/2024   Procedure: ARTHROPLASTY, KNEE, TOTAL;  Surgeon: Jerri Kay HERO, MD;  Location: [facility] OR;  Service: Orthopedics;  Laterality: Right;   Patient Active Problem List   Diagnosis Date Noted   Status post total right knee replacement 09/11/2024   Status post total left knee replacement 04/23/2023   Primary osteoarthritis of right knee 07/28/2022   Leukopenia 09/07/2016   Mastitis-left, medial aspect 03/15/2013    PCP: Rosalea Rosina SAILOR, PA   REFERRING PROVIDER: Jerri Kay HERO, MD  REFERRING DIAG: M17.11 (ICD-10-CM) - Primary osteoarthritis of right knee   THERAPY DIAG:  Acute pain of right knee  Muscle weakness (generalized)  Rationale for Evaluation and Treatment: Rehabilitation  ONSET DATE: 09/11/2024  SUBJECTIVE:   SUBJECTIVE STATEMENT: Pt reports R knee is painful right now.   EVAL: Pt presents to PT s/p R TKA performed by Dr. Jerri on 09/11/2024. Notes that she has been very painful, having difficulty getting around house and has been fairly sedentary. Had L knee replaced in 2024 and did fairly well with  this. Wants to improve her knee ROM and pain in order to more comfortably pray.   PERTINENT HISTORY: L TKA, R TKA  PAIN:  Are you having pain?  Yes: NPRS scale: 10/10 Worst: 10/10 Pain location: R knee Pain description: sharp, sore,  Aggravating factors: standing, walking, bending Relieving factors: rest, medication  PRECAUTIONS: None  RED FLAGS: None   WEIGHT BEARING RESTRICTIONS: Yes - WBAT  FALLS:  Has patient fallen in last 6 months? No  LIVING ENVIRONMENT: Lives with: lives with their family Lives in: House/apartment Stairs: Yes: External: 2 steps; on right going up Has following equipment at home: Single point cane and Walker - 2 wheeled  OCCUPATION: [employer]  PLOF: Independent  PATIENT GOALS: improve R knee ROM and decrease pain to make prayer less painful   NEXT MD VISIT: 09/26/2024  OBJECTIVE:  Note: Objective measures were completed at Evaluation unless otherwise noted.  DIAGNOSTIC FINDINGS: See imaging   PATIENT SURVEYS:  LEFS: 5/80 - 09/14/2024    COGNITION: Overall cognitive status: Within functional limits for tasks assessed     SENSATION: WFL  POSTURE: rounded shoulders and forward head  PALPATION: TTP to distal R quad  LOWER EXTREMITY ROM:  Active ROM Right eval Right 09/25/24 Right  10/04/24 Right 10/24/24  Knee flexion 15 60 supine 85A 95 AA 101 AA  Knee extension 55      (  Blank rows = not tested)  LOWER EXTREMITY MMT:  MMT Right eval Left eval  Hip flexion    Hip extension    Hip abduction    Hip adduction    Hip internal rotation    Hip external rotation    Knee flexion DNT   Knee extension DNT   Ankle dorsiflexion    Ankle plantarflexion    Ankle inversion    Ankle eversion     (Blank rows = not tested)  LOWER EXTREMITY SPECIAL TESTS:  DNT  FUNCTIONAL TESTS:  30 Second Sit to Stand: 1 rep with UE  GAIT: Distance walked: 69ft Assistive device utilized: Environmental Consultant - 2 wheeled Level of assistance:  SBA Comments: step to, decrease knee ext, antalgic gait R  TREATMENT: [facility] Adult PT Treatment:                                                DATE: 10/11/24 Therapeutic activity/ neur re-ed Nustep L4 UE/LE x 8 min for ROM Seated heel slide  Seated H.s stretch x 2 LAQ with ball squeeze  x 8 Supine heel slide to march x 8 Unable to SLR SLR with assist from strap x 10 SAQ 5 sec x 10 , 2# x 10  QS 5 sec 5 x 2  Updated and reviewed HEP   Modalities:  GameReady:  Right knee 15 minutes  38 degrees Medium compression  [facility] Adult PT Treatment:                                                DATE: 10/11/24 Therapeutic Exercise: Nustep L3 UE/LE x 8 min for ROM Seated H.s stretch x 5 LAQ with ball squeeze  Supine heel slide with min A from clinician  SAQ 5 sec x 10  Updated and reviewed HEP   Modalities:  GameReady:  Right knee 15 minutes  38 degrees Medium compression LE elevated on wedge, changed to pillow at 7 minutes left   [facility] Adult PT Treatment:                                                DATE: 10/04/24 Therapeutic Exercise: Nustep L3 UE/LE x 5 min for ROM Seated scoot stretch for knee flexion ROM x2 Seated H.s stretch x 2  LAQ with ball squeeze  Supine heel slide with strap and without  SAQ 5 sec x 10  Assisted SLR with strap   Modalities:  GameReady:  Right knee 10 minutes  38 degrees Medium compression    [facility] Adult PT Treatment:                                                DATE: 09/25/24 Therapeutic Exercise: Nustep 5 min Heel slide seated with towel slide Self assisted seated knee flexion stretch  foot plant and scoot 10 sec x 3  to 78 degrees Right QS Right heel slide with assist SAQ x 10  PROM  knee flexion and EXT   Modalities:  GameReady:  Right knee 10 minutes  38 degrees Medium compression     [facility] Adult PT Treatment:                                                DATE: 09/20/24 Therapeutic Exercise: Nustep L2 8 min for  ROM Seated hamstring stretch R 30s x2 FAQs with ball 15x Seated heel slides over towel 15x Heel slides with strap 15x 75d  Modalities:  GameReady:  Right knee 10 minutes  36 degrees Medium compression   [facility] Adult PT Treatment:                                                DATE: 09/14/2024 NuStep lvl 5 UE/LE x 5 min for ROM and activity tolerance Supine QS 2x10 - 5 hold R Supine heel slide with strap 2x10 - 5 hold R (PT Assist) Seated hamstring stretch 2x30 R Seated heel slide 2x10 - 5 hold SAQs 15x Modalities:  GameReady:  Right knee 10 minutes  36 degrees Medium compression  PATIENT EDUCATION:  Education details: eval findings, LEFS, HEP, POC Person educated: Patient Education method: Explanation, Demonstration, and Handouts Education comprehension: verbalized understanding and returned demonstration  HOME EXERCISE PROGRAM: Access Code: [email or web address] URL: [email or web address] Date: 09/14/2024 Prepared by: Alm Kingdom  Exercises - Supine Ankle Pumps  - 8 x daily - 7 x weekly - 30 reps - Supine Quad Set  - 5 x daily - 7 x weekly - 2 sets - 10 reps - 5 sec hold - Supine Heel Slide with Strap  - 5 x daily - 7 x weekly - 2 sets - 10 reps - 5 sec hold - Seated Heel Slide  - 5 x daily - 7 x weekly - 2 sets - 10 reps - 5 sec hold - Seated Hamstring Stretch  - 5 x daily - 7 x weekly - 2-3 reps - 30 sec hold - Seated Long Arc Quad  - 5 x daily - 7 x weekly - 2 sets - 10 reps - ASSISTED Active straight leg raise  - 1 x daily - 7 x weekly - 1 sets - 10 reps - Supine March  - 1 x daily - 7 x weekly - 1 sets - 10 reps  ASSESSMENT:  CLINICAL IMPRESSION:  Patient continues to be limited by pain severity and post op edema. Updated HEP and provided patient with written copy. . She requires assist with SLR. We will continue to progress as appropriate.   EVAL: Patient is a 58 y.o. F who was seen today for physical therapy evaluation and treatment s/p R TKA  performed by Dr. Jerri on 09/11/2024. Physical findings are consistent with surgery and recovery timeline as pt demonstrates significant R knee ROM and strength deficits. Sharp decline in gait and functional mobility, LEFS score shows severe disability in current performance of home ADLs and community activities. Pt would benefit from skilled PT services working on improving R knee ROM and strength post operatively.   OBJECTIVE IMPAIRMENTS: Abnormal gait, decreased activity tolerance, decreased balance, decreased endurance, decreased mobility, difficulty walking, decreased ROM, decreased strength, and pain.   ACTIVITY LIMITATIONS: lifting, sitting, standing, squatting,  stairs, transfers, and locomotion level  PARTICIPATION LIMITATIONS: meal prep, cleaning, driving, shopping, community activity, occupation, and yard work  PERSONAL FACTORS: Time since onset of injury/illness/exacerbation and 1-2 comorbidities: LTKA are also affecting patient's functional outcome.   REHAB POTENTIAL: Good  CLINICAL DECISION MAKING: Evolving/moderate complexity  EVALUATION COMPLEXITY: Moderate   GOALS: Goals reviewed with patient? No  SHORT TERM GOALS: Target date: 10/05/2024   Pt will be compliant and knowledgeable with initial HEP for improved comfort and carryover Baseline: initial HEP given  Goal status: MET   2.  Pt will self report right knee pain no greater than 7/10 for improved comfort and functional ability Baseline: 10/10 at worst 10/24/24: 8/10 Goal status: ONGOING   LONG TERM GOALS: Target date: 12/08/2024   Pt will improve LEFS to no less than 30/80 as proxy for functional improvement with home ADLs and higher level community activity Baseline: 5/80 Goal status: INITIAL   2.  Pt will self report right knee pain no greater than 4/10 for improved comfort and functional ability Baseline: 10/10 at worst Goal status: INITIAL   3.  Pt will increase 30 Second Sit to Stand rep count to no less  than 8 reps for improved balance, strength, and functional mobility Baseline: 1 reps  Goal status: INITIAL   4.  Pt will improve R knee ROM to no less than 3-110 degrees for improved comfort and functional mobility Baseline: see chart Goal status: INITIAL  5.  Pt will be able to kneel and pray without increase in pain and return to sitting independently for improved comfort and function Baseline: unable Goal status: INITIAL    PLAN:  PT FREQUENCY: 2x/week  PT DURATION: 12 weeks  PLANNED INTERVENTIONS: 97164- PT Re-evaluation, 97110-Therapeutic exercises, 97530- Therapeutic activity, 97112- Neuromuscular re-education, 97535- Self Care, [id number]- Manual therapy, [id number]- Gait training, [phone]- Electrical stimulation (unattended), [phone]- Electrical stimulation (manual), 97016- Vasopneumatic device, and Patient/Family education  PLAN FOR NEXT SESSION: assess HEP response, knee ROM, quad strength, gait   Harlene Persons, PTA 10/24/2024 8:47 AM Phone: [phone] Fax: [phone]   "

## 2024-10-27 ENCOUNTER — Ambulatory Visit: Payer: PRIVATE HEALTH INSURANCE | Admitting: Physician Assistant

## 2024-10-27 ENCOUNTER — Other Ambulatory Visit: Payer: Self-pay

## 2024-10-27 DIAGNOSIS — M1711 Unilateral primary osteoarthritis, right knee: Secondary | ICD-10-CM

## 2024-10-27 NOTE — Progress Notes (Signed)
" ° °  Post-Op Visit Note   Patient: Cheryl Bush           Date of Birth: 1966-08-19           MRN: [id number] Visit Date: 10/27/2024 PCP: Rosalea Rosina SAILOR, PA   Assessment & Plan:  Chief Complaint:  Chief Complaint  Patient presents with   Right Knee - Routine Post Op    R TKA-09/11/2024   Visit Diagnoses:  1. Primary osteoarthritis of right knee     Plan: Patient is a pleasant 59 year old female who comes in today 6 weeks status post right total knee replacement.  She has been doing okay but is still in a fair amount of pain.  She has been taking oxycodone  as needed.  She has been in physical therapy and is ambulating with a walker.  She has been compliant taking baby aspirin  twice daily for DVT prophylaxis.  Examination of the right knee reveals a fully healed surgical scar without complication.  Range of motion from approximately 5 to 100 degrees.  She is stable to valgus and varus stress.  She is neurovascular intact distally.  At this point, she will discontinue the use of her aspirin  from a DVT prophylactic standpoint.  Continue with physical therapy as well as a home exercise program.  Follow-up in 6 weeks for recheck.  Call with concerns or questions.  Follow-Up Instructions: Return in about 4 weeks (around 11/24/2024).   Orders:  Orders Placed This Encounter  Procedures   XR Knee 1-2 Views Right   No orders of the defined types were placed in this encounter.   Imaging: XR Knee 1-2 Views Right Result Date: 10/27/2024 Well-seated prosthesis without complication   PMFS History: Patient Active Problem List   Diagnosis Date Noted   Status post total right knee replacement 09/11/2024   Status post total left knee replacement 04/23/2023   Primary osteoarthritis of right knee 07/28/2022   Leukopenia 09/07/2016   Mastitis-left, medial aspect 03/15/2013   Past Medical History:  Diagnosis Date   Arthritis    High cholesterol    Inverted nipple left   abcess removed  from areola, causing some scarring and inverted nipple    Family History  Problem Relation Age of Onset   Heart disease Brother    Breast cancer Neg Hx     Past Surgical History:  Procedure Laterality Date   KNEE SURGERY     left- arthroscopy   TOTAL KNEE ARTHROPLASTY Left 04/23/2023   Procedure: LEFT TOTAL KNEE ARTHROPLASTY;  Surgeon: Jerri Kay HERO, MD;  Location: [facility] OR;  Service: Orthopedics;  Laterality: Left;   TOTAL KNEE ARTHROPLASTY Right 09/11/2024   Procedure: ARTHROPLASTY, KNEE, TOTAL;  Surgeon: Jerri Kay HERO, MD;  Location: [facility] OR;  Service: Orthopedics;  Laterality: Right;   Social History   Occupational History   Not on file  Tobacco Use   Smoking status: Never   Smokeless tobacco: Never  Vaping Use   Vaping status: Never Used  Substance and Sexual Activity   Alcohol use: No   Drug use: No   Sexual activity: Never     "

## 2024-11-07 ENCOUNTER — Ambulatory Visit: Payer: PRIVATE HEALTH INSURANCE | Attending: Orthopaedic Surgery | Admitting: Physical Therapy

## 2024-11-07 ENCOUNTER — Encounter: Payer: Self-pay | Admitting: Physical Therapy

## 2024-11-07 DIAGNOSIS — M25561 Pain in right knee: Secondary | ICD-10-CM | POA: Insufficient documentation

## 2024-11-07 DIAGNOSIS — M6281 Muscle weakness (generalized): Secondary | ICD-10-CM | POA: Insufficient documentation

## 2024-11-07 NOTE — Therapy (Signed)
 " OUTPATIENT PHYSICAL THERAPY TREATMENT   Patient Name: Cheryl Bush MRN: [id number] DOB:[birth year], 59 y.o., female Today's Date: 11/07/2024  END OF SESSION:  PT End of Session - 11/07/24 1014     Visit Number 8    Number of Visits 24    Date for Recertification  12/08/24    Authorization Type [employer]    PT Start Time 1015    PT Stop Time 1100    PT Time Calculation (min) 45 min             Past Medical History:  Diagnosis Date   Arthritis    High cholesterol    Inverted nipple left   abcess removed from areola, causing some scarring and inverted nipple   Past Surgical History:  Procedure Laterality Date   KNEE SURGERY     left- arthroscopy   TOTAL KNEE ARTHROPLASTY Left 04/23/2023   Procedure: LEFT TOTAL KNEE ARTHROPLASTY;  Surgeon: Jerri Kay HERO, MD;  Location: [facility] OR;  Service: Orthopedics;  Laterality: Left;   TOTAL KNEE ARTHROPLASTY Right 09/11/2024   Procedure: ARTHROPLASTY, KNEE, TOTAL;  Surgeon: Jerri Kay HERO, MD;  Location: [facility] OR;  Service: Orthopedics;  Laterality: Right;   Patient Active Problem List   Diagnosis Date Noted   Status post total right knee replacement 09/11/2024   Status post total left knee replacement 04/23/2023   Primary osteoarthritis of right knee 07/28/2022   Leukopenia 09/07/2016   Mastitis-left, medial aspect 03/15/2013    PCP: Rosalea Rosina SAILOR, PA   REFERRING PROVIDER: Jerri Kay HERO, MD  REFERRING DIAG: M17.11 (ICD-10-CM) - Primary osteoarthritis of right knee   THERAPY DIAG:  Acute pain of right knee  Muscle weakness (generalized)  Rationale for Evaluation and Treatment: Rehabilitation  ONSET DATE: 09/11/2024  SUBJECTIVE:   SUBJECTIVE STATEMENT: Pt reports R knee is still painful. She is walking around home without AD. Uses RW outside of home.   EVAL: Pt presents to PT s/p R TKA performed by Dr. Jerri on 09/11/2024. Notes that she has been very painful, having difficulty getting around house and has been fairly  sedentary. Had L knee replaced in 2024 and did fairly well with this. Wants to improve her knee ROM and pain in order to more comfortably pray.   PERTINENT HISTORY: L TKA, R TKA  PAIN:  Are you having pain?  Yes: NPRS scale: 10/10 Worst: 10/10 Pain location: R knee Pain description: sharp, sore,  Aggravating factors: standing, walking, bending Relieving factors: rest, medication  PRECAUTIONS: None  RED FLAGS: None   WEIGHT BEARING RESTRICTIONS: Yes - WBAT  FALLS:  Has patient fallen in last 6 months? No  LIVING ENVIRONMENT: Lives with: lives with their family Lives in: House/apartment Stairs: Yes: External: 2 steps; on right going up Has following equipment at home: Single point cane and Walker - 2 wheeled  OCCUPATION: [employer]  PLOF: Independent  PATIENT GOALS: improve R knee ROM and decrease pain to make prayer less painful   NEXT MD VISIT: 09/26/2024  OBJECTIVE:  Note: Objective measures were completed at Evaluation unless otherwise noted.  DIAGNOSTIC FINDINGS: See imaging   PATIENT SURVEYS:  LEFS: 5/80 - 09/14/2024    COGNITION: Overall cognitive status: Within functional limits for tasks assessed     SENSATION: WFL  POSTURE: rounded shoulders and forward head  PALPATION: TTP to distal R quad  LOWER EXTREMITY ROM:  Active ROM Right eval Right 09/25/24 Right  10/04/24 Right 10/24/24 Right 11/07/24  Knee flexion 15  60 supine 85A 95 AA 101 AA 102 AA  Knee extension 55    Lacks 5 degrees passive ext  15 deg lag   (Blank rows = not tested)  LOWER EXTREMITY MMT:  MMT Right eval Left eval  Hip flexion    Hip extension    Hip abduction    Hip adduction    Hip internal rotation    Hip external rotation    Knee flexion DNT   Knee extension DNT   Ankle dorsiflexion    Ankle plantarflexion    Ankle inversion    Ankle eversion     (Blank rows = not tested)  LOWER EXTREMITY SPECIAL TESTS:  DNT  FUNCTIONAL TESTS:  30 Second  Sit to Stand: 1 rep with UE  GAIT: Distance walked: 58ft Assistive device utilized: Environmental Consultant - 2 wheeled Level of assistance: SBA Comments: step to, decrease knee ext, antalgic gait R  TREATMENT: [facility] Adult PT Treatment:                                                DATE: 11/07/24 Therapeutic Exercise: Nustep L5 Ue/LE x 6 minutes Supine SLR 5 x 2  (10-15 deg quad lag) Supine QS 3 sec x 5 - increased pain Supine h/s stretch with strap and self over pressure to thigh 10 sec x 5  Supine assisted heel slide with strap x 15 - 102 degrees SAQ 3 # 15 x 3 sec  Seated LAQ YTB 5 x 2  Seated H.s curl YTB  x 2  Updated HEP   Modalities:  GameReady:  Right knee 15 minutes  38 degrees Medium compression     [facility] Adult PT Treatment:                                                DATE: 10/24/24 Therapeutic activity/ neur re-ed Nustep L4 UE/LE x 8 min for ROM Seated heel slide  Seated H.s stretch x 2 LAQ with ball squeeze  x 8 Supine heel slide to march x 8 Unable to SLR SLR with assist from strap x 10 SAQ 5 sec x 10 , 2# x 10  QS 5 sec 5 x 2  Updated and reviewed HEP   Modalities:  GameReady:  Right knee 15 minutes  38 degrees Medium compression  [facility] Adult PT Treatment:                                                DATE: 10/11/24 Therapeutic Exercise: Nustep L3 UE/LE x 8 min for ROM Seated H.s stretch x 5 LAQ with ball squeeze  Supine heel slide with min A from clinician  SAQ 5 sec x 10  Updated and reviewed HEP   Modalities:  GameReady:  Right knee 15 minutes  38 degrees Medium compression LE elevated on wedge, changed to pillow at 7 minutes left   [facility] Adult PT Treatment:  DATE: 10/04/24 Therapeutic Exercise: Nustep L3 UE/LE x 5 min for ROM Seated scoot stretch for knee flexion ROM x2 Seated H.s stretch x 2  LAQ with ball squeeze  Supine heel slide with strap and without  SAQ 5 sec x 10  Assisted SLR with  strap   Modalities:  GameReady:  Right knee 10 minutes  38 degrees Medium compression    [facility] Adult PT Treatment:                                                DATE: 09/25/24 Therapeutic Exercise: Nustep 5 min Heel slide seated with towel slide Self assisted seated knee flexion stretch  foot plant and scoot 10 sec x 3  to 78 degrees Right QS Right heel slide with assist SAQ x 10  PROM knee flexion and EXT   Modalities:  GameReady:  Right knee 10 minutes  38 degrees Medium compression     [facility] Adult PT Treatment:                                                DATE: 09/20/24 Therapeutic Exercise: Nustep L2 8 min for ROM Seated hamstring stretch R 30s x2 FAQs with ball 15x Seated heel slides over towel 15x Heel slides with strap 15x 75d  Modalities:  GameReady:  Right knee 10 minutes  36 degrees Medium compression   [facility] Adult PT Treatment:                                                DATE: 09/14/2024 NuStep lvl 5 UE/LE x 5 min for ROM and activity tolerance Supine QS 2x10 - 5 hold R Supine heel slide with strap 2x10 - 5 hold R (PT Assist) Seated hamstring stretch 2x30 R Seated heel slide 2x10 - 5 hold SAQs 15x Modalities:  GameReady:  Right knee 10 minutes  36 degrees Medium compression  PATIENT EDUCATION:  Education details: eval findings, LEFS, HEP, POC Person educated: Patient Education method: Explanation, Demonstration, and Handouts Education comprehension: verbalized understanding and returned demonstration  HOME EXERCISE PROGRAM: Access Code: [email or web address] URL: [email or web address] Date: 09/14/2024 Prepared by: Alm Kingdom  Exercises - Supine Ankle Pumps  - 8 x daily - 7 x weekly - 30 reps - Supine Quad Set  - 5 x daily - 7 x weekly - 2 sets - 10 reps - 5 sec hold - Supine Heel Slide with Strap  - 5 x daily - 7 x weekly - 2 sets - 10 reps - 5 sec hold - Seated Heel Slide  - 5 x daily - 7 x weekly - 2 sets - 10 reps - 5  sec hold - Seated Hamstring Stretch  - 5 x daily - 7 x weekly - 2-3 reps - 30 sec hold - Seated Long Arc Quad  - 5 x daily - 7 x weekly - 2 sets - 10 reps - ASSISTED Active straight leg raise  - 1 x daily - 7 x weekly - 1 sets - 10 reps - Supine March  - 1 x daily - 7 x  weekly - 1 sets - 10 reps  ASSESSMENT:  CLINICAL IMPRESSION:  Patient continues to be limited by pain severity and post op edema. Updated HEP and provided patient with written copy. She is now able to perform multiple SLR.  We will continue to progress as appropriate.   EVAL: Patient is a 59 y.o. F who was seen today for physical therapy evaluation and treatment s/p R TKA performed by Dr. Jerri on 09/11/2024. Physical findings are consistent with surgery and recovery timeline as pt demonstrates significant R knee ROM and strength deficits. Sharp decline in gait and functional mobility, LEFS score shows severe disability in current performance of home ADLs and community activities. Pt would benefit from skilled PT services working on improving R knee ROM and strength post operatively.   OBJECTIVE IMPAIRMENTS: Abnormal gait, decreased activity tolerance, decreased balance, decreased endurance, decreased mobility, difficulty walking, decreased ROM, decreased strength, and pain.   ACTIVITY LIMITATIONS: lifting, sitting, standing, squatting, stairs, transfers, and locomotion level  PARTICIPATION LIMITATIONS: meal prep, cleaning, driving, shopping, community activity, occupation, and yard work  PERSONAL FACTORS: Time since onset of injury/illness/exacerbation and 1-2 comorbidities: LTKA are also affecting patient's functional outcome.   REHAB POTENTIAL: Good  CLINICAL DECISION MAKING: Evolving/moderate complexity  EVALUATION COMPLEXITY: Moderate   GOALS: Goals reviewed with patient? No  SHORT TERM GOALS: Target date: 10/05/2024   Pt will be compliant and knowledgeable with initial HEP for improved comfort and  carryover Baseline: initial HEP given  Goal status: MET   2.  Pt will self report right knee pain no greater than 7/10 for improved comfort and functional ability Baseline: 10/10 at worst 10/24/24: 8/10 Goal status: ONGOING   LONG TERM GOALS: Target date: 12/08/2024   Pt will improve LEFS to no less than 30/80 as proxy for functional improvement with home ADLs and higher level community activity Baseline: 5/80 Goal status: INITIAL   2.  Pt will self report right knee pain no greater than 4/10 for improved comfort and functional ability Baseline: 10/10 at worst Goal status: INITIAL   3.  Pt will increase 30 Second Sit to Stand rep count to no less than 8 reps for improved balance, strength, and functional mobility Baseline: 1 reps  Goal status: INITIAL   4.  Pt will improve R knee ROM to no less than 3-110 degrees for improved comfort and functional mobility Baseline: see chart Goal status: INITIAL  5.  Pt will be able to kneel and pray without increase in pain and return to sitting independently for improved comfort and function Baseline: unable Goal status: INITIAL    PLAN:  PT FREQUENCY: 2x/week  PT DURATION: 12 weeks  PLANNED INTERVENTIONS: 97164- PT Re-evaluation, 97110-Therapeutic exercises, 97530- Therapeutic activity, 97112- Neuromuscular re-education, 97535- Self Care, [id number]- Manual therapy, [id number]- Gait training, [phone]- Electrical stimulation (unattended), [phone]- Electrical stimulation (manual), 97016- Vasopneumatic device, and Patient/Family education  PLAN FOR NEXT SESSION: assess HEP response, knee ROM, quad strength, gait   Harlene Persons, PTA 11/07/2024 12:04 PM Phone: [phone] Fax: [phone]   "

## 2024-11-14 ENCOUNTER — Ambulatory Visit: Payer: PRIVATE HEALTH INSURANCE | Admitting: Physical Therapy

## 2024-11-14 ENCOUNTER — Encounter: Payer: Self-pay | Admitting: Physical Therapy

## 2024-11-14 DIAGNOSIS — M25561 Pain in right knee: Secondary | ICD-10-CM | POA: Diagnosis not present

## 2024-11-14 DIAGNOSIS — M6281 Muscle weakness (generalized): Secondary | ICD-10-CM

## 2024-11-14 NOTE — Therapy (Signed)
 " OUTPATIENT PHYSICAL THERAPY TREATMENT   Patient Name: Cheryl Bush MRN: [id number] DOB:1965-11-29, 59 y.o., female Today's Date: 11/14/2024  END OF SESSION:  PT End of Session - 11/14/24 1150     Visit Number 9    Number of Visits 24    Date for Recertification  12/08/24    Authorization Type [employer]    PT Start Time 1147    PT Stop Time 1245    PT Time Calculation (min) 58 min             Past Medical History:  Diagnosis Date   Arthritis    High cholesterol    Inverted nipple left   abcess removed from areola, causing some scarring and inverted nipple   Past Surgical History:  Procedure Laterality Date   KNEE SURGERY     left- arthroscopy   TOTAL KNEE ARTHROPLASTY Left 04/23/2023   Procedure: LEFT TOTAL KNEE ARTHROPLASTY;  Surgeon: Jerri Kay HERO, MD;  Location: [facility] OR;  Service: Orthopedics;  Laterality: Left;   TOTAL KNEE ARTHROPLASTY Right 09/11/2024   Procedure: ARTHROPLASTY, KNEE, TOTAL;  Surgeon: Jerri Kay HERO, MD;  Location: [facility] OR;  Service: Orthopedics;  Laterality: Right;   Patient Active Problem List   Diagnosis Date Noted   Status post total right knee replacement 09/11/2024   Status post total left knee replacement 04/23/2023   Primary osteoarthritis of right knee 07/28/2022   Leukopenia 09/07/2016   Mastitis-left, medial aspect 03/15/2013    PCP: Rosalea Rosina SAILOR, PA   REFERRING PROVIDER: Jerri Kay HERO, MD  REFERRING DIAG: M17.11 (ICD-10-CM) - Primary osteoarthritis of right knee   THERAPY DIAG:  Acute pain of right knee  Muscle weakness (generalized)  Rationale for Evaluation and Treatment: Rehabilitation  ONSET DATE: 09/11/2024  SUBJECTIVE:   SUBJECTIVE STATEMENT: Pt reports R knee is still painful. She arrives without AD today.   EVAL: Pt presents to PT s/p R TKA performed by Dr. Jerri on 09/11/2024. Notes that she has been very painful, having difficulty getting around house and has been fairly sedentary. Had L knee replaced in 2024  and did fairly well with this. Wants to improve her knee ROM and pain in order to more comfortably pray.   PERTINENT HISTORY: L TKA, R TKA  PAIN:  Are you having pain?  Yes: NPRS scale: 10/10 Worst: 10/10 Pain location: R knee Pain description: sharp, sore,  Aggravating factors: standing, walking, bending Relieving factors: rest, medication  PRECAUTIONS: None  RED FLAGS: None   WEIGHT BEARING RESTRICTIONS: Yes - WBAT  FALLS:  Has patient fallen in last 6 months? No  LIVING ENVIRONMENT: Lives with: lives with their family Lives in: House/apartment Stairs: Yes: External: 2 steps; on right going up Has following equipment at home: Single point cane and Walker - 2 wheeled  OCCUPATION: [employer]  PLOF: Independent  PATIENT GOALS: improve R knee ROM and decrease pain to make prayer less painful   NEXT MD VISIT: 09/26/2024  OBJECTIVE:  Note: Objective measures were completed at Evaluation unless otherwise noted.  DIAGNOSTIC FINDINGS: See imaging   PATIENT SURVEYS:  LEFS: 5/80 - 09/14/2024    COGNITION: Overall cognitive status: Within functional limits for tasks assessed     SENSATION: WFL  POSTURE: rounded shoulders and forward head  PALPATION: TTP to distal R quad  LOWER EXTREMITY ROM:  Active ROM Right eval Right 09/25/24 Right  10/04/24 Right 10/24/24 Right 11/07/24 Right 11/14/24  Knee flexion 15 60 supine 85A 95 AA  101 AA 102 AA 108 AA  Knee extension 55    Lacks 5 degrees passive ext  15 deg lag    (Blank rows = not tested)  LOWER EXTREMITY MMT:  MMT Right eval Left eval  Hip flexion    Hip extension    Hip abduction    Hip adduction    Hip internal rotation    Hip external rotation    Knee flexion DNT   Knee extension DNT   Ankle dorsiflexion    Ankle plantarflexion    Ankle inversion    Ankle eversion     (Blank rows = not tested)  LOWER EXTREMITY SPECIAL TESTS:  DNT  FUNCTIONAL TESTS:  30 Second Sit to Stand: 1  rep with UE  GAIT: Distance walked: 31ft Assistive device utilized: Environmental Consultant - 2 wheeled Level of assistance: SBA Comments: step to, decrease knee ext, antalgic gait R  TREATMENT: [facility] Adult PT Treatment:                                                DATE: 11/14/24  Nustep L3 UE.LE x 6 minutes  Seated knee ext RTB 10 x 2 Seated H.s curl RTB  10 x 2  Supine heel slides with strap SupineQS Supie SLR SAQ 3# 10 x 2  Passive hamstring stretch Manual knee flexion and ext , PROM knee flexion and ext , patella mobs all directions  Modalities:  GameReady:  Right knee 15 minutes  38 degrees Medium compression     [facility] Adult PT Treatment:                                                DATE: 11/07/24 Therapeutic Exercise: Nustep L5 Ue/LE x 6 minutes Supine SLR 5 x 2  (10-15 deg quad lag) Supine QS 3 sec x 5 - increased pain Supine h/s stretch with strap and self over pressure to thigh 10 sec x 5  Supine assisted heel slide with strap x 15 - 102 degrees SAQ 3 # 15 x 3 sec  Seated LAQ YTB 5 x 2  Seated H.s curl YTB  x 2  Updated HEP   Modalities:  GameReady:  Right knee 15 minutes  38 degrees Medium compression     [facility] Adult PT Treatment:                                                DATE: 10/24/24 Therapeutic activity/ neur re-ed Nustep L4 UE/LE x 8 min for ROM Seated heel slide  Seated H.s stretch x 2 LAQ with ball squeeze  x 8 Supine heel slide to march x 8 Unable to SLR SLR with assist from strap x 10 SAQ 5 sec x 10 , 2# x 10  QS 5 sec 5 x 2  Updated and reviewed HEP   Modalities:  GameReady:  Right knee 15 minutes  38 degrees Medium compression  [facility] Adult PT Treatment:  DATE: 10/11/24 Therapeutic Exercise: Nustep L3 UE/LE x 8 min for ROM Seated H.s stretch x 5 LAQ with ball squeeze  Supine heel slide with min A from clinician  SAQ 5 sec x 10  Updated and reviewed HEP   Modalities:  GameReady:   Right knee 15 minutes  38 degrees Medium compression LE elevated on wedge, changed to pillow at 7 minutes left   [facility] Adult PT Treatment:                                                DATE: 10/04/24 Therapeutic Exercise: Nustep L3 UE/LE x 5 min for ROM Seated scoot stretch for knee flexion ROM x2 Seated H.s stretch x 2  LAQ with ball squeeze  Supine heel slide with strap and without  SAQ 5 sec x 10  Assisted SLR with strap   Modalities:  GameReady:  Right knee 10 minutes  38 degrees Medium compression    [facility] Adult PT Treatment:                                                DATE: 09/25/24 Therapeutic Exercise: Nustep 5 min Heel slide seated with towel slide Self assisted seated knee flexion stretch  foot plant and scoot 10 sec x 3  to 78 degrees Right QS Right heel slide with assist SAQ x 10  PROM knee flexion and EXT   Modalities:  GameReady:  Right knee 10 minutes  38 degrees Medium compression     [facility] Adult PT Treatment:                                                DATE: 09/20/24 Therapeutic Exercise: Nustep L2 8 min for ROM Seated hamstring stretch R 30s x2 FAQs with ball 15x Seated heel slides over towel 15x Heel slides with strap 15x 75d  Modalities:  GameReady:  Right knee 10 minutes  36 degrees Medium compression   [facility] Adult PT Treatment:                                                DATE: 09/14/2024 NuStep lvl 5 UE/LE x 5 min for ROM and activity tolerance Supine QS 2x10 - 5 hold R Supine heel slide with strap 2x10 - 5 hold R (PT Assist) Seated hamstring stretch 2x30 R Seated heel slide 2x10 - 5 hold SAQs 15x Modalities:  GameReady:  Right knee 10 minutes  36 degrees Medium compression  PATIENT EDUCATION:  Education details: eval findings, LEFS, HEP, POC Person educated: Patient Education method: Explanation, Demonstration, and Handouts Education comprehension: verbalized understanding and returned  demonstration  HOME EXERCISE PROGRAM: Access Code: [email or web address] URL: [email or web address] Date: 09/14/2024 Prepared by: Alm Kingdom  Exercises - Supine Ankle Pumps  - 8 x daily - 7 x weekly - 30 reps - Supine Quad Set  - 5 x daily - 7 x weekly - 2 sets - 10 reps - 5 sec hold -  Supine Heel Slide with Strap  - 5 x daily - 7 x weekly - 2 sets - 10 reps - 5 sec hold - Seated Heel Slide  - 5 x daily - 7 x weekly - 2 sets - 10 reps - 5 sec hold - Seated Hamstring Stretch  - 5 x daily - 7 x weekly - 2-3 reps - 30 sec hold - Seated Long Arc Quad  - 5 x daily - 7 x weekly - 2 sets - 10 reps - ASSISTED Active straight leg raise  - 1 x daily - 7 x weekly - 1 sets - 10 reps - Supine March  - 1 x daily - 7 x weekly - 1 sets - 10 reps  ASSESSMENT:  CLINICAL IMPRESSION:  Patient continues to be limited by pain severity and post op edema. Most limiting with terminal knee extension, pain at distal medial knee. Worked on manual to decreased knee restrictions and instructed pt in self patella mobs to reduce tension. She did have improved AA knee flexion today and continues to tolerate SLR but with min quad lag. She was able to progress resistance band for HEP.   EVAL: Patient is a 59 y.o. F who was seen today for physical therapy evaluation and treatment s/p R TKA performed by Dr. Jerri on 09/11/2024. Physical findings are consistent with surgery and recovery timeline as pt demonstrates significant R knee ROM and strength deficits. Sharp decline in gait and functional mobility, LEFS score shows severe disability in current performance of home ADLs and community activities. Pt would benefit from skilled PT services working on improving R knee ROM and strength post operatively.   OBJECTIVE IMPAIRMENTS: Abnormal gait, decreased activity tolerance, decreased balance, decreased endurance, decreased mobility, difficulty walking, decreased ROM, decreased strength, and pain.   ACTIVITY LIMITATIONS: lifting,  sitting, standing, squatting, stairs, transfers, and locomotion level  PARTICIPATION LIMITATIONS: meal prep, cleaning, driving, shopping, community activity, occupation, and yard work  PERSONAL FACTORS: Time since onset of injury/illness/exacerbation and 1-2 comorbidities: LTKA are also affecting patient's functional outcome.   REHAB POTENTIAL: Good  CLINICAL DECISION MAKING: Evolving/moderate complexity  EVALUATION COMPLEXITY: Moderate   GOALS: Goals reviewed with patient? No  SHORT TERM GOALS: Target date: 10/05/2024   Pt will be compliant and knowledgeable with initial HEP for improved comfort and carryover Baseline: initial HEP given  Goal status: MET   2.  Pt will self report right knee pain no greater than 7/10 for improved comfort and functional ability Baseline: 10/10 at worst 10/24/24: 8/10 Goal status: ONGOING   LONG TERM GOALS: Target date: 12/08/2024   Pt will improve LEFS to no less than 30/80 as proxy for functional improvement with home ADLs and higher level community activity Baseline: 5/80 Goal status: INITIAL   2.  Pt will self report right knee pain no greater than 4/10 for improved comfort and functional ability Baseline: 10/10 at worst Goal status: INITIAL   3.  Pt will increase 30 Second Sit to Stand rep count to no less than 8 reps for improved balance, strength, and functional mobility Baseline: 1 reps  Goal status: INITIAL   4.  Pt will improve R knee ROM to no less than 3-110 degrees for improved comfort and functional mobility Baseline: see chart Goal status: INITIAL  5.  Pt will be able to kneel and pray without increase in pain and return to sitting independently for improved comfort and function Baseline: unable Goal status: INITIAL    PLAN:  PT  FREQUENCY: 2x/week  PT DURATION: 12 weeks  PLANNED INTERVENTIONS: 97164- PT Re-evaluation, 97110-Therapeutic exercises, 97530- Therapeutic activity, [id number]- Neuromuscular re-education,  97535- Self Care, 02859- Manual therapy, [phone]- Gait training, [phone]- Electrical stimulation (unattended), [phone]- Electrical stimulation (manual), 97016- Vasopneumatic device, and Patient/Family education  PLAN FOR NEXT SESSION: assess HEP response, knee ROM, quad strength, gait   Harlene Persons, PTA 11/14/24 12:53 PM Phone: [phone] Fax: [phone]   "

## 2024-11-22 ENCOUNTER — Ambulatory Visit: Payer: PRIVATE HEALTH INSURANCE | Admitting: Physical Therapy

## 2024-11-22 ENCOUNTER — Encounter: Payer: Self-pay | Admitting: Physical Therapy

## 2024-11-22 DIAGNOSIS — M25561 Pain in right knee: Secondary | ICD-10-CM | POA: Diagnosis not present

## 2024-11-22 DIAGNOSIS — M6281 Muscle weakness (generalized): Secondary | ICD-10-CM

## 2024-11-22 NOTE — Therapy (Signed)
 " OUTPATIENT PHYSICAL THERAPY TREATMENT   Patient Name: Cheryl Bush MRN: [id number] DOB:02-24-1966, 59 y.o., female Today's Date: 11/22/2024  END OF SESSION:  PT End of Session - 11/22/24 1403     Visit Number 10    Number of Visits 24    Date for Recertification  12/08/24    Authorization Type [employer]    PT Start Time 0201    PT Stop Time 0254    PT Time Calculation (min) 53 min             Past Medical History:  Diagnosis Date   Arthritis    High cholesterol    Inverted nipple left   abcess removed from areola, causing some scarring and inverted nipple   Past Surgical History:  Procedure Laterality Date   KNEE SURGERY     left- arthroscopy   TOTAL KNEE ARTHROPLASTY Left 04/23/2023   Procedure: LEFT TOTAL KNEE ARTHROPLASTY;  Surgeon: Jerri Kay HERO, MD;  Location: [facility] OR;  Service: Orthopedics;  Laterality: Left;   TOTAL KNEE ARTHROPLASTY Right 09/11/2024   Procedure: ARTHROPLASTY, KNEE, TOTAL;  Surgeon: Jerri Kay HERO, MD;  Location: [facility] OR;  Service: Orthopedics;  Laterality: Right;   Patient Active Problem List   Diagnosis Date Noted   Status post total right knee replacement 09/11/2024   Status post total left knee replacement 04/23/2023   Primary osteoarthritis of right knee 07/28/2022   Leukopenia 09/07/2016   Mastitis-left, medial aspect 03/15/2013    PCP: Rosalea Rosina SAILOR, PA   REFERRING PROVIDER: Jerri Kay HERO, MD  REFERRING DIAG: M17.11 (ICD-10-CM) - Primary osteoarthritis of right knee   THERAPY DIAG:  Acute pain of right knee  Muscle weakness (generalized)  Rationale for Evaluation and Treatment: Rehabilitation  ONSET DATE: 09/11/2024  SUBJECTIVE:   SUBJECTIVE STATEMENT: Pt reports R knee is still painful. She arrives without AD today.   EVAL: Pt presents to PT s/p R TKA performed by Dr. Jerri on 09/11/2024. Notes that she has been very painful, having difficulty getting around house and has been fairly sedentary. Had L knee replaced in  2024 and did fairly well with this. Wants to improve her knee ROM and pain in order to more comfortably pray.   PERTINENT HISTORY: L TKA, R TKA  PAIN:  Are you having pain?  Yes: NPRS scale: 10/10 Worst: 10/10 Pain location: R knee Pain description: sharp, sore,  Aggravating factors: standing, walking, bending Relieving factors: rest, medication  PRECAUTIONS: None  RED FLAGS: None   WEIGHT BEARING RESTRICTIONS: Yes - WBAT  FALLS:  Has patient fallen in last 6 months? No  LIVING ENVIRONMENT: Lives with: lives with their family Lives in: House/apartment Stairs: Yes: External: 2 steps; on right going up Has following equipment at home: Single point cane and Walker - 2 wheeled  OCCUPATION: [employer]  PLOF: Independent  PATIENT GOALS: improve R knee ROM and decrease pain to make prayer less painful   NEXT MD VISIT: 09/26/2024  OBJECTIVE:  Note: Objective measures were completed at Evaluation unless otherwise noted.  DIAGNOSTIC FINDINGS: See imaging   PATIENT SURVEYS:  LEFS: 5/80 - 09/14/2024    COGNITION: Overall cognitive status: Within functional limits for tasks assessed     SENSATION: WFL  POSTURE: rounded shoulders and forward head  PALPATION: TTP to distal R quad  LOWER EXTREMITY ROM:  Active ROM Right eval Right 09/25/24 Right  10/04/24 Right 10/24/24 Right 11/07/24 Right 11/14/24  Knee flexion 15 60 supine 85A 95 AA  101 AA 102 AA 108 AA  Knee extension 55    Lacks 5 degrees passive ext  15 deg lag    (Blank rows = not tested)  LOWER EXTREMITY MMT:  MMT Right eval Left eval  Hip flexion    Hip extension    Hip abduction    Hip adduction    Hip internal rotation    Hip external rotation    Knee flexion DNT   Knee extension DNT   Ankle dorsiflexion    Ankle plantarflexion    Ankle inversion    Ankle eversion     (Blank rows = not tested)  LOWER EXTREMITY SPECIAL TESTS:  DNT  FUNCTIONAL TESTS:  30 Second Sit to Stand:  1 rep with UE  GAIT: Distance walked: 95ft Assistive device utilized: Environmental Consultant - 2 wheeled Level of assistance: SBA Comments: step to, decrease knee ext, antalgic gait R  TREATMENT: [facility] Adult PT Treatment:                                                DATE: 11/22/24  Nustep L5 UE/LE x 5 minutes  SAQ 4#  Hamstring stretch QS  Heel slide with strap  Seated RTB knee ext 10 x 2  Seated H.s curl RTB  10 x 2  STS x 10 chair  Manual Therapy: Patella mobs Hook lying knee flex and ext mobs, PROM knee flex and ext   Modalities:  GameReady:  Right knee 15 minutes  38 degrees Medium compression    [facility] Adult PT Treatment:                                                DATE: 11/14/24  Nustep L3 UE.LE x 6 minutes  Seated knee ext RTB 10 x 2 Seated H.s curl RTB  10 x 2  Supine heel slides with strap SupineQS Supie SLR SAQ 3# 10 x 2  Passive hamstring stretch Manual knee flexion and ext , PROM knee flexion and ext , patella mobs all directions  Modalities:  GameReady:  Right knee 15 minutes  38 degrees Medium compression     [facility] Adult PT Treatment:                                                DATE: 11/07/24 Therapeutic Exercise: Nustep L5 Ue/LE x 6 minutes Supine SLR 5 x 2  (10-15 deg quad lag) Supine QS 3 sec x 5 - increased pain Supine h/s stretch with strap and self over pressure to thigh 10 sec x 5  Supine assisted heel slide with strap x 15 - 102 degrees SAQ 3 # 15 x 3 sec  Seated LAQ YTB 5 x 2  Seated H.s curl YTB  x 2  Updated HEP   Modalities:  GameReady:  Right knee 15 minutes  38 degrees Medium compression     [facility] Adult PT Treatment:  DATE: 10/24/24 Therapeutic activity/ neur re-ed Nustep L4 UE/LE x 8 min for ROM Seated heel slide  Seated H.s stretch x 2 LAQ with ball squeeze  x 8 Supine heel slide to march x 8 Unable to SLR SLR with assist from strap x 10 SAQ 5 sec x 10 , 2# x 10  QS 5 sec  5 x 2  Updated and reviewed HEP   Modalities:  GameReady:  Right knee 15 minutes  38 degrees Medium compression  [facility] Adult PT Treatment:                                                DATE: 10/11/24 Therapeutic Exercise: Nustep L3 UE/LE x 8 min for ROM Seated H.s stretch x 5 LAQ with ball squeeze  Supine heel slide with min A from clinician  SAQ 5 sec x 10  Updated and reviewed HEP   Modalities:  GameReady:  Right knee 15 minutes  38 degrees Medium compression LE elevated on wedge, changed to pillow at 7 minutes left   [facility] Adult PT Treatment:                                                DATE: 10/04/24 Therapeutic Exercise: Nustep L3 UE/LE x 5 min for ROM Seated scoot stretch for knee flexion ROM x2 Seated H.s stretch x 2  LAQ with ball squeeze  Supine heel slide with strap and without  SAQ 5 sec x 10  Assisted SLR with strap   Modalities:  GameReady:  Right knee 10 minutes  38 degrees Medium compression    [facility] Adult PT Treatment:                                                DATE: 09/25/24 Therapeutic Exercise: Nustep 5 min Heel slide seated with towel slide Self assisted seated knee flexion stretch  foot plant and scoot 10 sec x 3  to 78 degrees Right QS Right heel slide with assist SAQ x 10  PROM knee flexion and EXT   Modalities:  GameReady:  Right knee 10 minutes  38 degrees Medium compression     [facility] Adult PT Treatment:                                                DATE: 09/20/24 Therapeutic Exercise: Nustep L2 8 min for ROM Seated hamstring stretch R 30s x2 FAQs with ball 15x Seated heel slides over towel 15x Heel slides with strap 15x 75d  Modalities:  GameReady:  Right knee 10 minutes  36 degrees Medium compression   [facility] Adult PT Treatment:                                                DATE: 09/14/2024 NuStep lvl 5 UE/LE x 5 min  for ROM and activity tolerance Supine QS 2x10 - 5 hold R Supine heel slide with strap  2x10 - 5 hold R (PT Assist) Seated hamstring stretch 2x30 R Seated heel slide 2x10 - 5 hold SAQs 15x Modalities:  GameReady:  Right knee 10 minutes  36 degrees Medium compression  PATIENT EDUCATION:  Education details: eval findings, LEFS, HEP, POC Person educated: Patient Education method: Explanation, Demonstration, and Handouts Education comprehension: verbalized understanding and returned demonstration  HOME EXERCISE PROGRAM: Access Code: [email or web address] URL: [email or web address] Date: 09/14/2024 Prepared by: Alm Kingdom  Exercises - Supine Ankle Pumps  - 8 x daily - 7 x weekly - 30 reps - Supine Quad Set  - 5 x daily - 7 x weekly - 2 sets - 10 reps - 5 sec hold - Supine Heel Slide with Strap  - 5 x daily - 7 x weekly - 2 sets - 10 reps - 5 sec hold - Seated Heel Slide  - 5 x daily - 7 x weekly - 2 sets - 10 reps - 5 sec hold - Seated Hamstring Stretch  - 5 x daily - 7 x weekly - 2-3 reps - 30 sec hold - Seated Long Arc Quad  - 5 x daily - 7 x weekly - 2 sets - 10 reps - ASSISTED Active straight leg raise  - 1 x daily - 7 x weekly - 1 sets - 10 reps - Supine March  - 1 x daily - 7 x weekly - 1 sets - 10 reps  ASSESSMENT:  CLINICAL IMPRESSION:  Patient continues to be limited by pain severity and post op edema. Most limiting with terminal knee extension, although improved pain today. Worked on manual to decreased knee restrictions and  patella mobs to reduce tension. Will plan more closed chain and balance over remaining POC. Pt will see MD Feb 3 for follow up. Potential to extend POC.   EVAL: Patient is a 59 y.o. F who was seen today for physical therapy evaluation and treatment s/p R TKA performed by Dr. Jerri on 09/11/2024. Physical findings are consistent with surgery and recovery timeline as pt demonstrates significant R knee ROM and strength deficits. Sharp decline in gait and functional mobility, LEFS score shows severe disability in current performance of home  ADLs and community activities. Pt would benefit from skilled PT services working on improving R knee ROM and strength post operatively.   OBJECTIVE IMPAIRMENTS: Abnormal gait, decreased activity tolerance, decreased balance, decreased endurance, decreased mobility, difficulty walking, decreased ROM, decreased strength, and pain.   ACTIVITY LIMITATIONS: lifting, sitting, standing, squatting, stairs, transfers, and locomotion level  PARTICIPATION LIMITATIONS: meal prep, cleaning, driving, shopping, community activity, occupation, and yard work  PERSONAL FACTORS: Time since onset of injury/illness/exacerbation and 1-2 comorbidities: LTKA are also affecting patient's functional outcome.   REHAB POTENTIAL: Good  CLINICAL DECISION MAKING: Evolving/moderate complexity  EVALUATION COMPLEXITY: Moderate   GOALS: Goals reviewed with patient? No  SHORT TERM GOALS: Target date: 10/05/2024   Pt will be compliant and knowledgeable with initial HEP for improved comfort and carryover Baseline: initial HEP given  Goal status: MET   2.  Pt will self report right knee pain no greater than 7/10 for improved comfort and functional ability Baseline: 10/10 at worst 10/24/24: 8/10 11/22/24: 8/10 Goal status: ONGOING   LONG TERM GOALS: Target date: 12/08/2024   Pt will improve LEFS to no less than 30/80 as proxy for functional improvement with home ADLs and higher level community activity Baseline:  5/80 Goal status: INITIAL   2.  Pt will self report right knee pain no greater than 4/10 for improved comfort and functional ability Baseline: 10/10 at worst Goal status: INITIAL   3.  Pt will increase 30 Second Sit to Stand rep count to no less than 8 reps for improved balance, strength, and functional mobility Baseline: 1 reps  Goal status: INITIAL   4.  Pt will improve R knee ROM to no less than 3-110 degrees for improved comfort and functional mobility Baseline: see chart Goal status: INITIAL  5.   Pt will be able to kneel and pray without increase in pain and return to sitting independently for improved comfort and function Baseline: unable Goal status: INITIAL    PLAN:  PT FREQUENCY: 2x/week  PT DURATION: 12 weeks  PLANNED INTERVENTIONS: 97164- PT Re-evaluation, 97110-Therapeutic exercises, 97530- Therapeutic activity, 97112- Neuromuscular re-education, 97535- Self Care, [id number]- Manual therapy, [id number]- Gait training, [phone]- Electrical stimulation (unattended), [phone]- Electrical stimulation (manual), 97016- Vasopneumatic device, and Patient/Family education  PLAN FOR NEXT SESSION: assess HEP response, knee ROM, quad strength, gait   Harlene Persons, PTA 11/22/24 3:44 PM Phone: [phone] Fax: [phone]   "

## 2024-11-28 ENCOUNTER — Ambulatory Visit: Payer: PRIVATE HEALTH INSURANCE | Admitting: Physician Assistant

## 2024-11-28 DIAGNOSIS — Z96651 Presence of right artificial knee joint: Secondary | ICD-10-CM

## 2024-11-28 MED ORDER — HYDROCODONE-ACETAMINOPHEN 5-325 MG PO TABS: 1.0000 | ORAL_TABLET | Freq: Every day | ORAL | 0 refills | Status: AC | PRN

## 2024-11-29 ENCOUNTER — Encounter: Payer: Self-pay | Admitting: Physical Therapy

## 2024-11-29 ENCOUNTER — Ambulatory Visit: Payer: Self-pay | Admitting: Physical Therapy

## 2024-11-29 DIAGNOSIS — M6281 Muscle weakness (generalized): Secondary | ICD-10-CM

## 2024-11-29 DIAGNOSIS — M25561 Pain in right knee: Secondary | ICD-10-CM

## 2024-11-29 NOTE — Therapy (Signed)
 " OUTPATIENT PHYSICAL THERAPY TREATMENT   Patient Name: Cheryl Bush MRN: [id number] DOB:06-16-1966, 59 y.o., female Today's Date: 11/29/2024  END OF SESSION:  PT End of Session - 11/29/24 0935     Visit Number 11    Number of Visits 24    Date for Recertification  12/08/24    Authorization Type [employer]    PT Start Time [phone]    PT Stop Time 1015    PT Time Calculation (min) 44 min             Past Medical History:  Diagnosis Date   Arthritis    High cholesterol    Inverted nipple left   abcess removed from areola, causing some scarring and inverted nipple   Past Surgical History:  Procedure Laterality Date   KNEE SURGERY     left- arthroscopy   TOTAL KNEE ARTHROPLASTY Left 04/23/2023   Procedure: LEFT TOTAL KNEE ARTHROPLASTY;  Surgeon: Jerri Kay HERO, MD;  Location: [facility] OR;  Service: Orthopedics;  Laterality: Left;   TOTAL KNEE ARTHROPLASTY Right 09/11/2024   Procedure: ARTHROPLASTY, KNEE, TOTAL;  Surgeon: Jerri Kay HERO, MD;  Location: [facility] OR;  Service: Orthopedics;  Laterality: Right;   Patient Active Problem List   Diagnosis Date Noted   Status post total right knee replacement 09/11/2024   Status post total left knee replacement 04/23/2023   Primary osteoarthritis of right knee 07/28/2022   Leukopenia 09/07/2016   Mastitis-left, medial aspect 03/15/2013    PCP: Rosalea Rosina SAILOR, PA   REFERRING PROVIDER: Jerri Kay HERO, MD  REFERRING DIAG: M17.11 (ICD-10-CM) - Primary osteoarthritis of right knee   THERAPY DIAG:  Acute pain of right knee  Muscle weakness (generalized)  Rationale for Evaluation and Treatment: Rehabilitation  ONSET DATE: 09/11/2024  SUBJECTIVE:   SUBJECTIVE STATEMENT: Pt arrives with SPC. Saw MD yesterday who recommended continued PT. Pt is unsure she can RTW due to nature of job duties.     EVAL: Pt presents to PT s/p R TKA performed by Dr. Jerri on 09/11/2024. Notes that she has been very painful, having difficulty getting around house  and has been fairly sedentary. Had L knee replaced in 2024 and did fairly well with this. Wants to improve her knee ROM and pain in order to more comfortably pray.   PERTINENT HISTORY: L TKA, R TKA  PAIN:  Are you having pain?  Yes: NPRS scale: 8/10 Worst: 10/10 Pain location: R knee Pain description: sharp, sore,  Aggravating factors: standing, walking, bending Relieving factors: rest, medication  PRECAUTIONS: None  RED FLAGS: None   WEIGHT BEARING RESTRICTIONS: Yes - WBAT  FALLS:  Has patient fallen in last 6 months? No  LIVING ENVIRONMENT: Lives with: lives with their family Lives in: House/apartment Stairs: Yes: External: 2 steps; on right going up Has following equipment at home: Single point cane and Walker - 2 wheeled  OCCUPATION: [employer]  PLOF: Independent  PATIENT GOALS: improve R knee ROM and decrease pain to make prayer less painful   NEXT MD VISIT: 09/26/2024  OBJECTIVE:  Note: Objective measures were completed at Evaluation unless otherwise noted.  DIAGNOSTIC FINDINGS: See imaging   PATIENT SURVEYS:  LEFS: 5/80 - 09/14/2024    COGNITION: Overall cognitive status: Within functional limits for tasks assessed     SENSATION: WFL  POSTURE: rounded shoulders and forward head  PALPATION: TTP to distal R quad  LOWER EXTREMITY ROM:  Active ROM Right eval Right 09/25/24 Right  10/04/24 Right 10/24/24  Right 11/07/24 Right 11/14/24  Knee flexion 15 60 supine 85A 95 AA 101 AA 102 AA 108 AA  Knee extension 55    Lacks 5 degrees passive ext  15 deg lag    (Blank rows = not tested)  LOWER EXTREMITY MMT:  MMT Right eval Left eval  Hip flexion    Hip extension    Hip abduction    Hip adduction    Hip internal rotation    Hip external rotation    Knee flexion DNT   Knee extension DNT   Ankle dorsiflexion    Ankle plantarflexion    Ankle inversion    Ankle eversion     (Blank rows = not tested)  LOWER EXTREMITY SPECIAL TESTS:   DNT  FUNCTIONAL TESTS:  30 Second Sit to Stand: 1 rep with UE  GAIT: Distance walked: 62ft Assistive device utilized: Environmental Consultant - 2 wheeled Level of assistance: SBA Comments: step to, decrease knee ext, antalgic gait R  TREATMENT: [facility] Adult PT Treatment:                                                DATE: 11/29/24 Therapeutic Exercise: Rec Bike L1 x 5 min Knee ext 5# bilat 6 x 3  Knee flex 15# bilat 6 x 3  Seated Leg press 20# 10 x 3 bilat STS Mat table 6 x 3   Neuromuscular re-ed: Tandem Stance trials 5-6 trials  Therapeutic Activity: 4 inch step up up 4 inch lateral step down Modalities:  GameReady:  Right knee 15 minutes  38 degrees Medium compression     [facility] Adult PT Treatment:                                                DATE: 11/22/24  Nustep L5 UE/LE x 5 minutes  SAQ 4#  Hamstring stretch QS  Heel slide with strap  Seated RTB knee ext 10 x 2  Seated H.s curl RTB  10 x 2  STS x 10 chair  Manual Therapy: Patella mobs Hook lying knee flex and ext mobs, PROM knee flex and ext   Modalities:  GameReady:  Right knee 15 minutes  38 degrees Medium compression          PATIENT EDUCATION:  Education details: eval findings, LEFS, HEP, POC Person educated: Patient Education method: Explanation, Demonstration, and Handouts Education comprehension: verbalized understanding and returned demonstration  HOME EXERCISE PROGRAM: Access Code: [email or web address] URL: [email or web address] Date: 09/14/2024 Prepared by: Alm Kingdom  Exercises - Supine Ankle Pumps  - 8 x daily - 7 x weekly - 30 reps - Supine Quad Set  - 5 x daily - 7 x weekly - 2 sets - 10 reps - 5 sec hold - Supine Heel Slide with Strap  - 5 x daily - 7 x weekly - 2 sets - 10 reps - 5 sec hold - Seated Heel Slide  - 5 x daily - 7 x weekly - 2 sets - 10 reps - 5 sec hold - Seated Hamstring Stretch  - 5 x daily - 7 x weekly - 2-3 reps - 30 sec hold - Seated Long Arc Quad  - 5 x  daily - 7 x weekly -  2 sets - 10 reps - ASSISTED Active straight leg raise  - 1 x daily - 7 x weekly - 1 sets - 10 reps - Supine March  - 1 x daily - 7 x weekly - 1 sets - 10 reps  ASSESSMENT:  CLINICAL IMPRESSION:  Patient continues to be limited by pain severity and post op edema. Continued quad Lag. Saw MD for f/u who recommended continued PT. Worked more in closed chain and balance today as well as reviewing gym machines as she is going to [employer]. Will begin to check progress toward LTG. Pt to see PT for extension of POC in next 2 visits.  She demonstrated improved tolerance to today's session demand.   EVAL: Patient is a 59 y.o. F who was seen today for physical therapy evaluation and treatment s/p R TKA performed by Dr. Jerri on 09/11/2024. Physical findings are consistent with surgery and recovery timeline as pt demonstrates significant R knee ROM and strength deficits. Sharp decline in gait and functional mobility, LEFS score shows severe disability in current performance of home ADLs and community activities. Pt would benefit from skilled PT services working on improving R knee ROM and strength post operatively.   OBJECTIVE IMPAIRMENTS: Abnormal gait, decreased activity tolerance, decreased balance, decreased endurance, decreased mobility, difficulty walking, decreased ROM, decreased strength, and pain.   ACTIVITY LIMITATIONS: lifting, sitting, standing, squatting, stairs, transfers, and locomotion level  PARTICIPATION LIMITATIONS: meal prep, cleaning, driving, shopping, community activity, occupation, and yard work  PERSONAL FACTORS: Time since onset of injury/illness/exacerbation and 1-2 comorbidities: LTKA are also affecting patient's functional outcome.   REHAB POTENTIAL: Good  CLINICAL DECISION MAKING: Evolving/moderate complexity  EVALUATION COMPLEXITY: Moderate   GOALS: Goals reviewed with patient? No  SHORT TERM GOALS: Target date: 10/05/2024   Pt will be compliant and  knowledgeable with initial HEP for improved comfort and carryover Baseline: initial HEP given  Goal status: MET   2.  Pt will self report right knee pain no greater than 7/10 for improved comfort and functional ability Baseline: 10/10 at worst 10/24/24: 8/10 11/22/24: 8/10 Goal status: ONGOING   LONG TERM GOALS: Target date: 12/08/2024   Pt will improve LEFS to no less than 30/80 as proxy for functional improvement with home ADLs and higher level community activity Baseline: 5/80 Goal status: INITIAL   2.  Pt will self report right knee pain no greater than 4/10 for improved comfort and functional ability Baseline: 10/10 at worst Goal status: INITIAL   3.  Pt will increase 30 Second Sit to Stand rep count to no less than 8 reps for improved balance, strength, and functional mobility Baseline: 1 reps  Goal status: INITIAL   4.  Pt will improve R knee ROM to no less than 3-110 degrees for improved comfort and functional mobility Baseline: see chart Goal status: INITIAL  5.  Pt will be able to kneel and pray without increase in pain and return to sitting independently for improved comfort and function Baseline: unable Goal status: INITIAL    PLAN:  PT FREQUENCY: 2x/week  PT DURATION: 12 weeks  PLANNED INTERVENTIONS: 97164- PT Re-evaluation, 97110-Therapeutic exercises, 97530- Therapeutic activity, 97112- Neuromuscular re-education, 97535- Self Care, 02859- Manual therapy, [id number]- Gait training, [phone]- Electrical stimulation (unattended), [phone]- Electrical stimulation (manual), 97016- Vasopneumatic device, and Patient/Family education  PLAN FOR NEXT SESSION: assess HEP response, knee ROM, quad strength, gait   Harlene Persons, PTA 11/29/24 10:25 AM Phone: [phone] Fax: [phone]   "

## 2024-12-06 ENCOUNTER — Ambulatory Visit: Payer: Self-pay | Admitting: Physical Therapy

## 2024-12-13 ENCOUNTER — Ambulatory Visit: Payer: PRIVATE HEALTH INSURANCE

## 2024-12-20 ENCOUNTER — Ambulatory Visit: Payer: PRIVATE HEALTH INSURANCE | Admitting: Physical Therapy

## 2024-12-27 ENCOUNTER — Ambulatory Visit: Payer: Self-pay | Admitting: Physical Therapy

## 2025-03-02 ENCOUNTER — Ambulatory Visit: Payer: PRIVATE HEALTH INSURANCE | Admitting: Physician Assistant
